# Patient Record
Sex: Female | Born: 1995 | Race: Black or African American | Hispanic: No | Marital: Single | Smoking: Never smoker
Health system: Southern US, Community
[De-identification: ages and names within clinical notes are randomized; demographics above are authoritative.]

## PROBLEM LIST (undated history)

## (undated) ENCOUNTER — Emergency Department (HOSPITAL_COMMUNITY): Admission: EM | Payer: Medicaid Other | Source: Home / Self Care

## (undated) DIAGNOSIS — F329 Major depressive disorder, single episode, unspecified: Secondary | ICD-10-CM

## (undated) DIAGNOSIS — F32A Depression, unspecified: Secondary | ICD-10-CM

## (undated) DIAGNOSIS — F319 Bipolar disorder, unspecified: Secondary | ICD-10-CM

## (undated) DIAGNOSIS — E119 Type 2 diabetes mellitus without complications: Secondary | ICD-10-CM

## (undated) DIAGNOSIS — F209 Schizophrenia, unspecified: Secondary | ICD-10-CM

## (undated) HISTORY — PX: NO PAST SURGERIES: SHX2092

---
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
## 2013-07-25 ENCOUNTER — Encounter (HOSPITAL_COMMUNITY): Payer: Self-pay | Admitting: *Deleted

## 2013-07-25 ENCOUNTER — Emergency Department (HOSPITAL_COMMUNITY)
Admission: EM | Admit: 2013-07-25 | Discharge: 2013-07-25 | Disposition: A | Discharge status: Home / Self Care | Payer: Medicaid Other | Attending: Emergency Medicine | Admitting: Emergency Medicine

## 2013-07-25 DIAGNOSIS — F3289 Other specified depressive episodes: Secondary | ICD-10-CM | POA: Insufficient documentation

## 2013-07-25 DIAGNOSIS — Z79899 Other long term (current) drug therapy: Secondary | ICD-10-CM | POA: Insufficient documentation

## 2013-07-25 DIAGNOSIS — F329 Major depressive disorder, single episode, unspecified: Secondary | ICD-10-CM | POA: Insufficient documentation

## 2013-07-25 DIAGNOSIS — B86 Scabies: Secondary | ICD-10-CM | POA: Insufficient documentation

## 2013-07-25 DIAGNOSIS — R21 Rash and other nonspecific skin eruption: Secondary | ICD-10-CM | POA: Insufficient documentation

## 2013-07-25 HISTORY — DX: Depression, unspecified: F32.A

## 2013-07-25 HISTORY — DX: Major depressive disorder, single episode, unspecified: F32.9

## 2013-07-25 MED ORDER — DIPHENHYDRAMINE HCL 25 MG PO TABS: 25.0000 mg | ORAL_TABLET | Freq: Four times a day (QID) | ORAL | Status: DC | PRN

## 2013-07-25 MED ORDER — PERMETHRIN 5 % EX CREA: TOPICAL_CREAM | CUTANEOUS | Status: DC

## 2013-07-25 MED ORDER — FAMOTIDINE 20 MG PO TABS: 20.0000 mg | ORAL_TABLET | Freq: Two times a day (BID) | ORAL | Status: DC

## 2013-07-25 NOTE — ED Notes (Signed)
Pt. Reported rash started on her hands and feet a couple of weeks ago and has spread, nothing she has taken has helped the rash

## 2013-07-25 NOTE — ED Provider Notes (Signed)
CSN: [id number]     Arrival date & time 07/25/13  1157 History  This chart was scribed for Antonietta Breach, PA, working with Leota Jacobsen, MD by Maree Erie, ED Scribe. This patient was seen in room TR07C/TR07C and the patient's care was started at 12:40 PM.    Chief Complaint  Patient presents with  . Rash    Patient is a 17 y.o. female presenting with rash. The history is provided by the patient. No language interpreter was used.  Rash Location:  Foot, hand, leg and torso Hand rash location:  R hand and L hand Torso rash location:  L chest and R chest Foot rash location:  R foot and L foot Onset quality:  Gradual Duration:  1 month Timing:  Constant Progression:  Worsening Chronicity:  New Context: not new detergent/soap and not sick contacts   Ineffective treatments: new soap. Associated symptoms: no fever     HPI Comments: Cindy Leonard is a 17 y.o. female who presents to the Emergency Department complaining of constant, worsening, itching rash on hands, hips, chest and feet that began about a month ago. She reports it started on her hands but has since spread to her hips, chest and feet. Some of the bumps on her hands have been draining and bleeding after scratching them. She denies taking anything for the rash. She started using a new soap after the rash started but it didn't alleviate the rash. She denies a past medical history of eczema. She denies any mouth lesions or fever. She denies recent use of antibiotics. She denies changing lotions or soaps before the rash. She denies any contacts with similar rashes. She denies sleeping anywhere different recently.   Past Medical History  Diagnosis Date  . Depression    History reviewed. No pertinent past surgical history. No family history on file. History  Substance Use Topics  . Smoking status: Never Smoker   . Smokeless tobacco: Not on file  . Alcohol Use: No   OB History   Grav Para Term Preterm Abortions TAB SAB Ect  Mult Living                 Review of Systems  Constitutional: Negative for fever.  HENT: Negative for mouth sores.   Skin: Positive for rash.  All other systems reviewed and are negative.    Allergies  Review of patient's allergies indicates no known allergies.  Home Medications   Current Outpatient Rx  Name  Route  Sig  Dispense  Refill  . ARIPiprazole (ABILIFY IM)   Intramuscular   Inject 1 application into the muscle every 6 (six) weeks.         . diphenhydrAMINE (BENADRYL) 25 MG tablet   Oral   Take 1 tablet (25 mg total) by mouth every 6 (six) hours as needed for itching (Rash).   30 tablet   0   . famotidine (PEPCID) 20 MG tablet   Oral   Take 1 tablet (20 mg total) by mouth 2 (two) times daily.   30 tablet   0   . permethrin (ELIMITE) 5 % cream      Apply to entire body other than face - let sit for 14 hours then wash off, may repeat in 1 week if still having symptoms   60 g   1    Triage Vitals:BP 131/88  Pulse 80  Temp(Src) 97.1 F (36.2 C) (Oral)  Resp 20  SpO2 97%  LMP 07/18/2013  Physical Exam  Nursing note and vitals reviewed. Constitutional: She is oriented to person, place, and time. She appears well-developed and well-nourished. No distress.  HENT:  Head: Normocephalic and atraumatic.  No oral lesions or papules.   Eyes: Conjunctivae and EOM are normal. No scleral icterus.  Neck: Normal range of motion.  Cardiovascular: Normal rate, regular rhythm and intact distal pulses.   Distal radial pulses 2+ bilaterally.  Pulmonary/Chest: Effort normal. No respiratory distress.  Musculoskeletal: Normal range of motion.  Neurological: She is alert and oriented to person, place, and time.  Skin: Skin is warm and dry. No rash noted. She is not diaphoretic. No erythema. No pallor.  Pruritic, papular, mildly erythematous rash appreciated to the dorsal surface of bilateral feet as well as webbed spaces. Rash also appreciated on dorsal aspect and  interphalangeal spaces of hands. Rash dry without scaling. No heat-to-touch, weeping, or drainage.   Psychiatric: She has a normal mood and affect. Her behavior is normal.    ED Course  Procedures (including critical care time)  DIAGNOSTIC STUDIES: Oxygen Saturation is 97% on room air, adequate by my interpretation.    COORDINATION OF CARE:  12:46 PM -Clinical suspicion of scabies. Patient verbalizes understanding and agrees with treatment plan.   Labs Review Labs Reviewed - No data to display  Imaging Review No results found.  MDM   1. Scabies   2. Rash    17 year old otherwise healthy female presents for rash to her bilateral hands and feet x2 weeks. Patient well and nontoxic appearing, hemodynamically stable, and afebrile. Patient denies new topical contacts, ingestion of foods, and antibiotic use. Physical exam findings as above. No oral lesions appreciated. Rash none concerning for SJS, erythema multiforme major, or erythema multiforme minor. Findings most consistent with scabies. Patient appropriate for discharge with primary care followup. Prescription for Elimite given as well as Benadryl and Pepcid for itching. Return precautions discussed and patient agreeable to plan with no unaddressed concerns.  I personally performed the services described in this documentation, which was scribed in my presence. The recorded information has been reviewed and is accurate.     Antonietta Breach, PA-C 07/25/13 1640

## 2013-07-28 NOTE — ED Provider Notes (Signed)
Medical screening examination/treatment/procedure(s) were performed by non-physician practitioner and as supervising physician I was immediately available for consultation/collaboration.  Toy Baker, MD 07/28/13 2312

## 2014-07-12 ENCOUNTER — Emergency Department: Payer: Self-pay | Admitting: Student

## 2014-10-13 ENCOUNTER — Emergency Department (HOSPITAL_COMMUNITY)
Admission: EM | Admit: 2014-10-13 | Discharge: 2014-10-13 | Disposition: A | Discharge status: Home / Self Care | Payer: Medicaid Other | Attending: Emergency Medicine | Admitting: Emergency Medicine

## 2014-10-13 ENCOUNTER — Encounter (HOSPITAL_COMMUNITY): Payer: Self-pay | Admitting: *Deleted

## 2014-10-13 ENCOUNTER — Emergency Department (HOSPITAL_COMMUNITY): Payer: Medicaid Other

## 2014-10-13 DIAGNOSIS — W1839XA Other fall on same level, initial encounter: Secondary | ICD-10-CM | POA: Diagnosis not present

## 2014-10-13 DIAGNOSIS — Z3202 Encounter for pregnancy test, result negative: Secondary | ICD-10-CM | POA: Diagnosis not present

## 2014-10-13 DIAGNOSIS — F329 Major depressive disorder, single episode, unspecified: Secondary | ICD-10-CM | POA: Insufficient documentation

## 2014-10-13 DIAGNOSIS — Y998 Other external cause status: Secondary | ICD-10-CM | POA: Insufficient documentation

## 2014-10-13 DIAGNOSIS — S6991XA Unspecified injury of right wrist, hand and finger(s), initial encounter: Secondary | ICD-10-CM | POA: Diagnosis present

## 2014-10-13 DIAGNOSIS — Z79899 Other long term (current) drug therapy: Secondary | ICD-10-CM | POA: Insufficient documentation

## 2014-10-13 DIAGNOSIS — S6990XA Unspecified injury of unspecified wrist, hand and finger(s), initial encounter: Secondary | ICD-10-CM

## 2014-10-13 DIAGNOSIS — J069 Acute upper respiratory infection, unspecified: Secondary | ICD-10-CM | POA: Diagnosis not present

## 2014-10-13 DIAGNOSIS — Y9389 Activity, other specified: Secondary | ICD-10-CM | POA: Diagnosis not present

## 2014-10-13 DIAGNOSIS — Y9289 Other specified places as the place of occurrence of the external cause: Secondary | ICD-10-CM | POA: Insufficient documentation

## 2014-10-13 LAB — POC URINE PREG, ED: PREG TEST UR: NEGATIVE

## 2014-10-13 MED ORDER — BENZONATATE 100 MG PO CAPS: 100.0000 mg | ORAL_CAPSULE | Freq: Three times a day (TID) | ORAL | Status: DC

## 2014-10-13 MED ORDER — KETOROLAC TROMETHAMINE 60 MG/2ML IM SOLN
60.0000 mg | Freq: Once | INTRAMUSCULAR | Status: AC
  Administered 2014-10-13: 60 mg via INTRAMUSCULAR
  Filled 2014-10-13: qty 2

## 2014-10-13 NOTE — ED Notes (Signed)
Pt c/o sore throat x 1 week, productive cough, fever yesterday. Denies nausea or vomiting or nasal drainage or headaches.  Injured right hand last night, fell and caught self on palmar aspect of both hands. Right 4th and 5th metacarpals tender and swollen

## 2014-10-13 NOTE — ED Notes (Signed)
Patient transported to X-ray 

## 2014-10-13 NOTE — ED Provider Notes (Signed)
CSN: [id number]     Arrival date & time 10/13/14  1133 History  This chart was scribed for non-physician practitioner, Karmen Stabs, PA-C working with Gerhard Munch, MD, by Jarvis Morgan, ED Scribe. This patient was seen in room TR04C/TR04C and the patient's care was started at 12:49 PM.    Chief Complaint  Patient presents with  . Sore Throat  . Hand Injury    The history is provided by the patient. No language interpreter was used.    HPI Comments: Cindy Leonard is a 18 y.o. female with a h/o depression who presents to the Emergency Department with multiple complaints. Pt states she has had a sore throat for 1 week. Pt states she has has strep throat before and this is what it feels like. She is having associated cough productive of thick, green sputum. She had a fever yesterday but it has now resolved. She denies any nausea, vomiting, rhinorrhea, HA or shortness of breath.   Pt has a second complaint of an injury to her right hand that occurred last night. Pt states she was playing "tag" with her friends yesterday and she fell and she tried to catch herself with her right hand. Pt is having associated swelling to the area specifically in her 4th and 5th finger. She iced it with no relief. No h/o asthma or COPD. She does not smoke tobacco but admits to marijuana use. She denies any numbness, tingling weakness, or chills.   Past Medical History  Diagnosis Date  . Depression    History reviewed. No pertinent past surgical history. History reviewed. No pertinent family history. History  Substance Use Topics  . Smoking status: Never Smoker   . Smokeless tobacco: Not on file  . Alcohol Use: No   OB History    No data available     Review of Systems  Constitutional: Negative for fever and chills.  HENT: Positive for congestion and sore throat. Negative for rhinorrhea.   Respiratory: Positive for cough. Negative for shortness of breath.   Gastrointestinal: Negative for nausea and  vomiting.  Musculoskeletal: Positive for joint swelling (right hand) and arthralgias (R hand).  Skin: Negative for color change.  Neurological: Negative for headaches.      Allergies  Review of patient's allergies indicates no known allergies.  Home Medications   Prior to Admission medications   Medication Sig Start Date End Date Taking? Authorizing Provider  ARIPiprazole (ABILIFY IM) Inject 1 application into the muscle every 6 (six) weeks.    Historical Provider, MD  benzonatate (TESSALON) 100 MG capsule Take 1 capsule (100 mg total) by mouth every 8 (eight) hours. 10/13/14   Louann Sjogren, PA-C  diphenhydrAMINE (BENADRYL) 25 MG tablet Take 1 tablet (25 mg total) by mouth every 6 (six) hours as needed for itching (Rash). 07/25/13   Antony Madura, PA-C  famotidine (PEPCID) 20 MG tablet Take 1 tablet (20 mg total) by mouth 2 (two) times daily. 07/25/13   Antony Madura, PA-C  permethrin (ELIMITE) 5 % cream Apply to entire body other than face - let sit for 14 hours then wash off, may repeat in 1 week if still having symptoms 07/25/13   Antony Madura, PA-C   Triage Vitals: BP 111/69 mmHg  Pulse 86  Temp(Src) 98.7 F (37.1 C) (Oral)  Resp 20  SpO2 97%  Physical Exam  Constitutional: She appears well-developed and well-nourished. No distress.  HENT:  Head: Normocephalic and atraumatic.  Nose: Right sinus exhibits no maxillary sinus tenderness  and no frontal sinus tenderness. Left sinus exhibits no maxillary sinus tenderness and no frontal sinus tenderness.  Mouth/Throat: Mucous membranes are normal. Posterior oropharyngeal erythema present. No oropharyngeal exudate or posterior oropharyngeal edema.  Eyes: Conjunctivae and EOM are normal. Right eye exhibits no discharge. Left eye exhibits no discharge.  Neck: Normal range of motion. Neck supple.  Cardiovascular: Normal rate, regular rhythm and normal heart sounds.   Pulses:      Radial pulses are 2+ on the right side, and 2+ on the left  side.  < 3 capillary refill  Pulmonary/Chest: Effort normal and breath sounds normal. No respiratory distress. She has no wheezes. She has no rales.  Abdominal: Soft. Bowel sounds are normal. She exhibits no distension. There is no tenderness.  Musculoskeletal:  Mild swelling and erythema to right dorsal hand, distal to wrist with tenderness No swelling, erythema or tenderness to fingers  Lymphadenopathy:    She has no cervical adenopathy.  Neurological: She is alert.  Skin: Skin is warm and dry. She is not diaphoretic.  Nursing note and vitals reviewed.   ED Course  Procedures (including critical care time)  DIAGNOSTIC STUDIES: Oxygen Saturation is 97% on RA, normal by my interpretation.    COORDINATION OF CARE:    Labs Review Labs Reviewed  POC URINE PREG, ED    Imaging Review Dg Chest 2 View  10/13/2014   CLINICAL DATA:  18 year old female with 1 week history of productive cough. Recent fall.  EXAM: CHEST  2 VIEW  COMPARISON:  None.  FINDINGS: The lungs are clear and negative for focal airspace consolidation, pulmonary edema or suspicious pulmonary nodule. No pleural effusion or pneumothorax. Cardiac and mediastinal contours are within normal limits. No acute fracture or lytic or blastic osseous lesions. The visualized upper abdominal bowel gas pattern is unremarkable.  IMPRESSION: Negative chest x-ray.   Electronically Signed   By: Malachy MoanHeath  McCullough M.D.   On: 10/13/2014 13:53   Dg Hand Complete Right  10/13/2014   CLINICAL DATA:  Patient was running backwards and tripped, tried to break the fall with her right hand. Dorsal right hand pain and swelling. Initial encounter.  EXAM: RIGHT HAND - COMPLETE 3+ VIEW  COMPARISON:  None.  FINDINGS: Mild soft tissue swelling. No evidence of acute fracture or dislocation. Joint spaces well preserved. Well-preserved bone mineral density. No intrinsic osseous abnormalities.  IMPRESSION: No osseous abnormality.   Electronically Signed   By:  Hulan Saashomas  Lawrence M.D.   On: 10/13/2014 13:53     EKG Interpretation None      Meds given in ED:  Medications  ketorolac (TORADOL) injection 60 mg (60 mg Intramuscular Given 10/13/14 1330)    Discharge Medication List as of 10/13/2014  2:01 PM    START taking these medications   Details  benzonatate (TESSALON) 100 MG capsule Take 1 capsule (100 mg total) by mouth every 8 (eight) hours., Starting 10/13/2014, Until Discontinued, Print          MDM   Final diagnoses:  Hand injury  URI (upper respiratory infection)   Pt CXR negative for acute infiltrate. I doubt strep throat due and will not obtain strep screen due to low probability with [facility] criteria. Patients symptoms are consistent with URI, likely viral etiology. Discussed that antibiotics are not indicated for viral infections. Pt will be discharged with symptomatic treatment as well as a cough suppressant.  Verbalizes understanding and is agreeable with plan. Pt is hemodynamically stable & in NAD prior to  dc.  Patient X-Ray negative for obvious fracture or dislocation. Pain managed in ED. Pt advised to follow up with PCP/wellness center for persistent symptoms. Patient given brace while in ED, conservative therapy recommended and discussed. Patient will be dc home & is agreeable with above plan.  Discussed return precautions with patient. Discussed all results and patient verbalizes understanding and agrees with plan.  I personally performed the services described in this documentation, which was scribed in my presence. The recorded information has been reviewed and is accurate.   Louann SjogrenVictoria L Shenouda Genova, PA-C 10/13/14 1814  Gerhard Munchobert Lockwood, MD 10/14/14 [phone]

## 2014-10-13 NOTE — Discharge Instructions (Signed)
Return to the emergency room with worsening of symptoms, new symptoms or with symptoms that are concerning. , especially fevers, stiff neck, worsening headache, nausea/vomiting, visual changes or slurred speech, chest pain, shortness of breath, cough with thick colored mucous or blood Drink plenty of fluids with electrolytes especially Gatorade. OTC cold medications such as mucinex, nyquil, dayquil are recommended. Chloraseptic for sore throat. RICE: Rest, Ice (three cycles of 20 mins on, 20mins off at least twice a [place]), compression/brace, elevation. Heating pad works well for back pain. Ibuprofen 400mg  (2 tablets 200mg ) every 5-6 hours for 3-5 days and then as needed for pain. Follow up with PCP if symptoms worsen or are persistent.   Cough, Adult  A cough is a reflex that helps clear your throat and airways. It can help heal the body or may be a reaction to an irritated airway. A cough may only last 2 or 3 weeks (acute) or may last more than 8 weeks (chronic).  CAUSES Acute cough:  Viral or bacterial infections. Chronic cough:  Infections.  Allergies.  Asthma.  Post-nasal drip.  Smoking.  Heartburn or acid reflux.  Some medicines.  Chronic lung problems (COPD).  Cancer. SYMPTOMS   Cough.  Fever.  Chest pain.  Increased breathing rate.  High-pitched whistling sound when breathing (wheezing).  Colored mucus that you cough up (sputum). TREATMENT   A bacterial cough may be treated with antibiotic medicine.  A viral cough must run its course and will not respond to antibiotics.  Your caregiver may recommend other treatments if you have a chronic cough. HOME CARE INSTRUCTIONS   Only take over-the-counter or prescription medicines for pain, discomfort, or fever as directed by your caregiver. Use cough suppressants only as directed by your caregiver.  Use a cold steam vaporizer or humidifier in your bedroom or home to help loosen secretions.  Sleep in a  semi-upright position if your cough is worse at night.  Rest as needed.  Stop smoking if you smoke. SEEK IMMEDIATE MEDICAL CARE IF:   You have pus in your sputum.  Your cough starts to worsen.  You cannot control your cough with suppressants and are losing sleep.  You begin coughing up blood.  You have difficulty breathing.  You develop pain which is getting worse or is uncontrolled with medicine.  You have a fever. MAKE SURE YOU:   Understand these instructions.  Will watch your condition.  Will get help right away if you are not doing well or get worse. Document Released: 04/17/2011 Document Revised: 01/11/2012 Document Reviewed: 04/17/2011 [facility] Patient Information 2015 [employer] [place]. This information is not intended to replace advice given to you by your health care provider. Make sure you discuss any questions you have with your health care provider.

## 2016-07-23 ENCOUNTER — Emergency Department (HOSPITAL_COMMUNITY)
Admission: EM | Admit: 2016-07-23 | Discharge: 2016-07-23 | Disposition: A | Discharge status: Home / Self Care | Payer: Medicaid Other | Attending: Emergency Medicine | Admitting: Emergency Medicine

## 2016-07-23 ENCOUNTER — Encounter (HOSPITAL_COMMUNITY): Payer: Self-pay | Admitting: Emergency Medicine

## 2016-07-23 DIAGNOSIS — Z79899 Other long term (current) drug therapy: Secondary | ICD-10-CM | POA: Insufficient documentation

## 2016-07-23 DIAGNOSIS — R112 Nausea with vomiting, unspecified: Secondary | ICD-10-CM | POA: Diagnosis not present

## 2016-07-23 DIAGNOSIS — R197 Diarrhea, unspecified: Secondary | ICD-10-CM | POA: Insufficient documentation

## 2016-07-23 DIAGNOSIS — R1084 Generalized abdominal pain: Secondary | ICD-10-CM | POA: Diagnosis present

## 2016-07-23 LAB — COMPREHENSIVE METABOLIC PANEL
ALT: 33 U/L (ref 14–54)
ANION GAP: 7 (ref 5–15)
AST: 23 U/L (ref 15–41)
Albumin: 4.1 g/dL (ref 3.5–5.0)
Alkaline Phosphatase: 64 U/L (ref 38–126)
BILIRUBIN TOTAL: 0.4 mg/dL (ref 0.3–1.2)
BUN: 22 mg/dL — ABNORMAL HIGH (ref 6–20)
CO2: 27 mmol/L (ref 22–32)
Calcium: 9.9 mg/dL (ref 8.9–10.3)
Chloride: 108 mmol/L (ref 101–111)
Creatinine, Ser: 1.21 mg/dL — ABNORMAL HIGH (ref 0.44–1.00)
GFR calc Af Amer: >60 mL/min (ref >60)
Glucose, Bld: 98 mg/dL (ref 65–99)
POTASSIUM: 4 mmol/L (ref 3.5–5.1)
Sodium: 142 mmol/L (ref 135–145)
TOTAL PROTEIN: 7.9 g/dL (ref 6.5–8.1)

## 2016-07-23 LAB — URINALYSIS, ROUTINE W REFLEX MICROSCOPIC
Bilirubin Urine: NEGATIVE
Glucose, UA: NEGATIVE mg/dL
Hgb urine dipstick: NEGATIVE
Ketones, ur: NEGATIVE mg/dL
NITRITE: NEGATIVE
PH: 5.5 (ref 5.0–8.0)
Protein, ur: NEGATIVE mg/dL
SPECIFIC GRAVITY, URINE: 1.02 (ref 1.005–1.030)

## 2016-07-23 LAB — CBC
HEMATOCRIT: 39.9 % (ref 36.0–46.0)
HEMOGLOBIN: 13.4 g/dL (ref 12.0–15.0)
MCH: 27.7 pg (ref 26.0–34.0)
MCHC: 33.6 g/dL (ref 30.0–36.0)
MCV: 82.6 fL (ref 78.0–100.0)
Platelets: 332 10*3/uL (ref 150–400)
RBC: 4.83 MIL/uL (ref 3.87–5.11)
RDW: 13.8 % (ref 11.5–15.5)
WBC: 11.9 10*3/uL — AB (ref 4.0–10.5)

## 2016-07-23 LAB — URINE MICROSCOPIC-ADD ON: RBC / HPF: NONE SEEN RBC/hpf (ref 0–5)

## 2016-07-23 LAB — POC URINE PREG, ED: Preg Test, Ur: NEGATIVE

## 2016-07-23 LAB — LIPASE, BLOOD: Lipase: 37 U/L (ref 11–51)

## 2016-07-23 MED ORDER — ONDANSETRON 4 MG PO TBDP: 4.0000 mg | ORAL_TABLET | Freq: Three times a day (TID) | ORAL | 0 refills | Status: DC | PRN

## 2016-07-23 MED ORDER — ONDANSETRON 4 MG PO TBDP
4.0000 mg | ORAL_TABLET | Freq: Once | ORAL | Status: AC | PRN
  Administered 2016-07-23: 4 mg via ORAL
  Filled 2016-07-23: qty 1

## 2016-07-23 MED ORDER — DIPHENOXYLATE-ATROPINE 2.5-0.025 MG PO TABS: 1.0000 | ORAL_TABLET | Freq: Four times a day (QID) | ORAL | 0 refills | Status: DC | PRN

## 2016-07-23 NOTE — ED Triage Notes (Signed)
Pt from home with c/o abdominal pain on her left upper quadrant, pt also has c/o emesis x 3 and diarrhea x 2. Pt has a low grade fever of 99.3 at time of assessment. Pt states symptoms have been present x 1 week

## 2016-07-23 NOTE — ED Notes (Signed)
Pt's family members came to desk asking for assistance in bathroom Pt was on the floor Pt sat on floor because "she's hot." Pt able to stand and walk back out to lobby w/o assistance Pt advised to remove extra clothing Pt given ice-pack to help cool herself

## 2016-07-23 NOTE — Discharge Instructions (Signed)
You were seen in the emergency room for evaluation of abdominal pain, nausea, vomiting, and diarrhea. You likely have a virus. Follow up with your primary care provider as soon as possible for a follow up. If you do not have one you should have an assigned primary care provider listed on your medicaid card. Take medication as prescribed as needed. Drink plenty of water to stay hydrated. Return to the ER for new or worsening symptoms.

## 2016-07-23 NOTE — ED Provider Notes (Signed)
[facility]-EMERGENCY DEPT Provider Note   CSN: [id number] Arrival date & time: 07/23/16  1947   History   Chief Complaint Chief Complaint  Patient presents with  . Emesis  . Abdominal Pain  . Diarrhea   HPI  Cindy Leonard is an 20 y.o. female with no significant PMH who presents to the ED for evaluation of abdominal pain, nausea, vomiting, and diarrhea. She states her symptoms started about one week ago. She states she has diffuse abdominal cramping pain. States she has been nauseated and has thrown up twice over the past week. She states each night she has at least one episode of loose watery non bloody stools, sometimes another one or two over the course of the [place]. She has been able to keep water and food down. She is hungry in the ED. She states a few times she has felt very hot and think she has a fever but is not sure. Denies dysuria or increased urinary frequency/urgency. States she went to [place] [place] over [place] weekend otherwise denies recent travel. Denies sick contacts. Has not tried anything for the pain.  Past Medical History:  Diagnosis Date  . Depression     There are no active problems to display for this patient.   History reviewed. No pertinent surgical history.  OB History    No data available       Home Medications    Prior to Admission medications   Medication Sig Start Date End Date Taking? Authorizing Provider  diphenhydrAMINE (BENADRYL) 25 MG tablet Take 1 tablet (25 mg total) by mouth every 6 (six) hours as needed for itching (Rash). Patient taking differently: Take 25 mg by mouth every 6 (six) hours as needed for itching, allergies or sleep.  07/25/13  Yes Antony Madura, PA-C  diphenoxylate-atropine (LOMOTIL) 2.5-0.025 MG tablet Take 1 tablet by mouth 4 (four) times daily as needed for diarrhea or loose stools. 07/23/16   Ace Gins Ahmadou Bolz, PA-C  ondansetron (ZOFRAN ODT) 4 MG disintegrating tablet Take 1 tablet (4 mg total) by mouth every 8 (eight) hours as  needed for nausea or vomiting. 07/23/16   Carlene Coria, PA-C    Family History No family history on file.  Social History Social History  Substance Use Topics  . Smoking status: Never Smoker  . Smokeless tobacco: Never Used  . Alcohol use No     Allergies   Review of patient's allergies indicates no known allergies.   Review of Systems Review of Systems  All other systems reviewed and are negative.    Physical Exam Updated Vital Signs BP 123/84 (BP Location: Left Arm)   Pulse 78   Temp 99.3 F (37.4 C) (Oral)   Resp 20   Ht 5\' 5"  (1.651 m)   Wt (!) 148.3 kg   SpO2 98%   BMI 54.42 kg/m   Physical Exam  Constitutional: She is oriented to person, place, and time.  Obese, NAD  HENT:  Right Ear: External ear normal.  Left Ear: External ear normal.  Nose: Nose normal.  Mouth/Throat: Oropharynx is clear and moist. No oropharyngeal exudate.  Eyes: Conjunctivae are normal.  Neck: Neck supple.  Cardiovascular: Normal rate, regular rhythm, normal heart sounds and intact distal pulses.   Pulmonary/Chest: Effort normal and breath sounds normal. No respiratory distress. She has no wheezes.  Abdominal: Soft. Bowel sounds are normal. She exhibits no distension. There is no tenderness. There is no rebound and no guarding.  Musculoskeletal: She exhibits no  edema.  Lymphadenopathy:    She has no cervical adenopathy.  Neurological: She is alert and oriented to person, place, and time. No cranial nerve deficit.  Skin: Skin is warm and dry.  Psychiatric: She has a normal mood and affect.  Nursing note and vitals reviewed.    ED Treatments / Results  Labs (all labs ordered are listed, but only abnormal results are displayed) Labs Reviewed  COMPREHENSIVE METABOLIC PANEL - Abnormal; Notable for the following:       Result Value   BUN 22 (*)    Creatinine, Ser 1.21 (*)    All other components within normal limits  CBC - Abnormal; Notable for the following:    WBC 11.9 (*)     All other components within normal limits  URINALYSIS, ROUTINE W REFLEX MICROSCOPIC (NOT [facility]) - Abnormal; Notable for the following:    APPearance CLOUDY (*)    Leukocytes, UA SMALL (*)    All other components within normal limits  URINE MICROSCOPIC-ADD ON - Abnormal; Notable for the following:    Squamous Epithelial / LPF 6-30 (*)    Bacteria, UA MANY (*)    All other components within normal limits  URINE CULTURE  LIPASE, BLOOD  POC URINE PREG, ED    EKG  EKG Interpretation None       Radiology No results found.  Procedures Procedures (including critical care time)  Medications Ordered in ED Medications  ondansetron (ZOFRAN-ODT) disintegrating tablet 4 mg (4 mg Oral Given 07/23/16 2030)     Initial Impression / Assessment and Plan / ED Course  I have reviewed the triage vital signs and the nursing notes.  Pertinent labs & imaging results that were available during my care of the patient were reviewed by me and considered in my medical decision making (see chart for details).  Clinical Course    Pt feels well in the room. She is hungry. Denies nausea. Denies abdominal pain. She has a nonfocal exam. She has some renal insufficiency on labs today. Unclear of baseline. Instructed pt to f/u with pcp for re-check. She has a UA with 6-30 WBC, many bacteria. Also evidence of contamination. Pt denies urinary symptoms. She has no CVA tenderness. Repeat temp in room is 98.11F. Will hold off on antibiotic therapy given asymptomatic bacturia. Will send for culture. In the meantime will give rx for supportive meds. Suspect viral etiology. Doubt appendicitis, cholecystitis, SBO, diverticulitis, or other intra-abdominal etiology requiring further workup and imaging. Pt nontoxic appearing and tolerating PO. Resource given to assist in establishing PCP. ER return precautions given.  Final Clinical Impressions(s) / ED Diagnoses   Final diagnoses:  Nausea vomiting and diarrhea     New Prescriptions New Prescriptions   DIPHENOXYLATE-ATROPINE (LOMOTIL) 2.5-0.025 MG TABLET    Take 1 tablet by mouth 4 (four) times daily as needed for diarrhea or loose stools.   ONDANSETRON (ZOFRAN ODT) 4 MG DISINTEGRATING TABLET    Take 1 tablet (4 mg total) by mouth every 8 (eight) hours as needed for nausea or vomiting.     Carlene CoriaSerena Y Tarun Patchell, PA-C 07/23/16 2319    Melene Planan Floyd, DO 07/23/16 2320

## 2016-07-23 NOTE — ED Notes (Signed)
Bed: WA08 Expected date:  Expected time:  Means of arrival:  Comments: Cleaning

## 2016-07-23 NOTE — ED Notes (Signed)
While in the lobby, pt's family asked many times when patient would get back to a room. ED staff gave the best estimated answered and explained the process of the ED. Then pt's mother told the pt, "they will care about you when you fall out." Pt had stable vitals and is ambulatory with no assistance.

## 2016-07-26 LAB — URINE CULTURE: Culture: >=100000 — AB

## 2016-07-27 ENCOUNTER — Telehealth (HOSPITAL_BASED_OUTPATIENT_CLINIC_OR_DEPARTMENT_OTHER): Payer: Self-pay | Admitting: Emergency Medicine

## 2016-07-27 NOTE — Telephone Encounter (Signed)
Post ED Visit - Positive Culture Follow-up  Culture report reviewed by antimicrobial stewardship pharmacist:  []  Enzo BiNathan Batchelder, Pharm.D. []  Celedonio MiyamotoJeremy Frens, Pharm.D., BCPS []  Garvin FilaMike Maccia, Pharm.D. []  Georgina PillionElizabeth Martin, Pharm.D., BCPS []  EmpireMinh Pham, [address] Rainbow BoulevardPharm.D., BCPS, AAHIVP []  Estella HuskMichelle Turner, Pharm.D., BCPS, AAHIVP []  Tennis Mustassie Stewart, Pharm.D. []  Sherle Poeob Vincent, [place]. Casilda Carlsaylor Stone PharmD  Positive urine culture Treated with none, asymptomatic,  no further patient follow-up is required at this time.  Berle MullMiller, Jamel Dunton 07/27/2016, 10:47 AM

## 2016-12-23 ENCOUNTER — Encounter (HOSPITAL_COMMUNITY): Payer: Self-pay

## 2016-12-23 ENCOUNTER — Emergency Department (HOSPITAL_COMMUNITY)
Admission: EM | Admit: 2016-12-23 | Discharge: 2016-12-23 | Disposition: A | Discharge status: Home / Self Care | Payer: Medicaid Other | Attending: Emergency Medicine | Admitting: Emergency Medicine

## 2016-12-23 DIAGNOSIS — N939 Abnormal uterine and vaginal bleeding, unspecified: Secondary | ICD-10-CM | POA: Diagnosis not present

## 2016-12-23 HISTORY — DX: Schizophrenia, unspecified: F20.9

## 2016-12-23 HISTORY — DX: Bipolar disorder, unspecified: F31.9

## 2016-12-23 LAB — WET PREP, GENITAL
Clue Cells Wet Prep HPF POC: NONE SEEN
SPERM: NONE SEEN
TRICH WET PREP: NONE SEEN
Yeast Wet Prep HPF POC: NONE SEEN

## 2016-12-23 LAB — CBC
HCT: 40.6 % (ref 36.0–46.0)
Hemoglobin: 13.5 g/dL (ref 12.0–15.0)
MCH: 27.3 pg (ref 26.0–34.0)
MCHC: 33.3 g/dL (ref 30.0–36.0)
MCV: 82.2 fL (ref 78.0–100.0)
PLATELETS: 324 10*3/uL (ref 150–400)
RBC: 4.94 MIL/uL (ref 3.87–5.11)
RDW: 14.5 % (ref 11.5–15.5)
WBC: 10.1 10*3/uL (ref 4.0–10.5)

## 2016-12-23 NOTE — ED Provider Notes (Signed)
[facility]-EMERGENCY DEPT Provider Note   CSN: [id number] Arrival date & time: 12/23/16  1538     History   Chief Complaint Chief Complaint  Patient presents with  . Vaginal Bleeding    HPI Cindy Leonard is a 21 y.o. female who presents to the ED with complaint of vaginal bleeding during sexual intercourse x one month. Patient reports no bleeding other than with sex. She denies pain with intercourse. She denies n/v/d. She denies having had STI's. Last pap smear less than one year ago at the health department and was normal. She denies any other problems today.  The history is provided by the patient. No language interpreter was used.  Vaginal Bleeding  Primary symptoms include vaginal bleeding.  Primary symptoms include no dysuria. There has been no fever. The current episode started more than 1 week ago. Episode frequency: only with intercourse. The symptoms occur after intercourse. She is not pregnant. Pertinent negatives include no abdominal swelling, no abdominal pain, no diarrhea, no nausea, no vomiting and no frequency. She has tried nothing for the symptoms. Sexual activity: sexually active. There is no concern regarding sexually transmitted diseases. She uses nothing for contraception.    Past Medical History:  Diagnosis Date  . Bipolar 1 disorder (HCC)   . Depression   . Schizophrenia (HCC)     There are no active problems to display for this patient.   History reviewed. No pertinent surgical history.  OB History    No data available       Home Medications    Prior to Admission medications   Medication Sig Start Date End Date Taking? Authorizing Provider  diphenoxylate-atropine (LOMOTIL) 2.5-0.025 MG tablet Take 1 tablet by mouth 4 (four) times daily as needed for diarrhea or loose stools. 07/23/16   Ace GinsSerena Y Sam, PA-C  ondansetron (ZOFRAN ODT) 4 MG disintegrating tablet Take 1 tablet (4 mg total) by mouth every 8 (eight) hours as needed for nausea or vomiting.  07/23/16   Carlene CoriaSerena Y Sam, PA-C    Family History History reviewed. No pertinent family history.  Social History Social History  Substance Use Topics  . Smoking status: Never Smoker  . Smokeless tobacco: Never Used  . Alcohol use No     Allergies   Patient has no known allergies.   Review of Systems Review of Systems  Constitutional: Negative for chills and fever.  HENT: Negative for congestion and sore throat.   Respiratory: Negative for cough and shortness of breath.   Gastrointestinal: Negative for abdominal pain, diarrhea, nausea and vomiting.  Genitourinary: Positive for vaginal bleeding. Negative for dysuria, frequency and vaginal discharge.  Musculoskeletal: Negative for back pain.  Skin: Negative for rash.  Neurological: Negative for headaches.  Psychiatric/Behavioral: Negative for sleep disturbance.     Physical Exam Updated Vital Signs BP 139/91   Pulse 75   Temp 97.8 F (36.6 C) (Oral)   Resp 18   Ht 5\' 9"  (1.753 m)   Wt (!) 152 kg   LMP 12/06/2016 (Exact Date)   SpO2 100%   BMI 49.47 kg/m   Physical Exam  Constitutional: She is oriented to person, place, and time. No distress.  Morbidly Obese female with Elevated BP  HENT:  Head: Normocephalic.  Eyes: EOM are normal.  Neck: Neck supple.  Cardiovascular: Normal rate and regular rhythm.   Pulmonary/Chest: Effort normal. She has no wheezes. She has no rales.  Abdominal: Soft. Bowel sounds are normal. There is no tenderness.  Genitourinary:  Genitourinary Comments: External genitalia without lesions, vaginal vault with scant blood, clean vault with fox swab and no laceration or bleeding from vaginal wall noted. Cervix without lesions, no CMT, no adnexal tenderness, unable to palpate uterus due to patient habitus.   Musculoskeletal: Normal range of motion.  Neurological: She is alert and oriented to person, place, and time. No cranial nerve deficit.  Skin: Skin is warm and dry.  Psychiatric: She has a  normal mood and affect. Her behavior is normal.  Nursing note and vitals reviewed.    ED Treatments / Results  Labs (all labs ordered are listed, but only abnormal results are displayed) Labs Reviewed  WET PREP, GENITAL - Abnormal; Notable for the following:       Result Value   WBC, Wet Prep HPF POC FEW (*)    All other components within normal limits  CBC  RPR  HIV ANTIBODY (ROUTINE TESTING)  POC URINE PREG, ED  GC/CHLAMYDIA PROBE AMP ([place]) NOT AT [facility]  Procedures Procedures (including critical care time)  Medications Ordered in ED Medications - No data to display   Initial Impression / Assessment and Plan / ED Course  I have reviewed the triage vital signs and the nursing notes.  Pertinent lab results that were available during my care of the patient were reviewed by me and considered in my medical decision making (see chart for details).   Final Clinical Impressions(s) / ED Diagnoses  21 y.o. female with bleeding during sexual intercourse stable for d/c without hemorrhage, fever, abdominal pain or other problems. I discussed with the patient bleeding possibly due to trama to the cervix during intercourse and she states that is what they told her when she went to the health department about it. Discussed that there are no visible lacerations to the cervix or vaginal wall. Cultures pending. Patient to return to health department for routine care. She will go to [facility] if symptoms worsen. She will also f/u for elevated BP. Final diagnoses:  Vaginal bleeding    New Prescriptions Current Discharge Medication List       [facility], NP 12/23/16 1745    Arby Barrette, MD 12/24/16 [phone]

## 2016-12-23 NOTE — ED Triage Notes (Signed)
Pt endorses "Every time I have sex with my boyfriend I began having vaginal bleeding" Denies clots. Pt states that "It bleeds for a short time and then it goes away" VSS. Denies pain.

## 2016-12-23 NOTE — Discharge Instructions (Signed)
We did not find a reason for your bleeding today. It is important that you follow up with the health department or with your GYN for additional evaluation of your bleeding. If your symptoms worsen, you experience heavy bleeding, abdominal pain or other problems [facility].

## 2016-12-24 LAB — GC/CHLAMYDIA PROBE AMP (~~LOC~~) NOT AT ARMC
CHLAMYDIA, DNA PROBE: NEGATIVE
Neisseria Gonorrhea: NEGATIVE

## 2016-12-24 LAB — RPR: RPR: NONREACTIVE

## 2016-12-24 LAB — HIV ANTIBODY (ROUTINE TESTING W REFLEX): HIV SCREEN 4TH GENERATION: NONREACTIVE

## 2017-02-08 ENCOUNTER — Encounter (HOSPITAL_COMMUNITY): Payer: Self-pay | Admitting: Emergency Medicine

## 2017-02-08 ENCOUNTER — Emergency Department (HOSPITAL_COMMUNITY)
Admission: EM | Admit: 2017-02-08 | Discharge: 2017-02-08 | Disposition: A | Discharge status: Home / Self Care | Payer: Medicaid Other | Attending: Emergency Medicine | Admitting: Emergency Medicine

## 2017-02-08 DIAGNOSIS — N938 Other specified abnormal uterine and vaginal bleeding: Secondary | ICD-10-CM | POA: Insufficient documentation

## 2017-02-08 DIAGNOSIS — N939 Abnormal uterine and vaginal bleeding, unspecified: Secondary | ICD-10-CM | POA: Diagnosis present

## 2017-02-08 DIAGNOSIS — Z79899 Other long term (current) drug therapy: Secondary | ICD-10-CM | POA: Insufficient documentation

## 2017-02-08 LAB — WET PREP, GENITAL
CLUE CELLS WET PREP: NONE SEEN
Sperm: NONE SEEN
TRICH WET PREP: NONE SEEN
YEAST WET PREP: NONE SEEN

## 2017-02-08 LAB — I-STAT BETA HCG BLOOD, ED (MC, WL, AP ONLY)

## 2017-02-08 MED ORDER — MEDROXYPROGESTERONE ACETATE 5 MG PO TABS: 5.0000 mg | ORAL_TABLET | Freq: Every day | ORAL | 0 refills | Status: DC

## 2017-02-08 MED ORDER — IBUPROFEN 600 MG PO TABS: 600.0000 mg | ORAL_TABLET | Freq: Three times a day (TID) | ORAL | 0 refills | Status: AC

## 2017-02-08 NOTE — ED Triage Notes (Signed)
Pt states she is having vaginal bleeding and lower abd pain  Pt states she had a normal period March 5th but this month it is a few days late, heavy with clots  Pt states she has gone through 4 pads in the past 12 hours

## 2017-02-08 NOTE — ED Provider Notes (Signed)
[facility]-EMERGENCY DEPT Provider Note   CSN: [id number] Arrival date & time: 02/08/17  1901     History   Chief Complaint Chief Complaint  Patient presents with  . Vaginal Bleeding    HPI Cindy Leonard is a 21 y.o. female.  The history is provided by the patient.  Vaginal Bleeding  Primary symptoms include vaginal bleeding.  Primary symptoms include no discharge, no pelvic pain, no genital pain, no genital rash, no dysuria. There has been no fever. This is a new problem. Episode onset: One month ago. The problem occurs constantly. The problem has not changed since onset.The symptoms occur spontaneously and during menstruation. She is not pregnant. She has not missed her period. Her LMP was days ago. The patient's menstrual history has been regular. The discharge was normal. Associated symptoms include abdominal pain. Pertinent negatives include no diarrhea, no nausea, no vomiting, no light-headedness and no dizziness. She has tried nothing for the symptoms. Sexual activity: sexually active. She uses nothing for contraception. Associated medical issues do not include STD or PID.    Past Medical History:  Diagnosis Date  . Bipolar 1 disorder (HCC)   . Depression   . Schizophrenia (HCC)     There are no active problems to display for this patient.   History reviewed. No pertinent surgical history.  OB History    No data available       Home Medications    Prior to Admission medications   Medication Sig Start Date End Date Taking? Authorizing Provider  ibuprofen (ADVIL,MOTRIN) 600 MG tablet Take 1 tablet (600 mg total) by mouth every 8 (eight) hours. 02/08/17 02/13/17  Shaune Pollack, MD  medroxyPROGESTERone (PROVERA) 5 MG tablet Take 1 tablet (5 mg total) by mouth daily. 02/08/17 02/18/17  Shaune Pollack, MD    Family History Family History  Problem Relation Age of Onset  . Stroke Mother   . Diabetes Other   . Hypertension Other     Social History Social History    Substance Use Topics  . Smoking status: Never Smoker  . Smokeless tobacco: Never Used  . Alcohol use No     Allergies   Food   Review of Systems Review of Systems  Constitutional: Negative for chills and fever.  HENT: Negative for congestion, rhinorrhea and sore throat.   Eyes: Negative for visual disturbance.  Respiratory: Negative for cough, shortness of breath and wheezing.   Cardiovascular: Negative for chest pain and leg swelling.  Gastrointestinal: Positive for abdominal pain. Negative for diarrhea, nausea and vomiting.  Genitourinary: Positive for vaginal bleeding. Negative for dysuria, flank pain, pelvic pain, vaginal discharge and vaginal pain.  Musculoskeletal: Negative for neck pain.  Skin: Negative for rash.  Allergic/Immunologic: Negative for immunocompromised state.  Neurological: Negative for dizziness, syncope, light-headedness and headaches.  Hematological: Does not bruise/bleed easily.  All other systems reviewed and are negative.    Physical Exam Updated Vital Signs BP 137/87 (BP Location: Left Arm)   Pulse 71   Temp 98 F (36.7 C) (Oral)   Resp 18   Ht  (1.753 m)   Wt 235 lb (106.6 kg)   LMP 01/04/2017 (Exact Date)   SpO2 100%   BMI 34.70 kg/m   Physical Exam  Constitutional: She is oriented to person, place, and time. She appears well-developed and well-nourished. No distress.  HENT:  Head: Normocephalic and atraumatic.  Eyes: Conjunctivae are normal.  Neck: Neck supple.  Cardiovascular: Normal rate, regular rhythm and normal heart  sounds.  Exam reveals no friction rub.   No murmur heard. Pulmonary/Chest: Effort normal and breath sounds normal. No respiratory distress. She has no wheezes. She has no rales.  Abdominal: She exhibits no distension.  Genitourinary: Uterus normal. Pelvic exam was performed with patient supine. Cervix exhibits no motion tenderness, no discharge and no friability. Right adnexum displays no tenderness and no  fullness. Left adnexum displays no tenderness and no fullness. There is bleeding (small volume dark red blood, os closed) in the vagina. No erythema or tenderness in the vagina. No vaginal discharge found.  Musculoskeletal: She exhibits no edema.  Neurological: She is alert and oriented to person, place, and time. She exhibits normal muscle tone.  Skin: Skin is warm. Capillary refill takes less than 2 seconds.  Psychiatric: She has a normal mood and affect.  Nursing note and vitals reviewed.    ED Treatments / Results  Labs (all labs ordered are listed, but only abnormal results are displayed) Labs Reviewed  WET PREP, GENITAL - Abnormal; Notable for the following:       Result Value   WBC, Wet Prep HPF POC FEW (*)    All other components within normal limits  I-STAT BETA HCG BLOOD, ED ([facility], [facility], [facility] ONLY)  GC/CHLAMYDIA PROBE AMP ([place]) NOT AT [facility]    EKG  EKG Interpretation None       Radiology No results found.  Procedures Procedures (including critical care time)  Medications Ordered in ED Medications - No data to display   Initial Impression / Assessment and Plan / ED Course  I have reviewed the triage vital signs and the nursing notes.  Pertinent labs & imaging results that were available during my care of the patient were reviewed by me and considered in my medical decision making (see chart for details).    21 yo obese AAF here with intermittent, irregular vaginal bleeding. Suspect DUB, anovulatory bleeding, possibly 2/2 PCOS given hirsutism on exam, obesity. UPT neg. No evidence of active bleeding. Exam and wet prep not c/w PID. No adnexal pain or TTP. Discussed management options - pt would like to trial OCPs. Will give 10day course of progestin OCP, NSAIDs, d/c. No personal or family h/o DVT/PE but given obesity, will refer back to [facility] for initiation of combined estrogen OCPs if indicated.  Final Clinical Impressions(s) / ED Diagnoses   Final diagnoses:    DUB (dysfunctional uterine bleeding)    New Prescriptions Discharge Medication List as of 02/08/2017 11:27 PM    START taking these medications   Details  ibuprofen (ADVIL,MOTRIN) 600 MG tablet Take 1 tablet (600 mg total) by mouth every 8 (eight) hours., Starting Mon 02/08/2017, Until Sat 02/13/2017, Print    medroxyPROGESTERone (PROVERA) 5 MG tablet Take 1 tablet (5 mg total) by mouth daily., Starting Mon 02/08/2017, Until Thu 02/18/2017, Print         Shaune Pollack, MD 02/09/17 [phone]

## 2017-02-08 NOTE — ED Notes (Signed)
PT DISCHARGED. INSTRUCTIONS AND PRESCRIPTIONS GIVEN. AAOX4. PT IN NO APPARENT DISTRESS OR PAIN. THE OPPORTUNITY TO ASK QUESTIONS WAS PROVIDED. 

## 2017-02-09 LAB — GC/CHLAMYDIA PROBE AMP (~~LOC~~) NOT AT ARMC
Chlamydia: NEGATIVE
Neisseria Gonorrhea: NEGATIVE

## 2017-03-22 ENCOUNTER — Ambulatory Visit: Payer: Medicaid Other | Attending: Internal Medicine | Admitting: Internal Medicine

## 2017-03-22 ENCOUNTER — Other Ambulatory Visit (HOSPITAL_COMMUNITY)
Admission: RE | Admit: 2017-03-22 | Discharge: 2017-03-22 | Disposition: A | Discharge status: Home / Self Care | Payer: Medicaid Other | Source: Ambulatory Visit | Attending: Internal Medicine | Admitting: Internal Medicine

## 2017-03-22 ENCOUNTER — Encounter: Payer: Self-pay | Admitting: Internal Medicine

## 2017-03-22 VITALS — BP 110/78 | HR 64 | Temp 98.4°F | Resp 20 | Wt 341.0 lb

## 2017-03-22 DIAGNOSIS — B9689 Other specified bacterial agents as the cause of diseases classified elsewhere: Secondary | ICD-10-CM | POA: Diagnosis not present

## 2017-03-22 DIAGNOSIS — R7303 Prediabetes: Secondary | ICD-10-CM | POA: Diagnosis not present

## 2017-03-22 DIAGNOSIS — Z3009 Encounter for other general counseling and advice on contraception: Secondary | ICD-10-CM

## 2017-03-22 DIAGNOSIS — Z113 Encounter for screening for infections with a predominantly sexual mode of transmission: Secondary | ICD-10-CM | POA: Diagnosis not present

## 2017-03-22 DIAGNOSIS — B373 Candidiasis of vulva and vagina: Secondary | ICD-10-CM | POA: Insufficient documentation

## 2017-03-22 DIAGNOSIS — Z0000 Encounter for general adult medical examination without abnormal findings: Secondary | ICD-10-CM | POA: Insufficient documentation

## 2017-03-22 DIAGNOSIS — Z8659 Personal history of other mental and behavioral disorders: Secondary | ICD-10-CM | POA: Diagnosis not present

## 2017-03-22 DIAGNOSIS — F209 Schizophrenia, unspecified: Secondary | ICD-10-CM | POA: Diagnosis not present

## 2017-03-22 DIAGNOSIS — Z0001 Encounter for general adult medical examination with abnormal findings: Secondary | ICD-10-CM | POA: Diagnosis present

## 2017-03-22 DIAGNOSIS — Z23 Encounter for immunization: Secondary | ICD-10-CM

## 2017-03-22 LAB — GLUCOSE, POCT (MANUAL RESULT ENTRY): POC GLUCOSE: 122 mg/dL — AB (ref 70–99)

## 2017-03-22 LAB — POCT GLYCOSYLATED HEMOGLOBIN (HGB A1C): HEMOGLOBIN A1C: 6.4

## 2017-03-22 MED ORDER — METFORMIN HCL 500 MG PO TABS: 500.0000 mg | ORAL_TABLET | Freq: Every day | ORAL | 3 refills | Status: DC

## 2017-03-22 MED FILL — metFORMIN HCL 500 MG TABS: 500 | 30 days supply | Qty: 30 | Fill #0

## 2017-03-22 NOTE — Progress Notes (Signed)
Patient ID: Cindy Leonard, female    DOB: [birth year]  MRN: [id number]  CC: No chief complaint on file.   Subjective: Cindy Leonard is a 21 y.o. female who presents to establish care.  Her concerns today include:  No chronic medical issues. Hx of depression and Bipolar and schizophrenia.  Was followed by [facility], Dr. Griselda Miner.  She no longer goes because she did not like the staff.   -she was on Abilify but has been off for over 2 yrs. She d/c because that in combo with Depo caused significant wgh gain -she is looking for a new therapist.  GYN History:  Pt is G:0 P:0  Has past history of:The patient denies history of sexually transmitted disease. Chlamydia and Trich - treated 2 mths ago through HD Previous abnormal PAP smear? No PAPs in past Average interval between menses: regular every 28-30 days Length of menses:  5 days Flow: Regularity and development of irregularity: mild to moderate but last mth, flow was very heavy.  Seen in ER for this Dysmenorrhea: Yes.   Sexual activity: has sex with males.  Currently not on any method of BC..  Does not like depo and "I was not good with remembering to take the pills."  Number of partners in past year: 3  Obesity:   -"I don't have no appetite." -Exercise:  Not getting in much.    Patient Active Problem List   Diagnosis Date Noted  . Prediabetes 03/22/2017  . Morbid obesity (HCC) 03/22/2017  . Hx of bipolar disorder 03/22/2017  . Schizophrenia (HCC) 03/22/2017     Current Outpatient Prescriptions on File Prior to Visit  Medication Sig Dispense Refill  . medroxyPROGESTERone (PROVERA) 5 MG tablet Take 1 tablet (5 mg total) by mouth daily. 10 tablet 0   No current facility-administered medications on file prior to visit.     Allergies  Allergen Reactions  . Food Swelling    Eyes/throat swelling (blueberries)    Social History   Social History  . Marital status: Single    Spouse name: N/A  . Number of children:  N/A  . Years of education: N/A   Occupational History  . Not on file.   Social History Main Topics  . Smoking status: Never Smoker  . Smokeless tobacco: Never Used  . Alcohol use No  . Drug use: No     Comment: clean of cocaine x 3 yrs  . Sexual activity: Yes    Birth control/ protection: None   Other Topics Concern  . Not on file   Social History Narrative  . No narrative on file    Family History  Problem Relation Age of Onset  . Stroke Mother   . Diabetes Other   . Hypertension Other   . Asthma Father   . Asthma Brother     History reviewed. No pertinent surgical history.  ROS: Review of Systems  Constitutional: Negative for activity change, fatigue and fever.  HENT: Negative for congestion, hearing loss and sore throat.   Eyes: Negative for visual disturbance.  Respiratory: Negative for shortness of breath.   Cardiovascular: Negative for chest pain.  Gastrointestinal: Negative for abdominal pain, constipation and diarrhea.  Endocrine: Negative for cold intolerance, heat intolerance, polydipsia and polyuria.  Genitourinary:       See HPI  Neurological: Negative for dizziness.  Psychiatric/Behavioral: Negative for agitation. The patient is not nervous/anxious.     PHYSICAL EXAM: BP 110/78   Pulse 64  Temp 98.4 F (36.9 C) (Oral)   Resp 20   Wt (!) 341 lb (154.7 kg)   LMP 03/06/2017   SpO2 99%   BMI 50.36 kg/m   Wt Readings from Last 3 Encounters:  03/22/17 (!) 341 lb (154.7 kg)  02/08/17 235 lb (106.6 kg)  12/23/16 (!) 335 lb (152 kg)   Physical Exam  Constitutional: She is oriented to person, place, and time.  -year-old morbidly obese African-American female in no acute distress  HENT:  Head: Normocephalic.  Nose: Nose normal.  Mouth/Throat: Oropharynx is clear and moist. No oropharyngeal exudate.  Eyes: EOM are normal. Pupils are equal, round, and reactive to light. No scleral icterus.  Neck: No thyromegaly present.  Cardiovascular: Normal  rate, regular rhythm and normal heart sounds.   No murmur heard. Pulmonary/Chest: Effort normal and breath sounds normal.  Abdominal: Soft. There is no tenderness. There is no guarding.  Abdomen is morbidly obese  Genitourinary:  Genitourinary Comments: No external lesions. No vaginal lesions. Small to moderate amount of thin white discharge in the vaginal vault. Cervix appears normal but slips out of view easily.  Manual exam limited due to body habitus.  Musculoskeletal: Normal range of motion. She exhibits no tenderness.  Lymphadenopathy:    She has no cervical adenopathy.  Neurological: She is alert and oriented to person, place, and time. No cranial nerve deficit. Coordination normal.  Skin: Rash (fine excoriation on forearms) noted.  Hyperpigmentation of axilla and neck.  + coarse dark hair on upper posterior thorax and chin  Psychiatric: She has a normal mood and affect.    Results for orders placed or performed in visit on 03/22/17  POCT A1C  Result Value Ref Range   Hemoglobin A1C 6.4   Glucose (CBG)  Result Value Ref Range   POC Glucose 122 (A) 70 - 99 mg/dl   Depression screen [facility] 2/9 03/22/2017  Decreased Interest 0  Down, Depressed, Hopeless 0  PHQ - 2 Score 0  Altered sleeping 2  Tired, decreased energy 0  Change in appetite 3  Feeling bad or failure about yourself  3  Trouble concentrating 0  Moving slowly or fidgety/restless 0  Suicidal thoughts 0  PHQ-9 Score 8   GAD 7 : Generalized Anxiety Score 03/22/2017  Nervous, Anxious, on Edge 0  Control/stop worrying 0  Worry too much - different things 0  Trouble relaxing 0  Restless 0  Easily annoyed or irritable 0  Afraid - awful might happen 0  Total GAD 7 Score 0   Goals    . Attending meditation / other stress management classes    . I will try to loss 10 lbs by next visit.        ASSESSMENT AND PLAN: 1. Annual physical exam -safe sex practices discussed.  Encouraged use of condoms to prevent  STI -discussed other BC methods besides Depo and pills including Implanon, IUD, Nuva ring, BC patch.  Pt did not make decision.  Plans to think about it but will use condoms in main time - Tdap vaccine greater than or equal to 7yo IM - Cytology - PAP  2. Prediabetes -dietary counseling given.  Advised decreased white carbs, sweet drinks.  Advised regular aerobic exercise 150 mins/wk.  Pt agreeable to low dose Metformin, advised of possible GI S.E -pt to see clinical pharmacist for further dietary counseling - POCT A1C - Glucose (CBG) - Basic metabolic panel - metFORMIN (GLUCOPHAGE) 500 MG tablet; Take 1 tablet (500 mg  total) by mouth daily with breakfast.  Dispense: 90 tablet; Refill: 3  3. Morbid obesity (HCC) -see #2.   - . Screen for STD (sexually transmitted disease) -wet prep/G/C sent  5. Hx of bipolar disorder 6. Schizophrenia, unspecified type [facility](HCC) -given printed info about [facility] and pt encouraged to make appt  7.  Contraceptive Education   Patient was given the opportunity to ask questions.  Patient verbalized understanding of the plan and was able to repeat key elements of the plan.   Orders Placed This Encounter  Procedures  . Tdap vaccine greater than or equal to 7yo IM  . Basic metabolic panel  . POCT A1C  . Glucose (CBG)     Requested Prescriptions   Signed Prescriptions Disp Refills  . metFORMIN (GLUCOPHAGE) 500 MG tablet 90 tablet 3    Sig: Take 1 tablet (500 mg total) by mouth daily with breakfast.    Return in about 3 months (around 06/22/2017).  Jonah Blueeborah Johnson, MD, FACP

## 2017-03-22 NOTE — Patient Instructions (Signed)
Please give appointment with Dr. Lewie ChamberHammer for dietary counseling in pt with prediabetes.  Try to exercise 6 days a week for 25 minutes.   Start the Medication called Metformin as discussed.   Follow a Healthy Eating Plan - You can do it! Limit sugary drinks.  Avoid sodas, sweet tea, sport or energy drinks, or fruit drinks.  Drink water, lo-fat milk, or diet drinks. Limit snack foods.   Cut back on candy, cake, cookies, chips, ice cream.  These are a special treat, only in small amounts. Eat plenty of vegetables.  Especially dark green, red, and orange vegetables. Aim for at least 3 servings a Tall. More is better! Include fruit in your daily diet.  Whole fruit is much healthier than fruit juice! Limit "white" bread, "white" pasta, "white" rice.   Choose "100% whole grain" products, brown or wild rice. Avoid fatty meats. Try "Meatless Monday" and choose eggs or beans one [place] a week.  When eating meat, choose lean meats like chicken, [place]urkey, and fish.  Grill, broil, or bake meats instead of frying, and eat poultry without the skin. Eat less salt.  Avoid frozen pizzas, frozen dinners and salty foods.  Use seasonings other than salt in cooking.  This can help blood pressure and keep you from swelling Beer, wine and liquor have calories.  If you can safely drink alcohol, limit to 1 drink per [place] for women, 2 drinks for men

## 2017-03-23 ENCOUNTER — Telehealth: Payer: Self-pay

## 2017-03-23 ENCOUNTER — Other Ambulatory Visit: Payer: Self-pay | Admitting: Internal Medicine

## 2017-03-23 DIAGNOSIS — B3731 Acute candidiasis of vulva and vagina: Secondary | ICD-10-CM

## 2017-03-23 DIAGNOSIS — N760 Acute vaginitis: Principal | ICD-10-CM

## 2017-03-23 DIAGNOSIS — B9689 Other specified bacterial agents as the cause of diseases classified elsewhere: Secondary | ICD-10-CM

## 2017-03-23 DIAGNOSIS — B373 Candidiasis of vulva and vagina: Secondary | ICD-10-CM

## 2017-03-23 LAB — BASIC METABOLIC PANEL
BUN/Creatinine Ratio: 10 (ref 9–23)
BUN: 10 mg/dL (ref 6–20)
CO2: 22 mmol/L (ref 18–29)
CREATININE: 0.99 mg/dL (ref 0.57–1.00)
Calcium: 9.7 mg/dL (ref 8.7–10.2)
Chloride: 104 mmol/L (ref 96–106)
GFR, EST AFRICAN AMERICAN: 94 mL/min/{1.73_m2} (ref >59)
GFR, EST NON AFRICAN AMERICAN: 81 mL/min/{1.73_m2} (ref >59)
Glucose: 105 mg/dL — ABNORMAL HIGH (ref 65–99)
POTASSIUM: 4.4 mmol/L (ref 3.5–5.2)
SODIUM: 139 mmol/L (ref 134–144)

## 2017-03-23 LAB — CYTOLOGY - PAP: Diagnosis: NEGATIVE

## 2017-03-23 LAB — CERVICOVAGINAL ANCILLARY ONLY
CHLAMYDIA, DNA PROBE: NEGATIVE
Neisseria Gonorrhea: NEGATIVE
WET PREP (BD AFFIRM): POSITIVE — AB

## 2017-03-23 MED ORDER — METRONIDAZOLE 500 MG PO TABS: 500.0000 mg | ORAL_TABLET | Freq: Two times a day (BID) | ORAL | 0 refills | Status: DC

## 2017-03-23 MED ORDER — FLUCONAZOLE 150 MG PO TABS: 150.0000 mg | ORAL_TABLET | Freq: Once | ORAL | 0 refills | Status: AC

## 2017-03-23 MED FILL — FLUCONAZOLE 150 MG TABLET: 150 | 1 days supply | Qty: 1 | Fill #0

## 2017-03-23 MED FILL — metroNIDAZOLE 500 MG TABS: 500 | 7 days supply | Qty: 14 | Fill #0

## 2017-03-23 NOTE — Progress Notes (Signed)
Rxn sent to [facility] pharmacy for Flagyl and Diflucan to treat yeast and BV.

## 2017-03-23 NOTE — Telephone Encounter (Signed)
Clld pt - advsd of lab results; provider's notations; Rxs for Yeast infection and Bacteria Vaginosis. Pt stated she understood and had o other questions at this time.

## 2017-03-23 NOTE — Telephone Encounter (Signed)
-----   Message from Marcine Matareborah B Johnson, MD sent at 03/23/2017  3:15 PM EDT ----- Please let patient know that her pap smear was normal. Needs repeat in 3 years.  Vaginal smear positive for yeast and Bacteria Vaginosis (due to over growth of normal bacteria in the vagina).  Both are NOT sexually transmitted infections.  I have sent prescriptions to our pharmacy for Diflucan to treat the yeast and for Flagyl to treat the bacterial vaginosis.  Do not drink any alcoholic beverages while taking the Flagyl.

## 2017-03-25 ENCOUNTER — Encounter (HOSPITAL_COMMUNITY): Payer: Self-pay

## 2017-03-25 ENCOUNTER — Emergency Department (HOSPITAL_COMMUNITY)
Admission: EM | Admit: 2017-03-25 | Discharge: 2017-03-25 | Disposition: A | Discharge status: Home / Self Care | Payer: Medicaid Other | Attending: Emergency Medicine | Admitting: Emergency Medicine

## 2017-03-25 DIAGNOSIS — L509 Urticaria, unspecified: Secondary | ICD-10-CM | POA: Diagnosis not present

## 2017-03-25 DIAGNOSIS — R21 Rash and other nonspecific skin eruption: Secondary | ICD-10-CM | POA: Diagnosis present

## 2017-03-25 DIAGNOSIS — Z7984 Long term (current) use of oral hypoglycemic drugs: Secondary | ICD-10-CM | POA: Insufficient documentation

## 2017-03-25 MED ORDER — FAMOTIDINE 20 MG PO TABS
20.0000 mg | ORAL_TABLET | Freq: Once | ORAL | Status: AC
Start: 2017-03-25 — End: 2017-03-25
  Administered 2017-03-25: 20 mg via ORAL
  Filled 2017-03-25: qty 1

## 2017-03-25 MED ORDER — DEXAMETHASONE 4 MG PO TABS
10.0000 mg | ORAL_TABLET | Freq: Once | ORAL | Status: AC
Start: 2017-03-25 — End: 2017-03-25
  Administered 2017-03-25: 10 mg via ORAL
  Filled 2017-03-25: qty 3

## 2017-03-25 MED ORDER — DIPHENHYDRAMINE HCL 25 MG PO CAPS
50.0000 mg | ORAL_CAPSULE | Freq: Once | ORAL | Status: AC
  Administered 2017-03-25: 50 mg via ORAL
  Filled 2017-03-25: qty 2

## 2017-03-25 NOTE — Discharge Instructions (Signed)
Follow up with your PCP, go back to your prior laundry detergent.

## 2017-03-25 NOTE — ED Provider Notes (Signed)
[facility]-EMERGENCY DEPT Provider Note   CSN: [id number] Arrival date & time: 03/25/17  1617  By signing my name below, I, Thelma Barge, attest that this documentation has been prepared under the direction and in the presence of No att. providers found. Electronically Signed: Thelma Barge, Scribe. 03/25/17. 4:43 PM.  History   Chief Complaint Chief Complaint  Patient presents with  . Rash   The history is provided by the patient. No language interpreter was used.  Rash   This is a new problem. The current episode started more than 2 days ago. The problem has not changed since onset.The problem is associated with a new detergent/soap. There has been no fever. The rash is present on the left arm, right arm and torso. The patient is experiencing no pain.   HPI Comments: Cindy Leonard is a 21 y.o. female who presents to the Emergency Department complaining of a constant, itchy rash on both arms and chest since 1 week. She notes she recently switched laundry detergents. She denies fever, sore throat, wheezing, and vomiting  Past Medical History:  Diagnosis Date  . Bipolar 1 disorder (HCC)   . Depression   . Schizophrenia [facility])     Patient Active Problem List   Diagnosis Date Noted  . Prediabetes 03/22/2017  . Morbid obesity (HCC) 03/22/2017  . Hx of bipolar disorder 03/22/2017  . Schizophrenia (HCC) 03/22/2017    History reviewed. No pertinent surgical history.  OB History    No data available       Home Medications    Prior to Admission medications   Medication Sig Start Date End Date Taking? Authorizing Provider  medroxyPROGESTERone (PROVERA) 5 MG tablet Take 1 tablet (5 mg total) by mouth daily. 02/08/17 02/18/17  Shaune Pollack, MD  metFORMIN (GLUCOPHAGE) 500 MG tablet Take 1 tablet (500 mg total) by mouth daily with breakfast. 03/22/17   Marcine Matar, MD  metroNIDAZOLE (FLAGYL) 500 MG tablet Take 1 tablet (500 mg total) by mouth 2 (two) times daily. 03/23/17    Marcine Matar, MD    Family History Family History  Problem Relation Age of Onset  . Stroke Mother   . Diabetes Other   . Hypertension Other   . Asthma Father   . Asthma Brother     Social History Social History  Substance Use Topics  . Smoking status: Never Smoker  . Smokeless tobacco: Never Used  . Alcohol use No     Allergies   Food   Review of Systems Review of Systems  Constitutional: Negative for chills and fever.  HENT: Negative for congestion and rhinorrhea.   Eyes: Negative for redness and visual disturbance.  Respiratory: Negative for shortness of breath and wheezing.   Cardiovascular: Negative for chest pain and palpitations.  Gastrointestinal: Negative for nausea and vomiting.  Genitourinary: Negative for dysuria and urgency.  Musculoskeletal: Negative for arthralgias and myalgias.  Skin: Positive for rash. Negative for pallor and wound.  Neurological: Negative for dizziness and headaches.     Physical Exam Updated Vital Signs BP 136/88 (BP Location: Left Arm)   Pulse 76   Temp 97.6 F (36.4 C) (Oral)   Resp 16   Ht 5\' 7"  (1.702 m)   Wt (!) 154.7 kg (341 lb)   LMP 03/06/2017   SpO2 98%   BMI 53.41 kg/m   Physical Exam  Constitutional: She is oriented to person, place, and time. She appears well-developed and well-nourished. No distress.  HENT:  Head: Normocephalic  and atraumatic.  Her throat is normal, normal tonsils,   Eyes: EOM are normal. Pupils are equal, round, and reactive to light.  Neck: Normal range of motion. Neck supple.  Cardiovascular: Normal rate and regular rhythm.  Exam reveals no gallop and no friction rub.   No murmur heard. Pulmonary/Chest: Effort normal. She has no wheezes. She has no rales.  no wheezing,  Abdominal: Soft. She exhibits no distension. There is no tenderness.   no abd pain    Musculoskeletal: She exhibits no edema or tenderness.  Neurological: She is alert and oriented to person, place, and  time.  Skin: Skin is warm and dry. She is not diaphoretic.  Sandpaper-like rash to bilateral upper extremity and upper chest Sparing abdomen, back, legs   Psychiatric: She has a normal mood and affect. Her behavior is normal.  Nursing note and vitals reviewed.    ED Treatments / Results  DIAGNOSTIC STUDIES: Oxygen Saturation is 98% on RA, normal by my interpretation.    COORDINATION OF CARE: 4:42 PM Discussed treatment plan with pt at bedside and pt agreed to plan.  Labs (all labs ordered are listed, but only abnormal results are displayed) Labs Reviewed - No data to display  EKG  EKG Interpretation None       Radiology No results found.  Procedures Procedures (including critical care time)  Medications Ordered in ED Medications  dexamethasone (DECADRON) tablet 10 mg (10 mg Oral Given 03/25/17 1648)  diphenhydrAMINE (BENADRYL) capsule 50 mg (50 mg Oral Given 03/25/17 1647)  famotidine (PEPCID) tablet 20 mg (20 mg Oral Given 03/25/17 1647)     Initial Impression / Assessment and Plan / ED Course  I have reviewed the triage vital signs and the nursing notes.  Pertinent labs & imaging results that were available during my care of the patient were reviewed by me and considered in my medical decision making (see chart for details).     21 yo F with sandpaper like rash to bilateral upper extremities and her upper chest. No other areas of rash. Denies fever denies sore throat. Normal-appearing throat. This is pruritic suspect this is a contact allergy. Given a dose of Decadron. Will take Benadryl at home for itching. PCP follow-up.  6:47 PM:  I have discussed the diagnosis/risks/treatment options with the patient and believe the pt to be eligible for discharge home to follow-up with PCP. We also discussed returning to the ED immediately if new or worsening sx occur. We discussed the sx which are most concerning (e.g., sudden worsening pain, fever, inability to tolerate by  mouth) that necessitate immediate return. Medications administered to the patient during their visit and any new prescriptions provided to the patient are listed below.  Medications given during this visit Medications  dexamethasone (DECADRON) tablet 10 mg (10 mg Oral Given 03/25/17 1648)  diphenhydrAMINE (BENADRYL) capsule 50 mg (50 mg Oral Given 03/25/17 1647)  famotidine (PEPCID) tablet 20 mg (20 mg Oral Given 03/25/17 1647)     The patient appears reasonably screen and/or stabilized for discharge and I doubt any other medical condition or other [facility] requiring further screening, evaluation, or treatment in the ED at this time prior to discharge.    Final Clinical Impressions(s) / ED Diagnoses   Final diagnoses:  Urticaria    New Prescriptions Discharge Medication List as of 03/25/2017  4:44 PM     I personally performed the services described in this documentation, which was scribed in my presence. The recorded information has  been reviewed and is accurate.     Melene PlanFloyd, Sydney Hasten, DO 03/25/17 [phone]

## 2017-03-25 NOTE — ED Triage Notes (Signed)
Pt has fine rash to bilateral upper arms x 1 week after getting new laundry detergent

## 2017-04-06 ENCOUNTER — Ambulatory Visit: Payer: Medicaid Other | Admitting: Pharmacist

## 2017-04-06 NOTE — Progress Notes (Deleted)
    S:     No chief complaint on file.   Patient arrives in good spirits.  Presents for prediabetes education at the request of Dr. Laural BenesJohnson. Patient was referred on 03/22/17.  Patient was last seen by Primary Care Provider on 03/22/17.   Family/Social History:   Patient {Actions; denies-reports:120008} adherence with medications.  Current diabetes medications include: metformin 500 mg daily.   Patient {Actions; denies-reports:120008} hypoglycemic events.  Patient reported dietary habits: Eats *** meals/[place] Breakfast:*** Lunch:*** Dinner:*** Snacks:*** Drinks:***  Patient reported exercise habits:    Patient {Actions; denies-reports:120008} nocturia.  Patient {Actions; denies-reports:120008} neuropathy. Patient {Actions; denies-reports:120008} visual changes. Patient {Actions; denies-reports:120008} self foot exams.    O:  Physical Exam   ROS   Lab Results  Component Value Date   HGBA1C 6.4 03/22/2017   There were no vitals filed for this visit.  Home fasting CBG: ***  2 hour post-prandial/random CBG: ***.  10 year ASCVD risk: ***.  A/P: Pre-diabetes newly diagnosed currently ***. Patient {Actions; denies-reports:120008} hypoglycemic events and is able to verbalize appropriate hypoglycemia management plan. Patient {Actions; denies-reports:120008} adherence with medication. Control is suboptimal due to dietary indiscretion and sedentary lifestyle.  Next A1C anticipated August 2018.    Written patient instructions provided.  Total time in face to face counseling *** minutes.   Follow up in Pharmacist Clinic Visit PRN.   Patient seen with Dutch QuintKaley Whitesell, PharmD Candidate and Louis MatteJay Worthington, PharmD Candidate

## 2017-04-07 ENCOUNTER — Encounter (HOSPITAL_COMMUNITY): Payer: Self-pay

## 2017-04-07 ENCOUNTER — Emergency Department (HOSPITAL_COMMUNITY)
Admission: EM | Admit: 2017-04-07 | Discharge: 2017-04-07 | Disposition: A | Discharge status: Home / Self Care | Payer: Medicaid Other | Attending: Emergency Medicine | Admitting: Emergency Medicine

## 2017-04-07 DIAGNOSIS — Z7984 Long term (current) use of oral hypoglycemic drugs: Secondary | ICD-10-CM | POA: Diagnosis not present

## 2017-04-07 DIAGNOSIS — R21 Rash and other nonspecific skin eruption: Secondary | ICD-10-CM | POA: Diagnosis present

## 2017-04-07 DIAGNOSIS — Z79899 Other long term (current) drug therapy: Secondary | ICD-10-CM | POA: Insufficient documentation

## 2017-04-07 MED ORDER — HYDROCORTISONE 2.5 % EX CREA: TOPICAL_CREAM | Freq: Two times a day (BID) | CUTANEOUS | 1 refills | Status: DC

## 2017-04-07 MED ORDER — HYDROXYZINE HCL 10 MG PO TABS: 10.0000 mg | ORAL_TABLET | Freq: Four times a day (QID) | ORAL | 0 refills | Status: DC | PRN

## 2017-04-07 NOTE — ED Provider Notes (Signed)
[facility]-EMERGENCY DEPT Provider Note   CSN: [id number] Arrival date & time: 04/07/17  1758     History   Chief Complaint Chief Complaint  Patient presents with  . Rash    HPI Cindy Leonard is a 21 y.o. female.  Cindy Leonard is a 21 y.o. Female who presents to the emergency department complaining of a rash to her bilateral legs for the past week. She reports the rash is itchy. She was seen in the emergency department week and a half ago with a rash to her upper extremities. She is given Benadryl and Decadron reports it's improved quite a bit. She still reports some slight itchiness to her bilateral arms. She also reports itching to her bilateral upper thighs. No other itching noted. No one at home with similar rash. She's had no fevers. No treatments attempted prior to arrival. She denies fevers, tongue swelling, lip swelling, changes to her soaps, lotions, perfumes or detergents, shortness of breath or other complaints.   The history is provided by the patient and medical records. No language interpreter was used.  Rash      Past Medical History:  Diagnosis Date  . Bipolar 1 disorder (HCC)   . Depression   . Schizophrenia [facility])     Patient Active Problem List   Diagnosis Date Noted  . Prediabetes 03/22/2017  . Morbid obesity (HCC) 03/22/2017  . Hx of bipolar disorder 03/22/2017  . Schizophrenia (HCC) 03/22/2017    History reviewed. No pertinent surgical history.  OB History    Gravida Para Term Preterm AB Living   1             SAB TAB Ectopic Multiple Live Births                   Home Medications    Prior to Admission medications   Medication Sig Start Date End Date Taking? Authorizing Provider  hydrocortisone 2.5 % cream Apply topically 2 (two) times daily. 04/07/17   Everlene Farrier, PA-C  hydrOXYzine (ATARAX/VISTARIL) 10 MG tablet Take 1 tablet (10 mg total) by mouth every 6 (six) hours as needed for itching. 04/07/17   Everlene Farrier, PA-C    medroxyPROGESTERone (PROVERA) 5 MG tablet Take 1 tablet (5 mg total) by mouth daily. 02/08/17 02/18/17  Shaune Pollack, MD  metFORMIN (GLUCOPHAGE) 500 MG tablet Take 1 tablet (500 mg total) by mouth daily with breakfast. 03/22/17   Marcine Matar, MD  metroNIDAZOLE (FLAGYL) 500 MG tablet Take 1 tablet (500 mg total) by mouth 2 (two) times daily. 03/23/17   Marcine Matar, MD    Family History Family History  Problem Relation Age of Onset  . Stroke Mother   . Diabetes Other   . Hypertension Other   . Asthma Father   . Asthma Brother     Social History Social History  Substance Use Topics  . Smoking status: Never Smoker  . Smokeless tobacco: Never Used  . Alcohol use No     Allergies   Food   Review of Systems Review of Systems  Constitutional: Negative for chills and fever.  HENT: Negative for congestion, facial swelling and trouble swallowing.   Respiratory: Negative for shortness of breath.   Gastrointestinal: Negative for abdominal pain.  Musculoskeletal: Negative for myalgias.  Skin: Positive for rash.     Physical Exam Updated Vital Signs BP 133/80 (BP Location: Left Arm)   Pulse 63   Temp 98.3 F (36.8 C) (Oral)  Resp 18   Wt (!) 152 kg (335 lb)   LMP 02/05/2017   SpO2 97%   BMI 52.47 kg/m   Physical Exam  Constitutional: She appears well-developed and well-nourished. No distress.  Nontoxic appearing.  HENT:  Head: Normocephalic and atraumatic.  Right Ear: External ear normal.  Left Ear: External ear normal.  Mouth/Throat: Oropharynx is clear and moist.  Throat is clear.  Eyes: Pupils are equal, round, and reactive to light. Right eye exhibits no discharge. Left eye exhibits no discharge.  Pulmonary/Chest: Effort normal. No respiratory distress.  Neurological: She is alert. Coordination normal.  Skin: Skin is warm and dry. Capillary refill takes less than 2 seconds. She is not diaphoretic. No erythema. No pallor.  Slight excoriations noted  to her bilateral upper thighs. No evidence of erythema, cellulitis or discharge. No vesicles or bulla. No evidence of obvious rash. No sandpaper like rash noted.   Psychiatric: She has a normal mood and affect. Her behavior is normal.  Nursing note and vitals reviewed.    ED Treatments / Results  Labs (all labs ordered are listed, but only abnormal results are displayed) Labs Reviewed - No data to display  EKG  EKG Interpretation None       Radiology No results found.  Procedures Procedures (including critical care time)  Medications Ordered in ED Medications - No data to display   Initial Impression / Assessment and Plan / ED Course  I have reviewed the triage vital signs and the nursing notes.  Pertinent labs & imaging results that were available during my care of the patient were reviewed by me and considered in my medical decision making (see chart for details).    This is a 21 y.o. Female who presents to the emergency department complaining of a rash to her bilateral legs for the past week. She reports the rash is itchy. She was seen in the emergency department week and a half ago with a rash to her upper extremities. She is given Benadryl and Decadron reports it's improved quite a bit. She still reports some slight itchiness to her bilateral arms. She also reports itching to her bilateral upper thighs. No other itching noted. No one at home with similar rash. She's had no fevers.  On exam patient is afebrile nontoxic appearing. She is evidence of excoriations on her thighs but no evidence of cellulitis or skin infection. No evidence of obvious rash. No sandpaper like rash as noted previously. We'll discharge with cortisone and Atarax. Return precautions discussed. I encouraged close follow-up by primary care I advised the patient to follow-up with their primary care provider this week. I advised the patient to return to the emergency department with new or worsening symptoms  or new concerns. The patient verbalized understanding and agreement with plan.      Final Clinical Impressions(s) / ED Diagnoses   Final diagnoses:  Rash and nonspecific skin eruption    New Prescriptions New Prescriptions   HYDROCORTISONE 2.5 % CREAM    Apply topically 2 (two) times daily.   HYDROXYZINE (ATARAX/VISTARIL) 10 MG TABLET    Take 1 tablet (10 mg total) by mouth every 6 (six) hours as needed for itching.     Everlene FarrierDansie, Makyna Niehoff, PA-C 04/07/17 2149    Jacalyn LefevreHaviland, Julie, MD 04/07/17 [phone]

## 2017-04-07 NOTE — ED Triage Notes (Signed)
Pt complains of an unknown rash on her legs for one week

## 2017-04-13 ENCOUNTER — Ambulatory Visit: Payer: Medicaid Other | Admitting: Pharmacist

## 2017-04-13 NOTE — Progress Notes (Deleted)
    S:     No chief complaint on file.   Patient arrives in good spirits.  Presents for prediabetes education at the request of Dr. Johnson. Patient was referred on 03/22/17.  Patient was last seen by Primary Care Provider on 03/22/17.   Family/Social History:   Patient {Actions; denies-reports:120008} adherence with medications.  Current diabetes medications include: metformin 500 mg daily.   Patient {Actions; denies-reports:120008} hypoglycemic events.  Patient reported dietary habits: Eats *** meals/[place] Breakfast:*** Lunch:*** Dinner:*** Snacks:*** Drinks:***  Patient reported exercise habits:    Patient {Actions; denies-reports:120008} nocturia.  Patient {Actions; denies-reports:120008} neuropathy. Patient {Actions; denies-reports:120008} visual changes. Patient {Actions; denies-reports:120008} self foot exams.    O:  Physical Exam   ROS   Lab Results  Component Value Date   HGBA1C 6.4 03/22/2017   There were no vitals filed for this visit.  Home fasting CBG: ***  2 hour post-prandial/random CBG: ***.  10 year ASCVD risk: ***.  A/P: Pre-diabetes newly diagnosed currently ***. Patient {Actions; denies-reports:120008} hypoglycemic events and is able to verbalize appropriate hypoglycemia management plan. Patient {Actions; denies-reports:120008} adherence with medication. Control is suboptimal due to dietary indiscretion and sedentary lifestyle.  Next A1C anticipated August 2018.    Written patient instructions provided.  Total time in face to face counseling *** minutes.   Follow up in Pharmacist Clinic Visit PRN.   Patient seen with Kaley Whitesell, PharmD Candidate and Jay Worthington, PharmD Candidate    

## 2017-04-23 ENCOUNTER — Ambulatory Visit: Payer: Medicaid Other | Admitting: Internal Medicine

## 2017-05-17 ENCOUNTER — Emergency Department (HOSPITAL_COMMUNITY)
Admission: EM | Admit: 2017-05-17 | Discharge: 2017-05-17 | Disposition: A | Discharge status: Home / Self Care | Payer: Medicaid Other | Attending: Emergency Medicine | Admitting: Emergency Medicine

## 2017-05-17 ENCOUNTER — Encounter (HOSPITAL_COMMUNITY): Payer: Self-pay | Admitting: Emergency Medicine

## 2017-05-17 DIAGNOSIS — Z79899 Other long term (current) drug therapy: Secondary | ICD-10-CM | POA: Diagnosis not present

## 2017-05-17 DIAGNOSIS — R229 Localized swelling, mass and lump, unspecified: Secondary | ICD-10-CM | POA: Diagnosis present

## 2017-05-17 MED ORDER — SULFAMETHOXAZOLE-TRIMETHOPRIM 800-160 MG PO TABS: 1.0000 | ORAL_TABLET | Freq: Two times a day (BID) | ORAL | 0 refills | Status: AC

## 2017-05-17 NOTE — ED Notes (Signed)
PT states understanding of care given, follow up care, and medication prescribed. PT ambulated from ED to car with a steady gait. 

## 2017-05-17 NOTE — ED Provider Notes (Signed)
[facility]-EMERGENCY DEPT Provider Note   CSN: [id number] Arrival date & time: 05/17/17  1707  By signing my name below, I, Deland PrettySherilynn Knight, attest that this documentation has been prepared under the direction and in the presence of Leslie K. Sofia, PA-C. Electronically Signed: Deland PrettySherilynn Knight, ED Scribe. 05/17/17. 7:13 PM.  History   Chief Complaint Chief Complaint  Patient presents with  . lump on side   The history is provided by the patient. No language interpreter was used.   HPI Comments: Cindy Leonard is an otherwise healthy 21 y.o. female who presents to the Emergency Department complaining of gradually worsening, left sided chest pain due to a mass that she noticed a week ago. Palpation to the area exacerbates her symptoms. She has a PMHx of schizophrenia, Bipolar 1 Disorder, and depression. She also suffers from prediabetes and morbid obesity, but denies taking daily medications. The pt denies chills and fever.   Past Medical History:  Diagnosis Date  . Bipolar 1 disorder (HCC)   . Depression   . Schizophrenia [facility](HCC)     Patient Active Problem List   Diagnosis Date Noted  . Prediabetes 03/22/2017  . Morbid obesity (HCC) 03/22/2017  . Hx of bipolar disorder 03/22/2017  . Schizophrenia (HCC) 03/22/2017    History reviewed. No pertinent surgical history.  OB History    Gravida Para Term Preterm AB Living   1             SAB TAB Ectopic Multiple Live Births                   Home Medications    Prior to Admission medications   Medication Sig Start Date End Date Taking? Authorizing Provider  hydrocortisone 2.5 % cream Apply topically 2 (two) times daily. 04/07/17   Everlene Farrieransie, William, PA-C  hydrOXYzine (ATARAX/VISTARIL) 10 MG tablet Take 1 tablet (10 mg total) by mouth every 6 (six) hours as needed for itching. 04/07/17   Everlene Farrieransie, William, PA-C  medroxyPROGESTERone (PROVERA) 5 MG tablet Take 1 tablet (5 mg total) by mouth daily. 02/08/17 02/18/17  Shaune PollackIsaacs, Cameron, MD    metFORMIN (GLUCOPHAGE) 500 MG tablet Take 1 tablet (500 mg total) by mouth daily with breakfast. 03/22/17   Marcine MatarJohnson, Deborah B, MD  metroNIDAZOLE (FLAGYL) 500 MG tablet Take 1 tablet (500 mg total) by mouth 2 (two) times daily. 03/23/17   Marcine MatarJohnson, Deborah B, MD    Family History Family History  Problem Relation Age of Onset  . Stroke Mother   . Diabetes Other   . Hypertension Other   . Asthma Father   . Asthma Brother     Social History Social History  Substance Use Topics  . Smoking status: Never Smoker  . Smokeless tobacco: Never Used  . Alcohol use No     Allergies   Food   Review of Systems Review of Systems  Cardiovascular: Positive for chest pain.  Skin: Positive for wound.  All other systems reviewed and are negative.    Physical Exam Updated Vital Signs BP (!) 142/93 (BP Location: Right Arm)   Pulse 68   Temp 98.1 F (36.7 C) (Oral)   Resp 16   Ht 5\' 6"  (1.676 m)   Wt (!) 333 lb (151 kg)   LMP 05/06/2017 (Exact Date)   SpO2 98%   Breastfeeding? Unknown   BMI 53.75 kg/m   Physical Exam  Constitutional: She is oriented to person, place, and time. She appears well-developed and well-nourished.  HENT:  Head: Normocephalic.  Eyes: EOM are normal.  Neck: Normal range of motion.  Pulmonary/Chest: Effort normal.  Abdominal: She exhibits no distension.  Musculoskeletal: Normal range of motion.  Neurological: She is alert and oriented to person, place, and time.  Skin:  1.5 cm round tender area on left chest.  Psychiatric: She has a normal mood and affect.  Nursing note and vitals reviewed.    ED Treatments / Results   DIAGNOSTIC STUDIES: Oxygen Saturation is 98% on RA, normal by my interpretation.   COORDINATION OF CARE: 7:15 PM-Discussed next steps with pt. Pt verbalized understanding and is agreeable with the plan.   Labs (all labs ordered are listed, but only abnormal results are displayed) Labs Reviewed - No data to display  EKG   EKG Interpretation None       Radiology No results found.  Procedures Procedures (including critical care time)  Medications Ordered in ED Medications - No data to display   Initial Impression / Assessment and Plan / ED Course  I have reviewed the triage vital signs and the nursing notes.  Pertinent labs & imaging results that were available during my care of the patient were reviewed by me and considered in my medical decision making (see chart for details).    I suspect abscess, pimple,  Doubt breast mass.  I discussed importance of follow up Pt advised to soak area.  Recheck in 3 days with primary or urgent acre.    Final Clinical Impressions(s) / ED Diagnoses   Final diagnoses:  Mass of skin    New Prescriptions Discharge Medication List as of 05/17/2017  7:29 PM    START taking these medications   Details  sulfamethoxazole-trimethoprim (BACTRIM DS,SEPTRA DS) 800-160 MG tablet Take 1 tablet by mouth 2 (two) times daily., Starting Mon 05/17/2017, Until Mon 05/24/2017, Print      An After Visit Summary was printed and given to the patient.      Osie Cheeks 05/17/17 2017    Doug Sou, MD 05/17/17 2130

## 2017-05-17 NOTE — ED Notes (Signed)
Patient is A&Ox4.  No signs of distress noted.  Please see providers complete history and physical exam.  

## 2017-05-17 NOTE — Discharge Instructions (Signed)
Soak area 20 minutes 4 times a [place].  Warm compresses.  See your Provider for [address] [place] recheck or recheck at Urgent care.

## 2017-05-17 NOTE — ED Triage Notes (Signed)
C/o painful lump on left side , hard, warm to touch, states had a boil "when I was I kid" no drainage.

## 2017-07-09 ENCOUNTER — Ambulatory Visit: Payer: Medicaid Other | Admitting: Internal Medicine

## 2017-09-02 ENCOUNTER — Encounter: Payer: Self-pay | Admitting: Internal Medicine

## 2017-09-02 ENCOUNTER — Ambulatory Visit: Payer: Medicaid Other | Attending: Internal Medicine | Admitting: Internal Medicine

## 2017-09-02 DIAGNOSIS — F209 Schizophrenia, unspecified: Secondary | ICD-10-CM | POA: Diagnosis not present

## 2017-09-02 DIAGNOSIS — Z6841 Body Mass Index (BMI) 40.0 and over, adult: Secondary | ICD-10-CM | POA: Insufficient documentation

## 2017-09-02 DIAGNOSIS — F319 Bipolar disorder, unspecified: Secondary | ICD-10-CM | POA: Diagnosis not present

## 2017-09-02 DIAGNOSIS — Z23 Encounter for immunization: Secondary | ICD-10-CM | POA: Diagnosis not present

## 2017-09-02 DIAGNOSIS — Z79899 Other long term (current) drug therapy: Secondary | ICD-10-CM | POA: Insufficient documentation

## 2017-09-02 DIAGNOSIS — E119 Type 2 diabetes mellitus without complications: Secondary | ICD-10-CM | POA: Insufficient documentation

## 2017-09-02 DIAGNOSIS — R030 Elevated blood-pressure reading, without diagnosis of hypertension: Secondary | ICD-10-CM | POA: Insufficient documentation

## 2017-09-02 LAB — POCT GLYCOSYLATED HEMOGLOBIN (HGB A1C): Hemoglobin A1C: 6.8

## 2017-09-02 LAB — GLUCOSE, POCT (MANUAL RESULT ENTRY): POC Glucose: 168 mg/dl — AB (ref 70–99)

## 2017-09-02 MED ORDER — TRUE METRIX METER W/DEVICE KIT: PACK | 0 refills | Status: DC

## 2017-09-02 MED ORDER — GLUCOSE BLOOD VI STRP: ORAL_STRIP | 12 refills | Status: DC

## 2017-09-02 MED ORDER — TRUEPLUS LANCETS 28G MISC: 1 refills | Status: DC

## 2017-09-02 MED ORDER — METFORMIN HCL 500 MG PO TABS: 500.0000 mg | ORAL_TABLET | Freq: Every day | ORAL | 3 refills | Status: DC

## 2017-09-02 MED FILL — metFORMIN HCL 500 MG TABS: 500 | 30 days supply | Qty: 30 | Fill #0

## 2017-09-02 NOTE — Progress Notes (Signed)
Patient is here for a follow up.

## 2017-09-02 NOTE — Progress Notes (Signed)
Patient ID: Cindy Leonard, female    DOB: Oct 10, 1996  MRN: [id number]  CC: Follow-up   Subjective: Cindy Leonard is a 21 y.o. female who presents for chronic disease management. Last seen May of this year. Her concerns today include:  Patient with history of prediabetes, schizophrenia, bipolar disorder, obesity.  1. Still followed by [facility] for behavioral health.  -She is on Dietitian and Equetro  2. PreDM: Patient started on metformin on last visit. However she has not been taking it consistently. -drinking more water. Avoids sugary drinks. -she can do better with eating habits. "I used to sit around and eat all [place] but now that I'm working and not eating as much." She has been working for the past month at a gas station -Not getting in much exercise  Patient Active Problem List   Diagnosis Date Noted  . Prediabetes 03/22/2017  . Morbid obesity ([place]) 03/22/2017  . Hx of bipolar disorder 03/22/2017  . Schizophrenia ([place]) 03/22/2017     Current Outpatient Prescriptions on File Prior to Visit  Medication Sig Dispense Refill  . hydrocortisone 2.5 % cream Apply topically 2 (two) times daily. (Patient not taking: Reported on 09/02/2017) 30 g 1  . hydrOXYzine (ATARAX/VISTARIL) 10 MG tablet Take 1 tablet (10 mg total) by mouth every 6 (six) hours as needed for itching. (Patient not taking: Reported on 09/02/2017) 30 tablet 0  . medroxyPROGESTERone (PROVERA) 5 MG tablet Take 1 tablet (5 mg total) by mouth daily. 10 tablet 0  . metroNIDAZOLE (FLAGYL) 500 MG tablet Take 1 tablet (500 mg total) by mouth 2 (two) times daily. (Patient not taking: Reported on 09/02/2017) 14 tablet 0   No current facility-administered medications on file prior to visit.     Allergies  Allergen Reactions  . Food Swelling    Eyes/throat swelling (blueberries)    Social History   Social History  . Marital status: Single    Spouse name: N/A  . Number of children: N/A  . Years of education: N/A    Occupational History  . Not on file.   Social History Main Topics  . Smoking status: Never Smoker  . Smokeless tobacco: Never Used  . Alcohol use No  . Drug use: No     Comment: clean of cocaine x 3 yrs  . Sexual activity: Yes    Birth control/ protection: None   Other Topics Concern  . Not on file   Social History Narrative  . No narrative on file    Family History  Problem Relation Age of Onset  . Stroke Mother   . Diabetes Other   . Hypertension Other   . Asthma Father   . Asthma Brother     History reviewed. No pertinent surgical history.  ROS: Review of Systems Negative except as stated above PHYSICAL EXAM: BP (!) 146/94 (BP Location: Right Wrist, Patient Position: Sitting, Cuff Size: Normal)   Pulse (!) 108   Temp 98.2 F (36.8 C) (Oral)   Resp 18   Ht _0  (1.753 m)   Wt (!) 349 lb (158.3 kg)   SpO2 98%   BMI 51.54 kg/m   Wt Readings from Last 3 Encounters:  09/02/17 (!) 349 lb (158.3 kg)  05/17/17 (!) 333 lb (151 kg)  04/07/17 (!) 335 lb (152 kg)  Repeat BP 136/84  Physical Exam  General appearance - alert, well appearing, and in no distress Mental status - alert, oriented to person, place, and time  Chest - clear to auscultation, no wheezes, rales or rhonchi, symmetric air entry Heart - normal rate, regular rhythm, normal S1, S2, no murmurs, rubs, clicks or gallops Extremities - peripheral pulses normal, no pedal edema, no clubbing or cyanosis  Results for orders placed or performed in visit on 09/02/17  POCT glucose (manual entry)  Result Value Ref Range   POC Glucose 168 (A) 70 - 99 mg/dl  POCT glycosylated hemoglobin (Hb A1C)  Result Value Ref Range   Hemoglobin A1C 6.8      ASSESSMENT AND PLAN: 1. New onset type 2 diabetes mellitus ([place]) -Patient is now in range for diabetes. Encouraged compliance with metformin which she agrees to do. Discussed the importance of healthy eating habits, regular aerobic exercise (at least 150  minutes a week as tolerated) and medication compliance to achieve or maintain control of diabetes. - POCT glucose (manual entry) - POCT glycosylated hemoglobin (Hb A1C) - metFORMIN (GLUCOPHAGE) 500 MG tablet; Take 1 tablet (500 mg total) by mouth daily with breakfast.  Dispense: 90 tablet; Refill: 3 - Blood Glucose Monitoring Suppl (TRUE METRIX METER) w/Device KIT; Use as directed  Dispense: 1 kit; Refill: 0 - glucose blood (TRUE METRIX BLOOD GLUCOSE TEST) test strip; Use as instructed  Dispense: 100 each; Refill: 12 - TRUEPLUS LANCETS 28G MISC; Use as directed  Dispense: 100 each; Refill: 1  2. Morbid obesity ([place]) See #1 above  3. Elevated blood pressure reading Encourage low salt   4. Need for influenza vaccination - Flu Vaccine QUAD 6+ mos PF IM (Fluarix Quad PF)   Patient was given the opportunity to ask questions.  Patient verbalized understanding of the plan and was able to repeat key elements of the plan.   Orders Placed This Encounter  Procedures  . Flu Vaccine QUAD 6+ mos PF IM (Fluarix Quad PF)  . POCT glucose (manual entry)  . POCT glycosylated hemoglobin (Hb A1C)     Requested Prescriptions   Signed Prescriptions Disp Refills  . metFORMIN (GLUCOPHAGE) 500 MG tablet 90 tablet 3    Sig: Take 1 tablet (500 mg total) by mouth daily with breakfast.  . Blood Glucose Monitoring Suppl (TRUE METRIX METER) w/Device KIT 1 kit 0    Sig: Use as directed  . glucose blood (TRUE METRIX BLOOD GLUCOSE TEST) test strip 100 each 12    Sig: Use as instructed  . TRUEPLUS LANCETS 28G MISC 100 each 1    Sig: Use as directed    Return in about 2 months (around 11/02/2017).  Karle Plumber, MD, FACP

## 2017-09-02 NOTE — Patient Instructions (Signed)
Diabetes Mellitus and Food It is important for you to manage your blood sugar (glucose) level. Your blood glucose level can be greatly affected by what you eat. Eating healthier foods in the appropriate amounts throughout the [place] at about the same time each [place] will help you control your blood glucose level. It can also help slow or prevent worsening of your diabetes mellitus. Healthy eating may even help you improve the level of your blood pressure and reach or maintain a healthy weight. General recommendations for healthful eating and cooking habits include:  Eating meals and snacks regularly. Avoid going long periods of time without eating to lose weight.  Eating a diet that consists mainly of plant-based foods, such as fruits, vegetables, nuts, legumes, and whole grains.  Using low-heat cooking methods, such as baking, instead of high-heat cooking methods, such as deep frying.  Work with your dietitian to make sure you understand how to use the Nutrition Facts information on food labels. How can food affect me? Carbohydrates Carbohydrates affect your blood glucose level more than any other type of food. Your dietitian will help you determine how many carbohydrates to eat at each meal and teach you how to count carbohydrates. Counting carbohydrates is important to keep your blood glucose at a healthy level, especially if you are using insulin or taking certain medicines for diabetes mellitus. Alcohol Alcohol can cause sudden decreases in blood glucose (hypoglycemia), especially if you use insulin or take certain medicines for diabetes mellitus. Hypoglycemia can be a life-threatening condition. Symptoms of hypoglycemia (sleepiness, dizziness, and disorientation) are similar to symptoms of having too much alcohol. If your health care provider has given you approval to drink alcohol, do so in moderation and use the following guidelines:  Women should not have more than one drink per [place], and men  should not have more than two drinks per [place]. One drink is equal to: ? 12 oz of beer. ? 5 oz of wine. ? 1 oz of hard liquor.  Do not drink on an empty stomach.  Keep yourself hydrated. Have water, diet soda, or unsweetened iced tea.  Regular soda, juice, and other mixers might contain a lot of carbohydrates and should be counted.  What foods are not recommended? As you make food choices, it is important to remember that all foods are not the same. Some foods have fewer nutrients per serving than other foods, even though they might have the same number of calories or carbohydrates. It is difficult to get your body what it needs when you eat foods with fewer nutrients. Examples of foods that you should avoid that are high in calories and carbohydrates but low in nutrients include:  Trans fats (most processed foods list trans fats on the Nutrition Facts label).  Regular soda.  Juice.  Candy.  Sweets, such as cake, pie, doughnuts, and cookies.  Fried foods.  What foods can I eat? Eat nutrient-rich foods, which will nourish your body and keep you healthy. The food you should eat also will depend on several factors, including:  The calories you need.  The medicines you take.  Your weight.  Your blood glucose level.  Your blood pressure level.  Your cholesterol level.  You should eat a variety of foods, including:  Protein. ? Lean cuts of meat. ? Proteins low in saturated fats, such as fish, egg whites, and beans. Avoid processed meats.  Fruits and vegetables. ? Fruits and vegetables that may help control blood glucose levels, such as apples,   mangoes, and yams.  Dairy products. ? Choose fat-free or low-fat dairy products, such as milk, yogurt, and cheese.  Grains, bread, pasta, and rice. ? Choose whole grain products, such as multigrain bread, whole oats, and brown rice. These foods may help control blood pressure.  Fats. ? Foods containing healthful fats, such as  nuts, avocado, olive oil, canola oil, and fish.  Does everyone with diabetes mellitus have the same meal plan? Because every person with diabetes mellitus is different, there is not one meal plan that works for everyone. It is very important that you meet with a dietitian who will help you create a meal plan that is just right for you. This information is not intended to replace advice given to you by your health care provider. Make sure you discuss any questions you have with your health care provider. Document Released: 07/16/2005 Document Revised: 03/26/2016 Document Reviewed: 09/15/2013 Elsevier Interactive Patient Education  2017 [employer]  

## 2017-09-03 ENCOUNTER — Other Ambulatory Visit: Payer: Self-pay | Admitting: Pharmacist

## 2017-09-03 MED ORDER — GLUCOSE BLOOD VI STRP: ORAL_STRIP | 12 refills | Status: DC

## 2017-09-03 MED ORDER — ACCU-CHEK SOFT TOUCH LANCETS MISC: 12 refills | Status: DC

## 2017-09-03 MED ORDER — ACCU-CHEK AVIVA PLUS W/DEVICE KIT: PACK | 0 refills | Status: DC

## 2017-09-03 MED FILL — ACCU-CHEK AVIVA PLUS METER: W/DEVICE | 30 days supply | Qty: 1 | Fill #0

## 2017-09-03 MED FILL — ACCU-CHEK AVIVA PLUS TEST S: 25 days supply | Qty: 100 | Fill #0

## 2017-09-03 MED FILL — ACCU-CHEK SOFTCLIX LANCETS: 25 days supply | Qty: 100 | Fill #0

## 2017-10-28 ENCOUNTER — Ambulatory Visit: Payer: Self-pay | Admitting: Internal Medicine

## 2018-09-12 ENCOUNTER — Encounter: Payer: Self-pay | Admitting: Internal Medicine

## 2018-09-12 ENCOUNTER — Ambulatory Visit: Payer: Medicaid Other | Attending: Internal Medicine | Admitting: Internal Medicine

## 2018-09-12 VITALS — BP 134/93 | HR 97 | Temp 98.2°F | Resp 16 | Ht 69.0 in | Wt 367.8 lb

## 2018-09-12 DIAGNOSIS — F209 Schizophrenia, unspecified: Secondary | ICD-10-CM | POA: Diagnosis not present

## 2018-09-12 DIAGNOSIS — R0683 Snoring: Secondary | ICD-10-CM | POA: Insufficient documentation

## 2018-09-12 DIAGNOSIS — Z6841 Body Mass Index (BMI) 40.0 and over, adult: Secondary | ICD-10-CM

## 2018-09-12 DIAGNOSIS — Z0000 Encounter for general adult medical examination without abnormal findings: Secondary | ICD-10-CM | POA: Insufficient documentation

## 2018-09-12 DIAGNOSIS — Z7984 Long term (current) use of oral hypoglycemic drugs: Secondary | ICD-10-CM | POA: Insufficient documentation

## 2018-09-12 DIAGNOSIS — Z8249 Family history of ischemic heart disease and other diseases of the circulatory system: Secondary | ICD-10-CM | POA: Insufficient documentation

## 2018-09-12 DIAGNOSIS — Z79899 Other long term (current) drug therapy: Secondary | ICD-10-CM | POA: Diagnosis not present

## 2018-09-12 DIAGNOSIS — E1165 Type 2 diabetes mellitus with hyperglycemia: Secondary | ICD-10-CM | POA: Insufficient documentation

## 2018-09-12 DIAGNOSIS — F319 Bipolar disorder, unspecified: Secondary | ICD-10-CM | POA: Insufficient documentation

## 2018-09-12 DIAGNOSIS — IMO0001 Reserved for inherently not codable concepts without codable children: Secondary | ICD-10-CM

## 2018-09-12 DIAGNOSIS — N930 Postcoital and contact bleeding: Secondary | ICD-10-CM | POA: Insufficient documentation

## 2018-09-12 DIAGNOSIS — Z23 Encounter for immunization: Secondary | ICD-10-CM

## 2018-09-12 DIAGNOSIS — R030 Elevated blood-pressure reading, without diagnosis of hypertension: Secondary | ICD-10-CM | POA: Diagnosis not present

## 2018-09-12 DIAGNOSIS — L709 Acne, unspecified: Secondary | ICD-10-CM

## 2018-09-12 DIAGNOSIS — L700 Acne vulgaris: Secondary | ICD-10-CM | POA: Insufficient documentation

## 2018-09-12 LAB — GLUCOSE, POCT (MANUAL RESULT ENTRY): POC GLUCOSE: 188 mg/dL — AB (ref 70–99)

## 2018-09-12 LAB — POCT GLYCOSYLATED HEMOGLOBIN (HGB A1C): HBA1C, POC (CONTROLLED DIABETIC RANGE): 7.7 % — AB (ref 0.0–7.0)

## 2018-09-12 MED ORDER — METFORMIN HCL 500 MG PO TABS: 500.0000 mg | ORAL_TABLET | Freq: Every day | ORAL | 3 refills | Status: DC

## 2018-09-12 MED ORDER — CLINDAMYCIN PHOSPHATE 1 % EX SOLN: Freq: Two times a day (BID) | CUTANEOUS | 0 refills | Status: DC

## 2018-09-12 MED ORDER — ADAPALENE 0.1 % EX CREA: TOPICAL_CREAM | CUTANEOUS | 0 refills | Status: DC

## 2018-09-12 NOTE — Progress Notes (Signed)
Pt is taking melatonin, latuda and lamotrigine and pt doesn't know the dosage  Pt states her last menstrual period was last year

## 2018-09-12 NOTE — Patient Instructions (Addendum)
Diabetes Mellitus and Nutrition When you have diabetes (diabetes mellitus), it is very important to have healthy eating habits because your blood sugar (glucose) levels are greatly affected by what you eat and drink. Eating healthy foods in the appropriate amounts, at about the same times every [place], can help you:  Control your blood glucose.  Lower your risk of heart disease.  Improve your blood pressure.  Reach or maintain a healthy weight.  Every person with diabetes is different, and each person has different needs for a meal plan. Your health care provider may recommend that you work with a diet and nutrition specialist (dietitian) to make a meal plan that is best for you. Your meal plan may vary depending on factors such as:  The calories you need.  The medicines you take.  Your weight.  Your blood glucose, blood pressure, and cholesterol levels.  Your activity level.  Other health conditions you have, such as heart or kidney disease.  How do carbohydrates affect me? Carbohydrates affect your blood glucose level more than any other type of food. Eating carbohydrates naturally increases the amount of glucose in your blood. Carbohydrate counting is a method for keeping track of how many carbohydrates you eat. Counting carbohydrates is important to keep your blood glucose at a healthy level, especially if you use insulin or take certain oral diabetes medicines. It is important to know how many carbohydrates you can safely have in each meal. This is different for every person. Your dietitian can help you calculate how many carbohydrates you should have at each meal and for snack. Foods that contain carbohydrates include:  Bread, cereal, rice, pasta, and crackers.  Potatoes and corn.  Peas, beans, and lentils.  Milk and yogurt.  Fruit and juice.  Desserts, such as cakes, cookies, ice cream, and candy.  How does alcohol affect me? Alcohol can cause a sudden decrease in  blood glucose (hypoglycemia), especially if you use insulin or take certain oral diabetes medicines. Hypoglycemia can be a life-threatening condition. Symptoms of hypoglycemia (sleepiness, dizziness, and confusion) are similar to symptoms of having too much alcohol. If your health care provider says that alcohol is safe for you, follow these guidelines:  Limit alcohol intake to no more than 1 drink per [place] for nonpregnant women and 2 drinks per [place] for men. One drink equals 12 oz of beer, 5 oz of wine, or 1 oz of hard liquor.  Do not drink on an empty stomach.  Keep yourself hydrated with water, diet soda, or unsweetened iced tea.  Keep in mind that regular soda, juice, and other mixers may contain a lot of sugar and must be counted as carbohydrates.  What are tips for following this plan? Reading food labels  Start by checking the serving size on the label. The amount of calories, carbohydrates, fats, and other nutrients listed on the label are based on one serving of the food. Many foods contain more than one serving per package.  Check the total grams (g) of carbohydrates in one serving. You can calculate the number of servings of carbohydrates in one serving by dividing the total carbohydrates by 15. For example, if a food has 30 g of total carbohydrates, it would be equal to 2 servings of carbohydrates.  Check the number of grams (g) of saturated and trans fats in one serving. Choose foods that have low or no amount of these fats.  Check the number of milligrams (mg) of sodium in one serving.  Most people should limit total sodium intake to less than 2,300 mg per [place].  Always check the nutrition information of foods labeled as "low-fat" or "nonfat". These foods may be higher in added sugar or refined carbohydrates and should be avoided.  Talk to your dietitian to identify your daily goals for nutrients listed on the label. Shopping  Avoid buying canned, premade, or processed foods.  These foods tend to be high in fat, sodium, and added sugar.  Shop around the outside edge of the grocery store. This includes fresh fruits and vegetables, bulk grains, fresh meats, and fresh dairy. Cooking  Use low-heat cooking methods, such as baking, instead of high-heat cooking methods like deep frying.  Cook using healthy oils, such as olive, canola, or sunflower oil.  Avoid cooking with butter, cream, or high-fat meats. Meal planning  Eat meals and snacks regularly, preferably at the same times every [place]. Avoid going long periods of time without eating.  Eat foods high in fiber, such as fresh fruits, vegetables, beans, and whole grains. Talk to your dietitian about how many servings of carbohydrates you can eat at each meal.  Eat 4-6 ounces of lean protein each [place], such as lean meat, chicken, fish, eggs, or tofu. 1 ounce is equal to 1 ounce of meat, chicken, or fish, 1 egg, or 1/4 cup of tofu.  Eat some foods each [place] that contain healthy fats, such as avocado, nuts, seeds, and fish. Lifestyle   Check your blood glucose regularly.  Exercise at least 30 minutes 5 or more days each week, or as told by your health care provider.  Take medicines as told by your health care provider.  Do not use any products that contain nicotine or tobacco, such as cigarettes and e-cigarettes. If you need help quitting, ask your health care provider.  Work with a Veterinary surgeon or diabetes educator to identify strategies to manage stress and any emotional and social challenges. What are some questions to ask my health care provider?  Do I need to meet with a diabetes educator?  Do I need to meet with a dietitian?  What number can I call if I have questions?  When are the best times to check my blood glucose? Where to find more information:  American Diabetes Association: diabetes.org/food-and-fitness/food  Academy of Nutrition and Dietetics:  https://www.vargas.com/  [employer] of Diabetes and Digestive and Kidney Diseases (NIH): FindJewelers.cz Summary  A healthy meal plan will help you control your blood glucose and maintain a healthy lifestyle.  Working with a diet and nutrition specialist (dietitian) can help you make a meal plan that is best for you.  Keep in mind that carbohydrates and alcohol have immediate effects on your blood glucose levels. It is important to count carbohydrates and to use alcohol carefully. This information is not intended to replace advice given to you by your health care provider. Make sure you discuss any questions you have with your health care provider. Document Released: 07/16/2005 Document Revised: 11/23/2016 Document Reviewed: 11/23/2016 Elsevier Interactive Patient Education  2018 Elsevier Inc. Influenza Virus Vaccine injection (Fluarix) What is this medicine? INFLUENZA VIRUS VACCINE (in floo EN zuh VAHY ruhs vak SEEN) helps to reduce the risk of getting influenza also known as the flu. This medicine may be used for other purposes; ask your health care provider or pharmacist if you have questions. COMMON BRAND NAME(S): Fluarix, Fluzone What should I tell my health care provider before I take this medicine? They need to know if  you have any of these conditions: -bleeding disorder like hemophilia -fever or infection -Guillain-Barre syndrome or other neurological problems -immune system problems -infection with the human immunodeficiency virus (HIV) or AIDS -low blood platelet counts -multiple sclerosis -an unusual or allergic reaction to influenza virus vaccine, eggs, chicken proteins, latex, gentamicin, other medicines, foods, dyes or preservatives -pregnant or trying to get pregnant -breast-feeding How should I use this medicine? This vaccine is for injection into a  muscle. It is given by a health care professional. A copy of Vaccine Information Statements will be given before each vaccination. Read this sheet carefully each time. The sheet may change frequently. Talk to your pediatrician regarding the use of this medicine in children. Special care may be needed. Overdosage: If you think you have taken too much of this medicine contact a poison control center or emergency room at once. NOTE: This medicine is only for you. Do not share this medicine with others. What if I miss a dose? This does not apply. What may interact with this medicine? -chemotherapy or radiation therapy -medicines that lower your immune system like etanercept, anakinra, infliximab, and adalimumab -medicines that treat or prevent blood clots like warfarin -phenytoin -steroid medicines like prednisone or cortisone -theophylline -vaccines This list may not describe all possible interactions. Give your health care provider a list of all the medicines, herbs, non-prescription drugs, or dietary supplements you use. Also tell them if you smoke, drink alcohol, or use illegal drugs. Some items may interact with your medicine. What should I watch for while using this medicine? Report any side effects that do not go away within 3 days to your doctor or health care professional. Call your health care provider if any unusual symptoms occur within 6 weeks of receiving this vaccine. You may still catch the flu, but the illness is not usually as bad. You cannot get the flu from the vaccine. The vaccine will not protect against colds or other illnesses that may cause fever. The vaccine is needed every year. What side effects may I notice from receiving this medicine? Side effects that you should report to your doctor or health care professional as soon as possible: -allergic reactions like skin rash, itching or hives, swelling of the face, lips, or tongue Side effects that usually do not require  medical attention (report to your doctor or health care professional if they continue or are bothersome): -fever -headache -muscle aches and pains -pain, tenderness, redness, or swelling at site where injected -weak or tired This list may not describe all possible side effects. Call your doctor for medical advice about side effects. You may report side effects to FDA at 1-800-FDA-1088. Where should I keep my medicine? This vaccine is only given in a clinic, pharmacy, doctor's office, or other health care setting and will not be stored at home. NOTE: This sheet is a summary. It may not cover all possible information. If you have questions about this medicine, talk to your doctor, pharmacist, or health care provider.  2018 Elsevier/Gold Standard (2008-05-16 09:30:40)  Pneumococcal Polysaccharide Vaccine: What You Need to Know 1. Why get vaccinated? Vaccination can protect older adults (and some children and younger adults) from pneumococcal disease. Pneumococcal disease is caused by bacteria that can spread from person to person through close contact. It can cause ear infections, and it can also lead to more serious infections of the:  Lungs (pneumonia),  Blood (bacteremia), and  Covering of the brain and spinal cord (meningitis). Meningitis can cause deafness  and brain damage, and it can be fatal.  Anyone can get pneumococcal disease, but children under 53 years of age, people with certain medical conditions, adults over 66 years of age, and cigarette smokers are at the highest risk. About 18,000 older adults die each year from pneumococcal disease in the [place]. Treatment of pneumococcal infections with penicillin and other drugs used to be more effective. But some strains of the disease have become resistant to these drugs. This makes prevention of the disease, through vaccination, even more important. 2. Pneumococcal polysaccharide vaccine (PPSV23) Pneumococcal polysaccharide  vaccine (PPSV23) protects against 23 types of pneumococcal bacteria. It will not prevent all pneumococcal disease. PPSV23 is recommended for:  All adults 48 years of age and older,  Anyone 2 through 24 years of age with certain long-term health problems,  Anyone 2 through 24 years of age with a weakened immune system,  Adults 6 through 23 years of age who smoke cigarettes or have asthma.  Most people need only one dose of PPSV. A second dose is recommended for certain high-risk groups. People 53 and older should get a dose even if they have gotten one or more doses of the vaccine before they turned 65. Your healthcare provider can give you more information about these recommendations. Most healthy adults develop protection within 2 to 3 weeks of getting the shot. 3. Some people should not get this vaccine  Anyone who has had a life-threatening allergic reaction to PPSV should not get another dose.  Anyone who has a severe allergy to any component of PPSV should not receive it. Tell your provider if you have any severe allergies.  Anyone who is moderately or severely ill when the shot is scheduled may be asked to wait until they recover before getting the vaccine. Someone with a mild illness can usually be vaccinated.  Children less than 58 years of age should not receive this vaccine.  There is no evidence that PPSV is harmful to either a pregnant woman or to her fetus. However, as a precaution, women who need the vaccine should be vaccinated before becoming pregnant, if possible. 4. Risks of a vaccine reaction With any medicine, including vaccines, there is a chance of side effects. These are usually mild and go away on their own, but serious reactions are also possible. About half of people who get PPSV have mild side effects, such as redness or pain where the shot is given, which go away within about two days. Less than 1 out of 100 people develop a fever, muscle aches, or more severe  local reactions. Problems that could happen after any vaccine:  People sometimes faint after a medical procedure, including vaccination. Sitting or lying down for about 15 minutes can help prevent fainting, and injuries caused by a fall. Tell your doctor if you feel dizzy, or have vision changes or ringing in the ears.  Some people get severe pain in the shoulder and have difficulty moving the arm where a shot was given. This happens very rarely.  Any medication can cause a severe allergic reaction. Such reactions from a vaccine are very rare, estimated at about 1 in a million doses, and would happen within a few minutes to a few hours after the vaccination. As with any medicine, there is a very remote chance of a vaccine causing a serious injury or death. The safety of vaccines is always being monitored. For more information, visit: http://floyd.org/ 5. What if there is a serious reaction?  What should I look for? Look for anything that concerns you, such as signs of a severe allergic reaction, very high fever, or unusual behavior. Signs of a severe allergic reaction can include hives, swelling of the face and throat, difficulty breathing, a fast heartbeat, dizziness, and weakness. These would usually start a few minutes to a few hours after the vaccination. What should I do? If you think it is a severe allergic reaction or other emergency that can't wait, call 9-1-1 or get to the nearest hospital. Otherwise, call your doctor. Afterward, the reaction should be reported to the Vaccine Adverse Event Reporting System (VAERS). Your doctor might file this report, or you can do it yourself through the VAERS web site at www.vaers.LAgents.no, or by calling 1-(959)004-7574. VAERS does not give medical advice. 6. How can I learn more?  Ask your doctor. He or she can give you the vaccine package insert or suggest other sources of information.  Call your local or state health department.  Contact  the Centers for Disease Control and Prevention (CDC): ? Call 226-376-5384 (1-800-CDC-INFO) or ? Visit CDC's website at PicCapture.uy CDC Pneumococcal Polysaccharide Vaccine VIS (02/23/14) This information is not intended to replace advice given to you by your health care provider. Make sure you discuss any questions you have with your health care provider. Document Released: 08/16/2006 Document Revised: 07/09/2016 Document Reviewed: 07/09/2016 Elsevier Interactive Patient Education  2017 [employer].

## 2018-09-12 NOTE — Progress Notes (Signed)
Patient ID: Cindy Leonard, female    DOB: January 30, 1996  MRN: [id number]  CC: Diabetes (PRE-DIABETES) and Annual Exam   Subjective: Cindy Leonard is a 22 y.o. female who presents for to become reestablish and for a general checkup. Her concerns today include:  Patient with history of prediabetes, schizophrenia, bipolar disorder, obesity.  Patient last seen about 1 year ago.  She had moved to [place] to help to care for her grandmother.  She has several concerns today.  DM:  Out of Metformin for a while.  She reports that she walks daily for 20 minutes.  Her walk takes her up and down her hill.  She reports that she stopped eating fast foods and fried foods.  Drinks mainly water.  She is frustrated that she has not loss weight.  Denies any numbness or tingling in the hands or feet.  No blurred vision.  Complains of skin breaking out on her face for over a year.  She has been using an over-the-counter cream called Noxzema which has not helped.  She has noticed a lot of whiteheads over the cheeks.  Her other concern is that she has been told that she snores very loud and "has problems breathing in my sleep."  Noted to have periods where she stops breathing.  She wakes up feeling refreshed, denies morning headaches but does feel tired during the [place].   Other concern is both cortical bleeding.  She states that when she has sex in the missionary position she does not have this problem.  It only occurs when she has penetration from behind.  She is up-to-date with her pap smear.     Schizophrenia/bipolar: She has change mental health providers.  She is now being followed at [facility].   Patient Active Problem List   Diagnosis Date Noted  . Prediabetes 03/22/2017  . Morbid obesity ([place]) 03/22/2017  . Hx of bipolar disorder 03/22/2017  . Schizophrenia ([place]) 03/22/2017     Current Outpatient Medications on File Prior to Visit  Medication Sig Dispense Refill  . lamoTRIgine (LAMICTAL) 100 MG  tablet Take 200 mg by mouth daily.    Marland Kitchen lurasidone (LATUDA) 40 MG TABS tablet Take 40 mg by mouth daily with breakfast.    . Melatonin 5 MG TABS Take by mouth.    . Blood Glucose Monitoring Suppl (ACCU-CHEK AVIVA PLUS) w/Device KIT Use as directed (Patient not taking: Reported on 09/12/2018) 1 kit 0  . glucose blood (ACCU-CHEK AVIVA PLUS) test strip Use as instructed 100 each 12  . Lancets (ACCU-CHEK SOFT TOUCH) lancets Use as instructed 100 each 12  . medroxyPROGESTERone (PROVERA) 5 MG tablet Take 1 tablet (5 mg total) by mouth daily. 10 tablet 0   No current facility-administered medications on file prior to visit.     Allergies  Allergen Reactions  . Food Swelling    Eyes/throat swelling (blueberries)    Social History   Socioeconomic History  . Marital status: Single    Spouse name: Not on file  . Number of children: Not on file  . Years of education: Not on file  . Highest education level: Not on file  Occupational History  . Not on file  Social Needs  . Financial resource strain: Not on file  . Food insecurity:    Worry: Not on file    Inability: Not on file  . Transportation needs:    Medical: Not on file    Non-medical: Not on file  Tobacco  Use  . Smoking status: Never Smoker  . Smokeless tobacco: Never Used  Substance and Sexual Activity  . Alcohol use: No  . Drug use: No    Comment: clean of cocaine x 3 yrs  . Sexual activity: Yes    Birth control/protection: None  Lifestyle  . Physical activity:    Days per week: Not on file    Minutes per session: Not on file  . Stress: Not on file  Relationships  . Social connections:    Talks on phone: Not on file    Gets together: Not on file    Attends religious service: Not on file    Active member of club or organization: Not on file    Attends meetings of clubs or organizations: Not on file    Relationship status: Not on file  . Intimate partner violence:    Fear of current or ex partner: Not on file     Emotionally abused: Not on file    Physically abused: Not on file    Forced sexual activity: Not on file  Other Topics Concern  . Not on file  Social History Narrative  . Not on file    Family History  Problem Relation Age of Onset  . Stroke Mother   . Diabetes Other   . Hypertension Other   . Asthma Father   . Asthma Brother     No past surgical history on file.  ROS: Review of Systems  HENT: Negative for congestion.   Eyes: Negative for visual disturbance.  Respiratory: Negative for chest tightness and shortness of breath.   Cardiovascular: Negative for chest pain and palpitations.  Gastrointestinal: Negative for abdominal pain.   Negative except as above. PHYSICAL EXAM: BP (!) 134/93   Pulse 97   Temp 98.2 F (36.8 C) (Oral)   Resp 16   Ht '5\' 9"'  (1.753 m)   Wt (!) 367 lb 12.8 oz (166.8 kg)   SpO2 95%   BMI 54.31 kg/m   Physical Exam BP 130/95 General appearance - alert, well appearing, morbidly obese young African-American female and in no distress Mental status - normal mood, behavior, speech, dress, motor activity, and thought processes Eyes - pupils equal and reactive, extraocular eye movements intact Nose - normal and patent, no erythema, discharge or polyps Mouth - mucous membranes moist, pharynx normal without lesions Neck - supple, no thyroid enlargement or thyroid nodules  Lymphatics -no cervical axillary lymphadenopathy Chest - clear to auscultation, no wheezes, rales or rhonchi, symmetric air entry Heart - normal rate, regular rhythm, normal S1, S2, no murmurs, rubs, clicks or gallops Pelvic -patient declined pelvic exam Extremities - peripheral pulses normal, no pedal edema, no clubbing or cyanosis Skin -hyperpigmentation over the cheeks.  She has moderate amount of acne with whiteheads especially over the left cheek  Results for orders placed or performed in visit on 09/12/18  Microalbumin / creatinine urine ratio  Result Value Ref Range    Creatinine, Urine 155.6 Not Estab. mg/dL   Microalbumin, Urine 19.5 Not Estab. ug/mL   Microalb/Creat Ratio 12.5 0.0 - 30.0 mg/g creat  POCT glucose (manual entry)  Result Value Ref Range   POC Glucose 188 (A) 70 - 99 mg/dl  POCT glycosylated hemoglobin (Hb A1C)  Result Value Ref Range   Hemoglobin A1C     HbA1c POC (<> result, manual entry)     HbA1c, POC (prediabetic range)     HbA1c, POC (controlled diabetic range) 7.7 (A) 0.0 -  7.0 %    ASSESSMENT AND PLAN:  1. Diabetes mellitus type 2, uncontrolled, without complications (HCC) Discussed the importance of healthy eating habits, regular aerobic exercise (at least 150 minutes a week as tolerated) and medication compliance to achieve or maintain control of diabetes. Restart metformin.  Patient agreeable to referral to see a nutritionist for dietary counseling.  Encouraged her to continue regular exercise but try to get up to 30 minutes 4 times a week. - POCT glucose (manual entry) - POCT glycosylated hemoglobin (Hb A1C) - Microalbumin / creatinine urine ratio - metFORMIN (GLUCOPHAGE) 500 MG tablet; Take 1 tablet (500 mg total) by mouth daily with breakfast.  Dispense: 90 tablet; Refill: 3  2. Morbid obesity ([place]) See #1 above - Amb ref to Medical Nutrition Therapy-MNT  3. Elevated blood pressure reading DASH diet discussed and encouraged.  Went over goals of blood pressure.  Will recheck on follow-up visit  4. Loud snoring Most likely has sleep apnea.  Discussed sleep apnea with patient.  I recommend referral for sleep study and she is agreeable to that. - [facility] Sleep Study; Future  5. Acne, unspecified acne type - clindamycin (CLEOCIN T) 1 % external solution; Apply topically 2 (two) times daily. Apply to facial acne  Dispense: 30 mL; Refill: 0 - adapalene (DIFFERIN) 0.1 % cream; Apply topically at bedtime three times a week to facial acne  Dispense: 45 g; Refill: 0  6. PCB (post coital bleeding) Most likely due to tear in the  vaginal skin due to increased friction.  Patient declined pelvic exam today.  Will refer to GYN  7. Need for immunization against influenza - Flu Vaccine QUAD 36+ mos IM   Patient was given the opportunity to ask questions.  Patient verbalized understanding of the plan and was able to repeat key elements of the plan.   Orders Placed This Encounter  Procedures  . Flu Vaccine QUAD 36+ mos IM  . Pneumococcal polysaccharide vaccine 23-valent greater than or equal to 2yo subcutaneous/IM  . Microalbumin / creatinine urine ratio  . Amb ref to Medical Nutrition Therapy-MNT  . POCT glucose (manual entry)  . POCT glycosylated hemoglobin (Hb A1C)  . PSG Sleep Study     Requested Prescriptions   Signed Prescriptions Disp Refills  . clindamycin (CLEOCIN T) 1 % external solution 30 mL 0    Sig: Apply topically 2 (two) times daily. Apply to facial acne  . adapalene (DIFFERIN) 0.1 % cream 45 g 0    Sig: Apply topically at bedtime three times a week to facial acne  . metFORMIN (GLUCOPHAGE) 500 MG tablet 90 tablet 3    Sig: Take 1 tablet (500 mg total) by mouth daily with breakfast.    No follow-ups on file.  Karle Plumber, MD, FACP

## 2018-09-13 LAB — MICROALBUMIN / CREATININE URINE RATIO
Creatinine, Urine: 155.6 mg/dL
MICROALB/CREAT RATIO: 12.5 mg/g{creat} (ref 0.0–30.0)
MICROALBUM., U, RANDOM: 19.5 ug/mL

## 2018-09-14 DIAGNOSIS — L709 Acne, unspecified: Secondary | ICD-10-CM | POA: Insufficient documentation

## 2018-09-14 DIAGNOSIS — R030 Elevated blood-pressure reading, without diagnosis of hypertension: Secondary | ICD-10-CM | POA: Insufficient documentation

## 2018-09-23 ENCOUNTER — Encounter: Payer: Medicaid Other | Attending: Internal Medicine | Admitting: Registered"

## 2018-09-23 ENCOUNTER — Encounter: Payer: Self-pay | Admitting: Registered"

## 2018-09-23 DIAGNOSIS — Z713 Dietary counseling and surveillance: Secondary | ICD-10-CM | POA: Diagnosis present

## 2018-09-23 DIAGNOSIS — E119 Type 2 diabetes mellitus without complications: Secondary | ICD-10-CM

## 2018-09-23 NOTE — Patient Instructions (Addendum)
-   Aim to include breakfast such as greek yogurt or cereal + milk or grits + protein (bacon, sausage, eggs, cheese)  - Aim to have breakfast at least 2 days during school week and once on weekend.

## 2018-09-23 NOTE — Progress Notes (Signed)
  Medical Nutrition Therapy:  Appt start time: 10:35 end time:  11:43.   Assessment:  Primary concerns today: wants to lose weight especially under her stomach. Lab results show recent A1c 7.7  Pt expectations: meal plan  Pt states she does not drink sodas, kool aid; does not eat candy, does not like chocolate. Pt states she typically eats larger meals on Sundays like fried chicken, fried cabbage, macaroni and cheese, and other cultural items prepared by step-mom or grandma. Pt reports typically eating food in larger portions in one meal. Pt shows me pictures from cell phone of a typical meal for her: larger than average portions. Pt states she can eat a whole box of macaroni and cheese/alfredo in one sitting. Pt states she does not have time to cook. Pt states she is in school 8:30 am - 6 pm (M-Th) pursuing GED.   Pt states she feels embarrassed about weight when going to appointments because her weight continues to increase.   Pt states she feels dizzy sometimes after eating, head feels low, and experiences shakiness sometimes.    Preferred Learning Style:   No preference indicated   Learning Readiness:   Contemplating  Ready  Change in progress   MEDICATIONS: See list   DIETARY INTAKE:  Usual eating pattern includes 2 meals and 6-12 snacks per [place].  Everyday foods include honey buns, tacos, rice, beans, .  Avoided foods include none stated.    24-hr recall:  B ( AM): skips or [place]-grits, biscuit (egg + bacon) Snk ( AM): 6 honey buns   L (11:30 AM): rice + beans + chicken + burritos or 2 plates of 10 steak tacos + rice + beans or chinese food or pizza Snk ( PM): 6 honey buns  D (7:30 PM): sandwich  Snk ( PM): pizza rolls, sausage sandwiches Beverages: water with flavor packs, orange/cranberry juice  Usual physical activity: ADLs  Estimated energy needs: 2400 calories 270 g carbohydrates 180 g protein 67 g fat  Progress Towards Goal(s):  In  progress.   Nutritional Diagnosis:  NB-1.1 Food and nutrition-related knowledge deficit As related to diabetes.  As evidenced by pt verbalizes incomplete knowledge.    Intervention:  Nutrition education and counseling. Pt was educated and counseled on the diabetes, risk factors, benefits of eating 3 meals a [place], and importance of having breakfast. Pt was in agreement with goals listed.  Goals: - Aim to include breakfast such as greek yogurt or cereal + milk or grits + protein (bacon, sausage, eggs, cheese) - Aim to have breakfast at least 2 days during school week and once on weekend.   Teaching Method Utilized:  Visual Auditory Hands on  Handouts given during visit include:  Diabetes: Your take control guide  Barriers to learning/adherence to lifestyle change: contemplative stage of change  Demonstrated degree of understanding via:  Teach Back   Monitoring/Evaluation:  Dietary intake, exercise, and body weight in 2 week(s).

## 2018-10-07 ENCOUNTER — Encounter: Payer: Medicaid Other | Attending: Internal Medicine | Admitting: Registered"

## 2018-10-07 ENCOUNTER — Encounter: Payer: Self-pay | Admitting: Registered"

## 2018-10-07 DIAGNOSIS — E119 Type 2 diabetes mellitus without complications: Secondary | ICD-10-CM

## 2018-10-07 DIAGNOSIS — Z713 Dietary counseling and surveillance: Secondary | ICD-10-CM | POA: Diagnosis present

## 2018-10-07 NOTE — Progress Notes (Signed)
  Medical Nutrition Therapy:  Appt start time: 10:20 end time:  11:19   Assessment:  Primary concerns today: wants to lose weight especially under her stomach. Lab results show recent A1c 7.7  Pt expectations: meal plan  Pt states she EBT benefits have been reduced to $10/month. Pt states she has only eaten breakfast once since previous visit due to lack of food at home. Pt states she does not have access to a car to travel to local pantries or food resources. Pt states she does not like to ask for help with things because she is used to doing everything on her own since she was 22 years old. Pt states she has not eaten since lunchtime yesterday due to limited access to food.   Pt states her dad has diabetes. Pt was given glucometer Accucheck Guide Me Lot # [id number] Exp 10/05/2019 Blood sugar value tested in office: FBS (125)  Pt states she does not drink sodas, kool aid; does not eat candy, does not like chocolate. Pt states she typically eats larger meals on Sundays like fried chicken, fried cabbage, macaroni and cheese, and other cultural items prepared by step-mom or grandma. Pt reports typically eating food in larger portions in one meal. Pt shows me pictures from cell phone of a typical meal for her: larger than average portions. Pt states she can eat a whole box of macaroni and cheese/alfredo in one sitting. Pt states she does not have time to cook. Pt states she is in school 8:30 am - 6 pm (M-Th) pursuing GED.   Pt states she feels embarrassed about weight when going to appointments because her weight continues to increase.   Pt states she feels dizzy sometimes after eating, head feels low, and experiences shakiness sometimes.    Preferred Learning Style:   No preference indicated   Learning Readiness:   Contemplating  Ready  Change in progress   MEDICATIONS: See list   DIETARY INTAKE:  Usual eating pattern includes 2 meals and 6-12 snacks per [place].  Everyday foods  include honey buns, tacos, rice, beans.  Avoided foods include none stated.    24-hr recall:  B ( AM): skips or [place]-grits + biscuit (egg + bacon) Snk ( AM):  L (11:30 AM): rice noodles + vegetables Snk ( PM):  D (7:30 PM):   Snk ( PM):  Beverages: water with flavor packs, orange juice  Usual physical activity: ADLs, walking to and from bus stop daily  Estimated energy needs: 2400 calories 270 g carbohydrates 180 g protein 67 g fat  Progress Towards Goal(s):  In progress.   Nutritional Diagnosis:  NB-3.2 Limited access to food or water As related to  lack of financial resources .  As evidenced by pt verbalizes skipping meals due to food availability.    Intervention:  Nutrition education and counseling. Pt was counseled on local food resources and educated on how to check blood sugar. Pt was in agreement with goals listed.  Goals: - Ask friend for transportation to [employer] 4[place] Fri each month 1 pm. See handouts.  - Trying to eat breakfast.  - Check blood sugars in the mornings.  Teaching Method Utilized:  Visual Auditory Hands on  Handouts given during visit include:  Diabetes: Your take control guide  Barriers to learning/adherence to lifestyle change: contemplative stage of change  Demonstrated degree of understanding via:  Teach Back   Monitoring/Evaluation:  Dietary intake, exercise, and body weight in 1 month(s).

## 2018-10-07 NOTE — Patient Instructions (Signed)
-   Ask friend for transportation to [employer] [place] each month 1 pm. See handouts.   - Trying to eat breakfast.   - Check blood sugars in the mornings.

## 2018-10-31 ENCOUNTER — Encounter: Payer: Medicaid Other | Admitting: Obstetrics and Gynecology

## 2018-10-31 ENCOUNTER — Encounter: Payer: Self-pay | Admitting: Obstetrics and Gynecology

## 2018-11-03 ENCOUNTER — Encounter: Payer: Self-pay | Admitting: Internal Medicine

## 2018-11-03 NOTE — Progress Notes (Signed)
Patient did not keep her GYN referral appointment for 10/31/2018.  Rorik Vespa, Jr MD Attending [facility] (Faculty Practice)   

## 2018-11-03 NOTE — Progress Notes (Signed)
Received message from GYN, DR. Pickens that pt no-showed her appt 10/31/2018.

## 2018-11-04 ENCOUNTER — Ambulatory Visit (HOSPITAL_BASED_OUTPATIENT_CLINIC_OR_DEPARTMENT_OTHER): Payer: Medicaid Other | Attending: Internal Medicine

## 2018-11-18 ENCOUNTER — Ambulatory Visit: Payer: Medicaid Other | Admitting: Internal Medicine

## 2018-11-18 ENCOUNTER — Encounter: Payer: Self-pay | Admitting: Registered"

## 2018-11-18 ENCOUNTER — Encounter: Payer: Medicaid Other | Attending: Internal Medicine | Admitting: Registered"

## 2018-11-18 DIAGNOSIS — Z713 Dietary counseling and surveillance: Secondary | ICD-10-CM | POA: Diagnosis present

## 2018-11-18 DIAGNOSIS — E119 Type 2 diabetes mellitus without complications: Secondary | ICD-10-CM

## 2018-11-18 NOTE — Progress Notes (Signed)
  Medical Nutrition Therapy:  Appt start time: 10:55 end time: 11:40   Assessment:  Primary concerns today: wants to lose weight especially under her stomach. Lab results show recent A1c 7.7  Pt expectations: meal plan  Pt states she has been intentional about drinking water and eating breakfast. Pt reports she will be receiving an increase in EBT benefits and monthly income. Pt states she does not cook often but her roommate does. Pt states she was checking BS until she ran out of strips. Pt states she experienced hypoglycemia a few times since previous visit and drinks juice to raise blood sugar. Pt states she has a new job and will graduate in May. Pt has started eating more vegetables with her meals.    Preferred Learning Style:   No preference indicated   Learning Readiness:   Contemplating  Ready  Change in progress   MEDICATIONS: See list   DIETARY INTAKE:  Usual eating pattern includes 2 meals and 6-12 snacks per [place].  Everyday foods include honey buns, tacos, rice, beans.  Avoided foods include none stated.    24-hr recall:  B ( AM): eggs + bacon or grits or hot pocket or fruit Snk ( AM):  L (11:30 AM): rice + baked chicken Snk ( PM):  D (7:30 PM): sometimes skips; spinach salad + chicken + eggs or shrimp + crab legs Snk ( PM):  Beverages: water with flavor packs, orange juice, ginger ale  Usual physical activity: ADLs, walking to and from bus stop daily  Estimated energy needs: 2400 calories 270 g carbohydrates 180 g protein 67 g fat  Progress Towards Goal(s):  In progress.   Nutritional Diagnosis:  NB-3.2 Limited access to food or water As related to  lack of financial resources .  As evidenced by pt verbalizes skipping meals due to food availability.    Intervention:  Nutrition education and counseling. Pt was encouraged to continue with the great changes she has made thus far. Pt was encouraged to balance carbohydrates with protein for breakfast and  check BS more often once she has more strips. Pt was in agreement with goals listed.  Goals: - Have protein and carbohydrates as breakfast option such as grits + bacon + eggs or fruit + peanut butter or cheese toast, etc.  - Aim to have vegetables with lunch and dinner along with protein and carbohydrates.  - Check blood sugar 3-4 times a [place]: before breakfast and 2 hours after meals.  - Continue do an amazing job with behavior changes related to food and drinking water!  Teaching Method Utilized:  Visual Auditory Hands on  Handouts given during visit include:  none  Barriers to learning/adherence to lifestyle change: contemplative stage of change  Demonstrated degree of understanding via:  Teach Back   Monitoring/Evaluation:  Dietary intake, exercise, and body weight. Follow-up in  2-3 weeks.

## 2018-11-18 NOTE — Patient Instructions (Addendum)
-   Have protein and carbohydrates as breakfast option such as grits + bacon + eggs or fruit + peanut butter or cheese toast, etc.   - Aim to have vegetables with lunch and dinner along with protein and carbohydrates.   - Check blood sugar 3-4 times a [place]: before breakfast and 2 hours after meals.   - Continue do an amazing job with behavior changes related to food and drinking water!

## 2018-12-09 ENCOUNTER — Ambulatory Visit: Payer: Medicaid Other | Admitting: Registered"

## 2018-12-23 ENCOUNTER — Ambulatory Visit: Payer: Medicaid Other | Admitting: Family Medicine

## 2019-01-23 ENCOUNTER — Other Ambulatory Visit: Payer: Self-pay

## 2019-01-23 ENCOUNTER — Emergency Department (HOSPITAL_COMMUNITY)
Admission: EM | Admit: 2019-01-23 | Discharge: 2019-01-23 | Disposition: A | Discharge status: Home / Self Care | Payer: Medicaid Other | Attending: Emergency Medicine | Admitting: Emergency Medicine

## 2019-01-23 ENCOUNTER — Encounter (HOSPITAL_COMMUNITY): Payer: Self-pay | Admitting: *Deleted

## 2019-01-23 DIAGNOSIS — R509 Fever, unspecified: Secondary | ICD-10-CM | POA: Diagnosis not present

## 2019-01-23 DIAGNOSIS — Z79899 Other long term (current) drug therapy: Secondary | ICD-10-CM | POA: Diagnosis not present

## 2019-01-23 NOTE — ED Provider Notes (Signed)
[facility] Provider Note   CSN: [id number] Arrival date & time: 01/23/19  1509    History   Chief Complaint Chief Complaint  Patient presents with  . Fever    HPI Cindy Leonard is a 23 y.o. female who presents with request for a work note.  Past medical history significant for obesity, diabetes, bipolar disorder.  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  That employee told her supervisor that she was sick and that the patient was around her when she was sick.  The patient had a fever of 103 on Saturday but did not have any other symptoms with it.  She denies URI symptoms, headache, chest pain, shortness breath, abdominal pain, nausea, vomiting, diarrhea, urinary symptoms.  She has not taken any medicines for her symptoms because the fever was 1 night and has not been recurrent and she just wants to go back to work.  Her employer is requiring her to get a note to go back.  She denies any travel or known sick contacts with coronavirus     HPI  Past Medical History:  Diagnosis Date  . Bipolar 1 disorder ([place])   . Depression   . Schizophrenia [facility])     Patient Active Problem List   Diagnosis Date Noted  . Elevated blood pressure reading 09/14/2018  . Acne 09/14/2018  . Morbid obesity ([place]) 03/22/2017  . Hx of bipolar disorder 03/22/2017  . Schizophrenia ([place]) 03/22/2017    History reviewed. No pertinent surgical history.   OB History    Gravida  1   Para      Term      Preterm      AB      Living        SAB      TAB      Ectopic      Multiple      Live Births               Home Medications    Prior to Admission medications   Medication Sig Start Date End Date Taking? Authorizing Provider  adapalene (DIFFERIN) 0.1 % cream Apply topically at bedtime three times a week to facial acne 09/12/18   Ladell Pier, MD  Blood Glucose  Monitoring Suppl (ACCU-CHEK AVIVA PLUS) w/Device KIT Use as directed 09/03/17   Ladell Pier, MD  clindamycin (CLEOCIN T) 1 % external solution Apply topically 2 (two) times daily. Apply to facial acne 09/12/18   Ladell Pier, MD  glucose blood (ACCU-CHEK AVIVA PLUS) test strip Use as instructed 09/03/17   Ladell Pier, MD  lamoTRIgine (LAMICTAL) 100 MG tablet Take 200 mg by mouth daily.    [provider]  Lancets (ACCU-CHEK SOFT TOUCH) lancets Use as instructed 09/03/17   Ladell Pier, MD  lurasidone (LATUDA) 40 MG TABS tablet Take 40 mg by mouth daily with breakfast.    [provider]  medroxyPROGESTERone (PROVERA) 5 MG tablet Take 1 tablet (5 mg total) by mouth daily. 02/08/17 02/18/17  Duffy Bruce, MD  Melatonin 5 MG TABS Take by mouth.    [provider]  metFORMIN (GLUCOPHAGE) 500 MG tablet Take 1 tablet (500 mg total) by mouth daily with breakfast. 09/12/18   Ladell Pier, MD    Family History Family History  Problem Relation Age of Onset  . Stroke Mother   .  Diabetes Other   . Hypertension Other   . Asthma Father   . Asthma Brother     Social History Social History   Tobacco Use  . Smoking status: Never Smoker  . Smokeless tobacco: Never Used  Substance Use Topics  . Alcohol use: No  . Drug use: No    Comment: clean of cocaine x 3 yrs     Allergies   Food   Review of Systems Review of Systems  Constitutional: Positive for fever.  HENT: Negative for congestion, ear pain, postnasal drip, rhinorrhea and sore throat.   Respiratory: Negative for cough and shortness of breath.   Cardiovascular: Negative for chest pain.  All other systems reviewed and are negative.    Physical Exam Updated Vital Signs BP 109/87 (BP Location: Right Arm)   Pulse 75   Temp 98.2 F (36.8 C) (Oral)   Resp 16   Ht '5\' 8"'  (1.727 m)   Wt (!) 147 kg   SpO2 99%   BMI 49.26 kg/m   Physical Exam Vitals signs and nursing note  reviewed.  Constitutional:      General: She is not in acute distress.    Appearance: She is well-developed. She is obese. She is not ill-appearing.     Comments: Calm, cooperative, well-appearing  HENT:     Head: Normocephalic and atraumatic.     Right Ear: Tympanic membrane normal.     Left Ear: Tympanic membrane normal.     Nose: Nose normal.  Eyes:     General: No scleral icterus.       Right eye: No discharge.        Left eye: No discharge.     Conjunctiva/sclera: Conjunctivae normal.     Pupils: Pupils are equal, round, and reactive to light.  Neck:     Musculoskeletal: Normal range of motion.  Cardiovascular:     Rate and Rhythm: Normal rate and regular rhythm.  Pulmonary:     Effort: Pulmonary effort is normal. No respiratory distress.     Breath sounds: Normal breath sounds.  Abdominal:     General: There is no distension.  Skin:    General: Skin is warm and dry.  Neurological:     Mental Status: She is alert and oriented to person, place, and time.  Psychiatric:        Behavior: Behavior normal.      ED Treatments / Results  Labs (all labs ordered are listed, but only abnormal results are displayed) Labs Reviewed - No data to display  EKG None  Radiology No results found.  Procedures Procedures (including critical care time)  Medications Ordered in ED Medications - No data to display   Initial Impression / Assessment and Plan / ED Course  I have reviewed the triage vital signs and the nursing notes.  Pertinent labs & imaging results that were available during my care of the patient were reviewed by me and considered in my medical decision making (see chart for details).  23 year old female presents primarily to get a work note.  She is well-appearing here.  Vital signs are normal.  She had a fever of 103 on Saturday but has not had any recurrent fever and it has not been taking any medicines to reduce her fever.  She was given a work note for  Wednesday.  Low suspicion for COVID, flu, pneumonia. Return precautions given  Final Clinical Impressions(s) / ED Diagnoses   Final diagnoses:  Fever, unspecified fever  cause    ED Discharge Orders    None       Iris Pert 01/23/19 1606    Elnora Morrison, MD 01/23/19 Einar Crow

## 2019-01-23 NOTE — ED Notes (Signed)
Patient verbalizes understanding of discharge instructions . Opportunity for questions and answers were provided . Armband removed by staff ,Pt discharged from ED. W/C  offered at D/C  and Declined W/C at D/C and was escorted to lobby by RN.  

## 2019-02-07 ENCOUNTER — Encounter: Payer: Self-pay | Admitting: *Deleted

## 2019-02-08 ENCOUNTER — Encounter: Payer: Self-pay | Admitting: *Deleted

## 2020-03-28 ENCOUNTER — Inpatient Hospital Stay
Admission: EM | Admit: 2020-03-28 | Discharge: 2020-04-04 | DRG: 638 | Disposition: A | Discharge status: Other Acute Inpatient Hospital | Payer: Medicaid Other | Attending: Internal Medicine | Admitting: Internal Medicine

## 2020-03-28 ENCOUNTER — Other Ambulatory Visit: Payer: Self-pay

## 2020-03-28 DIAGNOSIS — E860 Dehydration: Secondary | ICD-10-CM | POA: Diagnosis present

## 2020-03-28 DIAGNOSIS — L709 Acne, unspecified: Secondary | ICD-10-CM | POA: Diagnosis not present

## 2020-03-28 DIAGNOSIS — Z7984 Long term (current) use of oral hypoglycemic drugs: Secondary | ICD-10-CM | POA: Diagnosis not present

## 2020-03-28 DIAGNOSIS — B373 Candidiasis of vulva and vagina: Secondary | ICD-10-CM | POA: Diagnosis present

## 2020-03-28 DIAGNOSIS — K859 Acute pancreatitis without necrosis or infection, unspecified: Secondary | ICD-10-CM | POA: Diagnosis not present

## 2020-03-28 DIAGNOSIS — Z9114 Patient's other noncompliance with medication regimen: Secondary | ICD-10-CM | POA: Diagnosis not present

## 2020-03-28 DIAGNOSIS — E1165 Type 2 diabetes mellitus with hyperglycemia: Secondary | ICD-10-CM | POA: Diagnosis not present

## 2020-03-28 DIAGNOSIS — E1110 Type 2 diabetes mellitus with ketoacidosis without coma: Principal | ICD-10-CM | POA: Diagnosis present

## 2020-03-28 DIAGNOSIS — B370 Candidal stomatitis: Secondary | ICD-10-CM | POA: Diagnosis present

## 2020-03-28 DIAGNOSIS — R45851 Suicidal ideations: Secondary | ICD-10-CM | POA: Diagnosis present

## 2020-03-28 DIAGNOSIS — F209 Schizophrenia, unspecified: Secondary | ICD-10-CM | POA: Diagnosis not present

## 2020-03-28 DIAGNOSIS — E0865 Diabetes mellitus due to underlying condition with hyperglycemia: Secondary | ICD-10-CM | POA: Diagnosis not present

## 2020-03-28 DIAGNOSIS — E871 Hypo-osmolality and hyponatremia: Secondary | ICD-10-CM | POA: Diagnosis present

## 2020-03-28 DIAGNOSIS — Z6841 Body Mass Index (BMI) 40.0 and over, adult: Secondary | ICD-10-CM

## 2020-03-28 DIAGNOSIS — F23 Brief psychotic disorder: Secondary | ICD-10-CM | POA: Diagnosis not present

## 2020-03-28 DIAGNOSIS — Z9119 Patient's noncompliance with other medical treatment and regimen: Secondary | ICD-10-CM

## 2020-03-28 DIAGNOSIS — Z9111 Patient's noncompliance with dietary regimen: Secondary | ICD-10-CM | POA: Diagnosis not present

## 2020-03-28 DIAGNOSIS — Z8659 Personal history of other mental and behavioral disorders: Secondary | ICD-10-CM

## 2020-03-28 DIAGNOSIS — F250 Schizoaffective disorder, bipolar type: Secondary | ICD-10-CM | POA: Diagnosis present

## 2020-03-28 DIAGNOSIS — N179 Acute kidney failure, unspecified: Secondary | ICD-10-CM | POA: Diagnosis present

## 2020-03-28 DIAGNOSIS — Z79899 Other long term (current) drug therapy: Secondary | ICD-10-CM

## 2020-03-28 DIAGNOSIS — Z20822 Contact with and (suspected) exposure to covid-19: Secondary | ICD-10-CM | POA: Diagnosis present

## 2020-03-28 DIAGNOSIS — F329 Major depressive disorder, single episode, unspecified: Secondary | ICD-10-CM | POA: Diagnosis not present

## 2020-03-28 DIAGNOSIS — F32A Depression, unspecified: Secondary | ICD-10-CM | POA: Diagnosis present

## 2020-03-28 DIAGNOSIS — Z833 Family history of diabetes mellitus: Secondary | ICD-10-CM | POA: Diagnosis not present

## 2020-03-28 LAB — HEPATIC FUNCTION PANEL
ALT: 26 U/L (ref 0–44)
AST: 29 U/L (ref 15–41)
Albumin: 3.9 g/dL (ref 3.5–5.0)
Alkaline Phosphatase: 104 U/L (ref 38–126)
Bilirubin, Direct: 0.3 mg/dL — ABNORMAL HIGH (ref 0.0–0.2)
Indirect Bilirubin: 1.7 mg/dL — ABNORMAL HIGH (ref 0.3–0.9)
Total Bilirubin: 2 mg/dL — ABNORMAL HIGH (ref 0.3–1.2)
Total Protein: 8.6 g/dL — ABNORMAL HIGH (ref 6.5–8.1)

## 2020-03-28 LAB — BASIC METABOLIC PANEL
Anion gap: 24 — ABNORMAL HIGH (ref 5–15)
Anion gap: 19 — ABNORMAL HIGH (ref 5–15)
Anion gap: 16 — ABNORMAL HIGH (ref 5–15)
BUN: 28 mg/dL — ABNORMAL HIGH (ref 6–20)
BUN: 27 mg/dL — ABNORMAL HIGH (ref 6–20)
BUN: 24 mg/dL — ABNORMAL HIGH (ref 6–20)
CO2: 14 mmol/L — ABNORMAL LOW (ref 22–32)
CO2: 16 mmol/L — ABNORMAL LOW (ref 22–32)
CO2: 17 mmol/L — ABNORMAL LOW (ref 22–32)
Calcium: 9.3 mg/dL (ref 8.9–10.3)
Calcium: 8.9 mg/dL (ref 8.9–10.3)
Calcium: 9.1 mg/dL (ref 8.9–10.3)
Chloride: 89 mmol/L — ABNORMAL LOW (ref 98–111)
Chloride: 99 mmol/L (ref 98–111)
Chloride: 105 mmol/L (ref 98–111)
Creatinine, Ser: 1.65 mg/dL — ABNORMAL HIGH (ref 0.44–1.00)
Creatinine, Ser: 1.47 mg/dL — ABNORMAL HIGH (ref 0.44–1.00)
Creatinine, Ser: 1.42 mg/dL — ABNORMAL HIGH (ref 0.44–1.00)
GFR calc Af Amer: 49 mL/min — ABNORMAL LOW (ref >60)
GFR calc Af Amer: 57 mL/min — ABNORMAL LOW (ref >60)
GFR calc Af Amer: 59 mL/min — ABNORMAL LOW (ref >60)
GFR calc non Af Amer: 43 mL/min — ABNORMAL LOW (ref >60)
GFR calc non Af Amer: 49 mL/min — ABNORMAL LOW (ref >60)
GFR calc non Af Amer: 51 mL/min — ABNORMAL LOW (ref >60)
Glucose, Bld: 979 mg/dL (ref 70–99)
Glucose, Bld: 607 mg/dL (ref 70–99)
Glucose, Bld: 362 mg/dL — ABNORMAL HIGH (ref 70–99)
Potassium: 5.7 mmol/L — ABNORMAL HIGH (ref 3.5–5.1)
Potassium: 4.1 mmol/L (ref 3.5–5.1)
Potassium: 4 mmol/L (ref 3.5–5.1)
Sodium: 127 mmol/L — ABNORMAL LOW (ref 135–145)
Sodium: 134 mmol/L — ABNORMAL LOW (ref 135–145)
Sodium: 138 mmol/L (ref 135–145)

## 2020-03-28 LAB — URINALYSIS, COMPLETE (UACMP) WITH MICROSCOPIC
Bilirubin Urine: NEGATIVE
Glucose, UA: >=500 mg/dL — AB
Ketones, ur: 80 mg/dL — AB
Nitrite: NEGATIVE
Protein, ur: NEGATIVE mg/dL
RBC / HPF: >50 RBC/hpf — ABNORMAL HIGH (ref 0–5)
Specific Gravity, Urine: 1.033 — ABNORMAL HIGH (ref 1.005–1.030)
pH: 5 (ref 5.0–8.0)

## 2020-03-28 LAB — HCG, QUANTITATIVE, PREGNANCY: hCG, Beta Chain, Quant, S: <1 m[IU]/mL (ref <5)

## 2020-03-28 LAB — CBC
HCT: 48.3 % — ABNORMAL HIGH (ref 36.0–46.0)
Hemoglobin: 14.8 g/dL (ref 12.0–15.0)
MCH: 26.4 pg (ref 26.0–34.0)
MCHC: 30.6 g/dL (ref 30.0–36.0)
MCV: 86.1 fL (ref 80.0–100.0)
Platelets: 373 10*3/uL (ref 150–400)
RBC: 5.61 MIL/uL — ABNORMAL HIGH (ref 3.87–5.11)
RDW: 15.9 % — ABNORMAL HIGH (ref 11.5–15.5)
WBC: 14.7 10*3/uL — ABNORMAL HIGH (ref 4.0–10.5)
nRBC: 0 % (ref 0.0–0.2)

## 2020-03-28 LAB — GLUCOSE, CAPILLARY
Glucose-Capillary: 248 mg/dL — ABNORMAL HIGH (ref 70–99)
Glucose-Capillary: 261 mg/dL — ABNORMAL HIGH (ref 70–99)
Glucose-Capillary: 303 mg/dL — ABNORMAL HIGH (ref 70–99)
Glucose-Capillary: 356 mg/dL — ABNORMAL HIGH (ref 70–99)
Glucose-Capillary: 366 mg/dL — ABNORMAL HIGH (ref 70–99)
Glucose-Capillary: 436 mg/dL — ABNORMAL HIGH (ref 70–99)
Glucose-Capillary: 495 mg/dL — ABNORMAL HIGH (ref 70–99)
Glucose-Capillary: 528 mg/dL (ref 70–99)
Glucose-Capillary: 540 mg/dL (ref 70–99)
Glucose-Capillary: 557 mg/dL (ref 70–99)
Glucose-Capillary: >600 mg/dL (ref 70–99)

## 2020-03-28 LAB — BETA-HYDROXYBUTYRIC ACID
Beta-Hydroxybutyric Acid: 6.21 mmol/L — ABNORMAL HIGH (ref 0.05–0.27)
Beta-Hydroxybutyric Acid: 6.21 mmol/L — ABNORMAL HIGH (ref 0.05–0.27)

## 2020-03-28 LAB — SARS CORONAVIRUS 2 BY RT PCR (HOSPITAL ORDER, PERFORMED IN ~~LOC~~ HOSPITAL LAB): SARS Coronavirus 2: NEGATIVE

## 2020-03-28 LAB — POC URINE PREG, ED: Preg Test, Ur: NEGATIVE

## 2020-03-28 LAB — LIPASE, BLOOD: Lipase: 514 U/L — ABNORMAL HIGH (ref 11–51)

## 2020-03-28 LAB — MAGNESIUM: Magnesium: 2.5 mg/dL — ABNORMAL HIGH (ref 1.7–2.4)

## 2020-03-28 LAB — HIV ANTIBODY (ROUTINE TESTING W REFLEX): HIV Screen 4th Generation wRfx: NONREACTIVE

## 2020-03-28 MED ORDER — INSULIN REGULAR(HUMAN) IN NACL 100-0.9 UT/100ML-% IV SOLN
INTRAVENOUS | Status: DC
  Administered 2020-03-28: 6 [IU]/h via INTRAVENOUS
  Filled 2020-03-28 (×2): qty 100

## 2020-03-28 MED ORDER — LACTATED RINGERS IV BOLUS: 1000.0000 mL | Freq: Once | INTRAVENOUS | Status: DC

## 2020-03-28 MED ORDER — ALUM & MAG HYDROXIDE-SIMETH 200-200-20 MG/5ML PO SUSP
30.0000 mL | Freq: Once | ORAL | Status: AC
  Administered 2020-03-28: 30 mL via ORAL
  Filled 2020-03-28: qty 30

## 2020-03-28 MED ORDER — LIDOCAINE VISCOUS HCL 2 % MT SOLN: 15.0000 mL | Freq: Once | OROMUCOSAL | Status: DC

## 2020-03-28 MED ORDER — LIDOCAINE VISCOUS HCL 2 % MT SOLN
15.0000 mL | Freq: Once | OROMUCOSAL | Status: AC
  Administered 2020-03-28: 15 mL via ORAL
  Filled 2020-03-28: qty 15

## 2020-03-28 MED ORDER — MELATONIN 5 MG PO TABS
5.0000 mg | ORAL_TABLET | Freq: Every day | ORAL | Status: DC
  Administered 2020-03-29 – 2020-04-03 (×6): 5 mg via ORAL
  Filled 2020-03-28 (×8): qty 1

## 2020-03-28 MED ORDER — ALUM & MAG HYDROXIDE-SIMETH 200-200-20 MG/5ML PO SUSP: 30.0000 mL | Freq: Once | ORAL | Status: DC

## 2020-03-28 MED ORDER — DEXTROSE-NACL 5-0.45 % IV SOLN: INTRAVENOUS | Status: DC

## 2020-03-28 MED ORDER — SODIUM CHLORIDE 0.9 % IV SOLN: INTRAVENOUS | Status: DC

## 2020-03-28 MED ORDER — LAMOTRIGINE 100 MG PO TABS
200.0000 mg | ORAL_TABLET | Freq: Every day | ORAL | Status: DC
  Administered 2020-03-29 – 2020-04-04 (×7): 200 mg via ORAL
  Filled 2020-03-28 (×7): qty 2

## 2020-03-28 MED ORDER — ENOXAPARIN SODIUM 40 MG/0.4ML ~~LOC~~ SOLN
40.0000 mg | SUBCUTANEOUS | Status: DC
  Administered 2020-03-28: 40 mg via SUBCUTANEOUS
  Filled 2020-03-28: qty 0.4

## 2020-03-28 MED ORDER — DEXTROSE 50 % IV SOLN
0.0000 mL | INTRAVENOUS | Status: DC | PRN
Start: 2020-03-28 — End: 2020-03-28

## 2020-03-28 MED ORDER — SODIUM CHLORIDE 0.9 % IV BOLUS
1000.0000 mL | INTRAVENOUS | Status: AC
  Administered 2020-03-28 (×2): 1000 mL via INTRAVENOUS

## 2020-03-28 MED ORDER — INSULIN REGULAR(HUMAN) IN NACL 100-0.9 UT/100ML-% IV SOLN: INTRAVENOUS | Status: DC

## 2020-03-28 MED ORDER — LURASIDONE HCL 40 MG PO TABS
40.0000 mg | ORAL_TABLET | Freq: Every day | ORAL | Status: DC
  Administered 2020-03-29 – 2020-04-02 (×5): 40 mg via ORAL
  Filled 2020-03-28 (×6): qty 1

## 2020-03-28 MED ORDER — DEXTROSE 50 % IV SOLN: 0.0000 mL | INTRAVENOUS | Status: DC | PRN

## 2020-03-28 NOTE — H&P (Signed)
History and Physical    Britton Perkinson Micheletti PTW:[id number] DOB: 1995-12-13 DOA: 03/28/2020  PCP: Ladell Pier, MD   Patient coming from: Home  I have personally briefly reviewed patient's old medical records in [place]  Chief Complaint:  Weakness Most of the history was obtained from her mother at the bedside as well as from ER notes   HPI: Cindy Leonard is a 24 y.o. female with medical history significant for bipolar disorder, schizophrenia, morbid obesity and type 2 diabetes mellitus who presents to the emergency room for evaluation of generalized weakness, polydipsia and polyuria for about 2 weeks.  Patient reports that for the last 3 days she has been so weak that she has been laying on the floor and urinating on herself. She has not been compliant with her prescribed medications because she does not like the way her medications make her feel. She denies having any chest pain, shortness of breath, fever, chills, cough, headache or abdominal pain Patient also reported to the ED provider that she feels more depressed and suicidal but does not have a specific plan to harm herself. Labs revealed significant hyperglycemia with serum glucose of about 900 on admission, serum sodium 127, potassium of 5.7, creatinine of 1.65 compared to a baseline of 0.9 and lipase of 514. She also has a white count of 14.7 and an anion gap of 24    ED Course: 24 year old female with a history of schizophrenia and diabetes mellitus type 2 who presents to the emergency room for evaluation of weakness and is found to be in DKA.  She also has significantly elevated lipase levels.  She will be admitted to the hospital for further evaluation.  Review of Systems: As per HPI otherwise 10 point review of systems negative.    Past Medical History:  Diagnosis Date  . Bipolar 1 disorder ([place])   . Depression   . Schizophrenia ([place])     History reviewed. No pertinent surgical history.   reports that  she has never smoked. She has never used smokeless tobacco. She reports that she does not drink alcohol or use drugs.  Allergies  Allergen Reactions  . Food Swelling    Eyes/throat swelling (blueberries)    Family History  Problem Relation Age of Onset  . Stroke Mother   . Diabetes Other   . Hypertension Other   . Asthma Father   . Asthma Brother      Prior to Admission medications   Medication Sig Start Date End Date Taking? Authorizing Provider  adapalene (DIFFERIN) 0.1 % cream Apply topically at bedtime three times a week to facial acne 09/12/18   Ladell Pier, MD  Blood Glucose Monitoring Suppl (ACCU-CHEK AVIVA PLUS) w/Device KIT Use as directed 09/03/17   Ladell Pier, MD  clindamycin (CLEOCIN T) 1 % external solution Apply topically 2 (two) times daily. Apply to facial acne 09/12/18   Ladell Pier, MD  glucose blood (ACCU-CHEK AVIVA PLUS) test strip Use as instructed 09/03/17   Ladell Pier, MD  lamoTRIgine (LAMICTAL) 100 MG tablet Take 200 mg by mouth daily.    [provider]  Lancets (ACCU-CHEK SOFT TOUCH) lancets Use as instructed 09/03/17   Ladell Pier, MD  lurasidone (LATUDA) 40 MG TABS tablet Take 40 mg by mouth daily with breakfast.    [provider]  medroxyPROGESTERone (PROVERA) 5 MG tablet Take 1 tablet (5 mg total) by mouth daily. 02/08/17 02/18/17  Duffy Bruce, MD  Melatonin 5 MG TABS Take by mouth.    [provider]  metFORMIN (GLUCOPHAGE) 500 MG tablet Take 1 tablet (500 mg total) by mouth daily with breakfast. 09/12/18   Ladell Pier, MD    Physical Exam: Vitals:   03/28/20 1048 03/28/20 1104 03/28/20 1530 03/28/20 1531  BP:  129/89 (!) 149/105   Pulse:  95  97  Resp:  16 (!) 30 (!) 30  Temp:  98.5 F (36.9 C)    TempSrc:  Oral    SpO2:  98%  96%  Weight: 136.1 kg     Height: '5\' 7"'  (1.702 m)        Vitals:   03/28/20 1048 03/28/20 1104 03/28/20 1530 03/28/20 1531  BP:  129/89 (!)  149/105   Pulse:  95  97  Resp:  16 (!) 30 (!) 30  Temp:  98.5 F (36.9 C)    TempSrc:  Oral    SpO2:  98%  96%  Weight: 136.1 kg     Height: '5\' 7"'  (1.702 m)       Constitutional: Lethargic but arouses to loud verbal stimuli.  Morbidly obese Eyes: PERRL, lids and conjunctivae normal ENMT: Mucous membranes are moist.  Neck: normal, supple, no masses, no thyromegaly Respiratory: clear to auscultation bilaterally, no wheezing, no crackles. Normal respiratory effort. No accessory muscle use.  Cardiovascular: Regular rate and rhythm, no murmurs / rubs / gallops. No extremity edema. 2+ pedal pulses. No carotid bruits.  Abdomen: no tenderness, no masses palpated. No hepatosplenomegaly. Bowel sounds positive. Central adiposity Musculoskeletal: no clubbing / cyanosis. No joint deformity upper and lower extremities.  Skin: no rashes, lesions, ulcers.  Neurologic: Unable to assess Psychiatric: Normal mood and affect.   Labs on Admission: I have personally reviewed following labs and imaging studies  CBC: Recent Labs  Lab 03/28/20 1059  WBC 14.7*  HGB 14.8  HCT 48.3*  MCV 86.1  PLT 335   Basic Metabolic Panel: Recent Labs  Lab 03/28/20 1059  NA 127*  K 5.7*  CL 89*  CO2 14*  GLUCOSE 979*  BUN 28*  CREATININE 1.65*  CALCIUM 9.3  MG 2.5*   GFR: Estimated Creatinine Clearance: 75.2 mL/min (A) (by C-G formula based on SCr of 1.65 mg/dL (H)). Liver Function Tests: Recent Labs  Lab 03/28/20 1059  AST 29  ALT 26  ALKPHOS 104  BILITOT 2.0*  PROT 8.6*  ALBUMIN 3.9   Recent Labs  Lab 03/28/20 1059  LIPASE 514*   No results for input(s): AMMONIA in the last 168 hours. Coagulation Profile: No results for input(s): INR, PROTIME in the last 168 hours. Cardiac Enzymes: No results for input(s): CKTOTAL, CKMB, CKMBINDEX, TROPONINI in the last 168 hours. BNP (last 3 results) No results for input(s): PROBNP in the last 8760 hours. HbA1C: No results for input(s): HGBA1C in  the last 72 hours. CBG: Recent Labs  Lab 03/28/20 1406 03/28/20 1505 03/28/20 1535 03/28/20 1606  GLUCAP >600* 528* 540* 557*   Lipid Profile: No results for input(s): CHOL, HDL, LDLCALC, TRIG, CHOLHDL, LDLDIRECT in the last 72 hours. Thyroid Function Tests: No results for input(s): TSH, T4TOTAL, FREET4, T3FREE, THYROIDAB in the last 72 hours. Anemia Panel: No results for input(s): VITAMINB12, FOLATE, FERRITIN, TIBC, IRON, RETICCTPCT in the last 72 hours. Urine analysis:    Component Value Date/Time   COLORURINE YELLOW 07/23/2016 2105   APPEARANCEUR CLOUDY (A) 07/23/2016 2105   LABSPEC 1.020 07/23/2016 2105   PHURINE 5.5 07/23/2016 2105  GLUCOSEU NEGATIVE 07/23/2016 2105   HGBUR NEGATIVE 07/23/2016 2105   BILIRUBINUR NEGATIVE 07/23/2016 2105   [place] NEGATIVE 07/23/2016 2105   PROTEINUR NEGATIVE 07/23/2016 2105   NITRITE NEGATIVE 07/23/2016 2105   LEUKOCYTESUR SMALL (A) 07/23/2016 2105    Radiological Exams on Admission: No results found.  EKG: Independently reviewed.   Assessment/Plan Principal Problem:   DKA (diabetic ketoacidoses) (HCC) Active Problems:   Morbid obesity ([place])   Depression   Hyponatremia    DKA Patient with a history of type 2 diabetes mellitus who has been noncompliant with prescribed medications Patient's underlying psychiatric history is also a contributory factor for her noncompliance She presented to the emergency room for evaluation of weakness and is found to have an anion gap of 24 with significant hyperglycemia We will place patient on insulin drip per protocol IV fluid hydration Check serial electrolytes She will get diabetic education and dietary consult    Hyponatremia Related to significant hyperglycemia Expect improvement in serum sodium levels with IV fluid hydration    Morbid obesity (BMI 61.95 kg/m2) Complicates overall prognosis and care    Depression with suicidal ideation We will place patient on one-to-one  suicide watch We will request psychiatry consult Continue Lamictal and Latuda     DVT prophylaxis: Lovenox Code Status: Full Family Communication: Greater than 50% of time was spent discussing patient's condition and plan of care with her at the bedside. All questions and concerns have been addressed. Disposition Plan: Back to previous home environment Consults called: Psychiatry    Collier Bullock MD [facility]     03/28/2020, 4:25 PM

## 2020-03-28 NOTE — ED Triage Notes (Addendum)
Pt comes into the ED via EMS from home with c/o laying on a mattress on the floor urinating on herself for the past 2 weeks has not taken her daily psych or medical medications. Pt is covered in urine on arrival to the [facility], while in the [facility] the pt urinated on herself. Pt is a/ox4. Pt states she is going through a bought of depression. States she is having suicidal thought with no plan. Pt is able to stand in triage to clean and help change herself.

## 2020-03-28 NOTE — ED Notes (Signed)
EDT asked to sit in room as a SI sitter. Pt sleeping in bed, mother in room also.

## 2020-03-28 NOTE — ED Notes (Signed)
Pt transferred to room 1 via stretcher by this RN. Pt placed in clean brief by this RN and Geraldine Contras, Charity fundraiser. Pt placed on monitor by this RN. Purewick applied by this RN.

## 2020-03-28 NOTE — ED Notes (Signed)
Pt repositioned in bed at this time for comfort.

## 2020-03-28 NOTE — ED Notes (Signed)
Psych NP at bedside with patient and mother. Pt remains very drowsy at this time.

## 2020-03-28 NOTE — ED Notes (Signed)
Changed patients bed external catheter came out. Patient complaining of chest pain and stomach pain.AS

## 2020-03-28 NOTE — ED Notes (Signed)
Pt initially noted to be somnolent while admitting MD at bedside. Pt now noted to be waking up and speaking, able to follow commands while being changed. Pt noted to be able to wake up when this RN explained she could not eat due to her being sleepy. Pt's mom at bedside. Air adjusted in room due to patient c/o it being hot. Pt's mom initially called out due to "she said it was hot and and then she started breathing really fast". Pt awake stating "I love you Lynna Zamorano! You saved my life!"

## 2020-03-28 NOTE — ED Notes (Signed)
Lab at bedside to redraw labs

## 2020-03-28 NOTE — ED Provider Notes (Signed)
[facility] Emergency Department Provider Note  ____________________________________________  Time seen: Approximately 2:33 PM  I have reviewed the triage vital signs and the nursing notes.   HISTORY  Chief Complaint Hyperglycemia and Depression    HPI Cindy Leonard is a 24 y.o. female with a history of bipolar disorder, schizophrenia, type 2 diabetes who comes the ED complaining of generalized weakness and polyuria for the past 2 weeks, polydipsia.  Reports for the last 3 days she has been so weak that she has been laying on the floor urinating on herself.  No aggravating or alleviating factors.  She also reports that she feels more depressed and suicidal.  No specific plan to harm herself.  Patient notes that she has not been taking any of her medication for the past month because she does not like the way her schizophrenia med makes her feel, and she believes the only thing that she is being given for diabetes is fingerstick without Metformin glipizide or insulin.      Past Medical History:  Diagnosis Date  . Bipolar 1 disorder ([place])   . Depression   . Schizophrenia [facility])      Patient Active Problem List   Diagnosis Date Noted  . Elevated blood pressure reading 09/14/2018  . Acne 09/14/2018  . Morbid obesity ([place]) 03/22/2017  . Hx of bipolar disorder 03/22/2017  . Schizophrenia ([place]) 03/22/2017     History reviewed. No pertinent surgical history.   Prior to Admission medications   Medication Sig Start Date End Date Taking? Authorizing Provider  adapalene (DIFFERIN) 0.1 % cream Apply topically at bedtime three times a week to facial acne 09/12/18   Ladell Pier, MD  Blood Glucose Monitoring Suppl (ACCU-CHEK AVIVA PLUS) w/Device KIT Use as directed 09/03/17   Ladell Pier, MD  clindamycin (CLEOCIN T) 1 % external solution Apply topically 2 (two) times daily. Apply to facial acne 09/12/18   Ladell Pier, MD  glucose blood  (ACCU-CHEK AVIVA PLUS) test strip Use as instructed 09/03/17   Ladell Pier, MD  lamoTRIgine (LAMICTAL) 100 MG tablet Take 200 mg by mouth daily.    [provider]  Lancets (ACCU-CHEK SOFT TOUCH) lancets Use as instructed 09/03/17   Ladell Pier, MD  lurasidone (LATUDA) 40 MG TABS tablet Take 40 mg by mouth daily with breakfast.    [provider]  medroxyPROGESTERone (PROVERA) 5 MG tablet Take 1 tablet (5 mg total) by mouth daily. 02/08/17 02/18/17  Duffy Bruce, MD  Melatonin 5 MG TABS Take by mouth.    [provider]  metFORMIN (GLUCOPHAGE) 500 MG tablet Take 1 tablet (500 mg total) by mouth daily with breakfast. 09/12/18   Ladell Pier, MD     Allergies Food   Family History  Problem Relation Age of Onset  . Stroke Mother   . Diabetes Other   . Hypertension Other   . Asthma Father   . Asthma Brother     Social History Social History   Tobacco Use  . Smoking status: Never Smoker  . Smokeless tobacco: Never Used  Substance Use Topics  . Alcohol use: No  . Drug use: No    Comment: clean of cocaine x 3 yrs    Review of Systems  Constitutional:   No fever or chills.  ENT:   No sore throat. No rhinorrhea. Cardiovascular:   No chest pain or syncope. Respiratory:   No dyspnea or cough. Gastrointestinal:   Negative  for abdominal pain, vomiting and diarrhea.  Musculoskeletal:   Negative for focal pain or swelling All other systems reviewed and are negative except as documented above in ROS and HPI.  ____________________________________________   PHYSICAL EXAM:  VITAL SIGNS: ED Triage Vitals  Enc Vitals Group     BP 03/28/20 1104 129/89     Pulse Rate 03/28/20 1104 95     Resp 03/28/20 1104 16     Temp 03/28/20 1104 98.5 F (36.9 C)     Temp Source 03/28/20 1104 Oral     SpO2 03/28/20 1104 98 %     Weight 03/28/20 1048 300 lb (136.1 kg)     Height 03/28/20 1048 '5\' 7"'  (1.702 m)     Head Circumference --      Peak Flow  --      Pain Score 03/28/20 1048 5     Pain Loc --      Pain Edu? --      Excl. in [place]? --     Vital signs reviewed, nursing assessments reviewed.   Constitutional:   Alert and oriented.  Ill-appearing.  Morbidly obese Eyes:   Conjunctivae are normal. EOMI. PERRL. ENT      Head:   Normocephalic and atraumatic.      Nose:   Normal      Mouth/Throat:   Dry mucous membranes.      Neck:   No meningismus. Full ROM. Hematological/Lymphatic/Immunilogical:   No cervical lymphadenopathy. Cardiovascular:   RRR. Symmetric bilateral radial and DP pulses.  No murmurs. Cap refill less than 2 seconds. Respiratory:   Normal respiratory effort without tachypnea/retractions. Breath sounds are clear and equal bilaterally. No wheezes/rales/rhonchi. Gastrointestinal:   Soft without focal tenderness. Non distended. There is no CVA tenderness.  No rebound, rigidity, or guarding.  Musculoskeletal:   Normal range of motion in all extremities. No joint effusions.  No lower extremity tenderness.  No edema. Neurologic:   Normal speech and language.  Motor grossly intact. No acute focal neurologic deficits are appreciated.  Skin:    Skin is warm, dry and intact. No rash noted.  No petechiae, purpura, or bullae.  ____________________________________________    LABS (pertinent positives/negatives) (all labs ordered are listed, but only abnormal results are displayed) Labs Reviewed  BASIC METABOLIC PANEL - Abnormal; Notable for the following components:      Result Value   Sodium 127 (*)    Potassium 5.7 (*)    Chloride 89 (*)    CO2 14 (*)    Glucose, Bld 979 (*)    BUN 28 (*)    Creatinine, Ser 1.65 (*)    GFR calc non Af Amer 43 (*)    GFR calc Af Amer 49 (*)    Anion gap 24 (*)    All other components within normal limits  CBC - Abnormal; Notable for the following components:   WBC 14.7 (*)    RBC 5.61 (*)    HCT 48.3 (*)    RDW 15.9 (*)    All other components within normal limits   BETA-HYDROXYBUTYRIC ACID - Abnormal; Notable for the following components:   Beta-Hydroxybutyric Acid 6.21 (*)    All other components within normal limits  HEPATIC FUNCTION PANEL - Abnormal; Notable for the following components:   Total Protein 8.6 (*)    Total Bilirubin 2.0 (*)    Bilirubin, Direct 0.3 (*)    Indirect Bilirubin 1.7 (*)    All other components within normal  limits  LIPASE, BLOOD - Abnormal; Notable for the following components:   Lipase 514 (*)    All other components within normal limits  MAGNESIUM - Abnormal; Notable for the following components:   Magnesium 2.5 (*)    All other components within normal limits  GLUCOSE, CAPILLARY - Abnormal; Notable for the following components:   Glucose-Capillary >600 (*)    All other components within normal limits  SARS CORONAVIRUS 2 BY RT PCR (HOSPITAL ORDER, [facility])  HCG, QUANTITATIVE, PREGNANCY  URINALYSIS, COMPLETE (UACMP) WITH MICROSCOPIC  CBG MONITORING, ED  CBG MONITORING, ED  POC URINE PREG, ED  POC URINE PREG, ED   ____________________________________________   EKG    ____________________________________________    RADIOLOGY  No results found.  ____________________________________________   PROCEDURES .Critical Care Performed by: Carrie Mew, MD Authorized by: Carrie Mew, MD   Critical care provider statement:    Critical care time (minutes):  35   Critical care time was exclusive of:  Separately billable procedures and treating other patients   Critical care was necessary to treat or prevent imminent or life-threatening deterioration of the following conditions:  Metabolic crisis, endocrine crisis and dehydration   Critical care was time spent personally by me on the following activities:  Development of treatment plan with patient or surrogate, discussions with consultants, evaluation of patient's response to treatment, examination of patient,  obtaining history from patient or surrogate, ordering and performing treatments and interventions, ordering and review of laboratory studies, ordering and review of radiographic studies, pulse oximetry, re-evaluation of patient's condition and review of old charts    ____________________________________________  DIFFERENTIAL DIAGNOSIS   Dehydration, DKA, electrolyte abnormality, acute psychosis  CLINICAL IMPRESSION / ASSESSMENT AND PLAN / ED COURSE  Medications ordered in the ED: Medications  insulin regular, human (MYXREDLIN) 100 units/ 100 mL infusion (6 Units/hr Intravenous New Bag/Given 03/28/20 1409)  0.9 %  sodium chloride infusion ( Intravenous New Bag/Given 03/28/20 1412)  dextrose 5 %-0.45 % sodium chloride infusion (has no administration in time range)  dextrose 50 % solution 0-50 mL (has no administration in time range)  sodium chloride 0.9 % bolus 1,000 mL (0 mLs Intravenous Stopped 03/28/20 1411)    Pertinent labs & imaging results that were available during my care of the patient were reviewed by me and considered in my medical decision making (see chart for details).  Cindy Leonard was evaluated in Emergency Department on 03/28/2020 for the symptoms described in the history of present illness. She was evaluated in the context of the global COVID-19 pandemic, which necessitated consideration that the patient might be at risk for infection with the SARS-CoV-2 virus that causes COVID-19. Institutional protocols and algorithms that pertain to the evaluation of patients at risk for COVID-19 are in a state of rapid change based on information released by regulatory bodies including the CDC and federal and state organizations. These policies and algorithms were followed during the patient's care in the ED.   Patient presents with polyuria polydipsia, medication noncompliance.  Vital signs are unremarkable and exam is nonfocal.  However blood sugar found to be greater than 600.  Lab  panel reveals a glucose of 980, multiple abnormalities including AKI, hyponatremia, hyperkalemia, elevated lipase, elevated by beta hydroxybutyrate, high anion gap metabolic acidosis consistent with DKA and profound dehydration.  2 L IV saline bolus ordered, insulin drip ordered.  Patient will need to be admitted for further stabilization.  She is Covid negative.  Pregnancy  test negative.      ____________________________________________   FINAL CLINICAL IMPRESSION(S) / ED DIAGNOSES    Final diagnoses:  Diabetic ketoacidosis without coma associated with type 2 diabetes mellitus (HCC)  Schizophrenia, unspecified type ([place])  AKI (acute kidney injury) ([place])  Morbid obesity [facility])     ED Discharge Orders    None      Portions of this note were generated with dragon dictation software. Dictation errors may occur despite best attempts at proofreading.   Carrie Mew, MD 03/28/20 1438

## 2020-03-28 NOTE — ED Notes (Signed)
Pt cleansed of stool at this time by this RN and Lorin Picket, EDT. Purewick replaced and clean brief and chucks placed on patient.
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
## 2020-03-29 DIAGNOSIS — B370 Candidal stomatitis: Secondary | ICD-10-CM

## 2020-03-29 DIAGNOSIS — N179 Acute kidney failure, unspecified: Secondary | ICD-10-CM

## 2020-03-29 LAB — GLUCOSE, CAPILLARY
Glucose-Capillary: 164 mg/dL — ABNORMAL HIGH (ref 70–99)
Glucose-Capillary: 173 mg/dL — ABNORMAL HIGH (ref 70–99)
Glucose-Capillary: 190 mg/dL — ABNORMAL HIGH (ref 70–99)
Glucose-Capillary: 196 mg/dL — ABNORMAL HIGH (ref 70–99)
Glucose-Capillary: 197 mg/dL — ABNORMAL HIGH (ref 70–99)
Glucose-Capillary: 219 mg/dL — ABNORMAL HIGH (ref 70–99)
Glucose-Capillary: 259 mg/dL — ABNORMAL HIGH (ref 70–99)
Glucose-Capillary: 264 mg/dL — ABNORMAL HIGH (ref 70–99)
Glucose-Capillary: 277 mg/dL — ABNORMAL HIGH (ref 70–99)
Glucose-Capillary: 278 mg/dL — ABNORMAL HIGH (ref 70–99)
Glucose-Capillary: 363 mg/dL — ABNORMAL HIGH (ref 70–99)
Glucose-Capillary: 436 mg/dL — ABNORMAL HIGH (ref 70–99)
Glucose-Capillary: 443 mg/dL — ABNORMAL HIGH (ref 70–99)
Glucose-Capillary: 447 mg/dL — ABNORMAL HIGH (ref 70–99)

## 2020-03-29 LAB — BASIC METABOLIC PANEL
Anion gap: 11 (ref 5–15)
Anion gap: 10 (ref 5–15)
Anion gap: 9 (ref 5–15)
Anion gap: 15 (ref 5–15)
Anion gap: 7 (ref 5–15)
BUN: 22 mg/dL — ABNORMAL HIGH (ref 6–20)
BUN: 22 mg/dL — ABNORMAL HIGH (ref 6–20)
BUN: 21 mg/dL — ABNORMAL HIGH (ref 6–20)
BUN: 25 mg/dL — ABNORMAL HIGH (ref 6–20)
BUN: 21 mg/dL — ABNORMAL HIGH (ref 6–20)
CO2: 23 mmol/L (ref 22–32)
CO2: 23 mmol/L (ref 22–32)
CO2: 24 mmol/L (ref 22–32)
CO2: 18 mmol/L — ABNORMAL LOW (ref 22–32)
CO2: 22 mmol/L (ref 22–32)
Calcium: 8.9 mg/dL (ref 8.9–10.3)
Calcium: 9 mg/dL (ref 8.9–10.3)
Calcium: 9.1 mg/dL (ref 8.9–10.3)
Calcium: 9.1 mg/dL (ref 8.9–10.3)
Calcium: 8.8 mg/dL — ABNORMAL LOW (ref 8.9–10.3)
Chloride: 104 mmol/L (ref 98–111)
Chloride: 106 mmol/L (ref 98–111)
Chloride: 107 mmol/L (ref 98–111)
Chloride: 101 mmol/L (ref 98–111)
Chloride: 106 mmol/L (ref 98–111)
Creatinine, Ser: 1.21 mg/dL — ABNORMAL HIGH (ref 0.44–1.00)
Creatinine, Ser: 0.97 mg/dL (ref 0.44–1.00)
Creatinine, Ser: 1.14 mg/dL — ABNORMAL HIGH (ref 0.44–1.00)
Creatinine, Ser: 1.31 mg/dL — ABNORMAL HIGH (ref 0.44–1.00)
Creatinine, Ser: 1.02 mg/dL — ABNORMAL HIGH (ref 0.44–1.00)
GFR calc Af Amer: >60 mL/min (ref >60)
GFR calc Af Amer: >60 mL/min (ref >60)
GFR calc Af Amer: >60 mL/min (ref >60)
GFR calc Af Amer: >60 mL/min (ref >60)
GFR calc Af Amer: >60 mL/min (ref >60)
GFR calc non Af Amer: >60 mL/min (ref >60)
GFR calc non Af Amer: >60 mL/min (ref >60)
GFR calc non Af Amer: >60 mL/min (ref >60)
GFR calc non Af Amer: 56 mL/min — ABNORMAL LOW (ref >60)
GFR calc non Af Amer: >60 mL/min (ref >60)
Glucose, Bld: 302 mg/dL — ABNORMAL HIGH (ref 70–99)
Glucose, Bld: 213 mg/dL — ABNORMAL HIGH (ref 70–99)
Glucose, Bld: 173 mg/dL — ABNORMAL HIGH (ref 70–99)
Glucose, Bld: 475 mg/dL — ABNORMAL HIGH (ref 70–99)
Glucose, Bld: 326 mg/dL — ABNORMAL HIGH (ref 70–99)
Potassium: 3.8 mmol/L (ref 3.5–5.1)
Potassium: 3.5 mmol/L (ref 3.5–5.1)
Potassium: 3.6 mmol/L (ref 3.5–5.1)
Potassium: 4.4 mmol/L (ref 3.5–5.1)
Potassium: 3.7 mmol/L (ref 3.5–5.1)
Sodium: 138 mmol/L (ref 135–145)
Sodium: 139 mmol/L (ref 135–145)
Sodium: 140 mmol/L (ref 135–145)
Sodium: 134 mmol/L — ABNORMAL LOW (ref 135–145)
Sodium: 135 mmol/L (ref 135–145)

## 2020-03-29 LAB — BETA-HYDROXYBUTYRIC ACID
Beta-Hydroxybutyric Acid: 1.62 mmol/L — ABNORMAL HIGH (ref 0.05–0.27)
Beta-Hydroxybutyric Acid: 0.48 mmol/L — ABNORMAL HIGH (ref 0.05–0.27)

## 2020-03-29 MED ORDER — INSULIN STARTER KIT- PEN NEEDLES (ENGLISH)
1.0000 | Freq: Once | Status: DC
  Filled 2020-03-29: qty 1

## 2020-03-29 MED ORDER — FLUCONAZOLE 100 MG PO TABS
100.0000 mg | ORAL_TABLET | Freq: Every day | ORAL | Status: AC
  Administered 2020-03-29 – 2020-04-04 (×7): 100 mg via ORAL
  Filled 2020-03-29 (×7): qty 1

## 2020-03-29 MED ORDER — INSULIN GLARGINE 100 UNIT/ML ~~LOC~~ SOLN
25.0000 [IU] | Freq: Every day | SUBCUTANEOUS | Status: DC
  Administered 2020-03-29 – 2020-03-30 (×2): 25 [IU] via SUBCUTANEOUS
  Filled 2020-03-29 (×3): qty 0.25

## 2020-03-29 MED ORDER — INSULIN ASPART 100 UNIT/ML ~~LOC~~ SOLN
5.0000 [IU] | Freq: Three times a day (TID) | SUBCUTANEOUS | Status: DC
  Administered 2020-03-30 – 2020-03-31 (×5): 5 [IU] via SUBCUTANEOUS
  Filled 2020-03-29 (×5): qty 1

## 2020-03-29 MED ORDER — SODIUM CHLORIDE 0.9 % IV BOLUS
1000.0000 mL | Freq: Once | INTRAVENOUS | Status: AC
  Administered 2020-03-29: 1000 mL via INTRAVENOUS

## 2020-03-29 MED ORDER — INSULIN ASPART 100 UNIT/ML ~~LOC~~ SOLN
15.0000 [IU] | Freq: Once | SUBCUTANEOUS | Status: AC
  Administered 2020-03-29: 15 [IU] via SUBCUTANEOUS
  Filled 2020-03-29: qty 1

## 2020-03-29 MED ORDER — INSULIN ASPART 100 UNIT/ML ~~LOC~~ SOLN
20.0000 [IU] | Freq: Once | SUBCUTANEOUS | Status: AC
  Administered 2020-03-29: 20 [IU] via SUBCUTANEOUS
  Filled 2020-03-29: qty 1

## 2020-03-29 MED ORDER — ENOXAPARIN SODIUM 40 MG/0.4ML ~~LOC~~ SOLN
40.0000 mg | Freq: Two times a day (BID) | SUBCUTANEOUS | Status: DC
  Administered 2020-03-29 – 2020-04-03 (×8): 40 mg via SUBCUTANEOUS
  Filled 2020-03-29 (×11): qty 0.4

## 2020-03-29 MED ORDER — PROMETHAZINE HCL 25 MG/ML IJ SOLN
INTRAMUSCULAR | Status: AC
  Administered 2020-03-29: 12.5 mg via INTRAVENOUS
  Filled 2020-03-29: qty 1

## 2020-03-29 MED ORDER — PROMETHAZINE HCL 25 MG/ML IJ SOLN: 12.5000 mg | Freq: Four times a day (QID) | INTRAMUSCULAR | Status: DC | PRN

## 2020-03-29 MED ORDER — INSULIN ASPART 100 UNIT/ML ~~LOC~~ SOLN: 0.0000 [IU] | SUBCUTANEOUS | Status: DC

## 2020-03-29 MED ORDER — INSULIN ASPART 100 UNIT/ML ~~LOC~~ SOLN
0.0000 [IU] | SUBCUTANEOUS | Status: DC
  Administered 2020-03-29: 5 [IU] via SUBCUTANEOUS
  Administered 2020-03-29: 15 [IU] via SUBCUTANEOUS
  Administered 2020-03-30: 5 [IU] via SUBCUTANEOUS
  Administered 2020-03-30: 11 [IU] via SUBCUTANEOUS
  Administered 2020-03-30: 8 [IU] via SUBCUTANEOUS
  Administered 2020-03-30: 5 [IU] via SUBCUTANEOUS
  Filled 2020-03-29 (×8): qty 1

## 2020-03-29 MED ORDER — LIVING WELL WITH DIABETES BOOK
Freq: Once | Status: DC
  Filled 2020-03-29: qty 1

## 2020-03-29 NOTE — ED Notes (Signed)
Per Unit, RN has no questions, heads up given.

## 2020-03-29 NOTE — ED Notes (Signed)
Attempted to call report at this time, RN not ready. Will call back.

## 2020-03-29 NOTE — Progress Notes (Signed)
Inpatient Diabetes Program Recommendations  AACE/ADA: New Consensus Statement on Inpatient Glycemic Control (2015)  Target Ranges:  Prepandial:   less than 140 mg/dL      Peak postprandial:   less than 180 mg/dL (1-2 hours)      Critically ill patients:  140 - 180 mg/dL   Results for Cindy Leonard, Cindy Leonard (MRN [id number]) as of 03/29/2020 07:18  Ref. Range 03/28/2020 23:36 03/29/2020 00:54 03/29/2020 02:36 03/29/2020 04:03 03/29/2020 05:14 03/29/2020 06:09 03/29/2020 07:03  Glucose-Capillary Latest Ref Range: 70 - 99 mg/dL 409 (H) 811 (H) 914 (H) 197 (H) 259 (H) 196 (H) 164 (H)    Admit with: DKA/ Patient not taking meds for 2 weeks/ Found covered in urine at home and admitted to suicidal thoughts to ED MD  History: DM, Bipolar disorder, Schizophrenia  Home DM Meds: Metformin 500 mg Daily  Current Orders: IV Insulin Drip     Note 4am BMET shows the following: Glucose 213 mg/dl Anion Gap= 10 CO2 level= 23    MD- If you allow pt to transition to SQ Insulin this AM, please consider the following:  1. Start Lantus 25 units Daily (~0.2 units/kg)--Make sure pt receives 1st dose Lantus at least 1 hour prior to d/c of the IV Insulin Drip  2. Start Novolog Moderate Correction Scale/ SSI (0-15 units) TID AC + HS  Awaiting current A1c results which may help better determine discharge medication plan     --Will follow patient during hospitalization--  Ambrose Finland RN, MSN, CDE Diabetes Coordinator Inpatient Glycemic Control Team Team Pager: [phone] (8a-5p)

## 2020-03-29 NOTE — Progress Notes (Signed)
PROGRESS NOTE    Cindy Leonard  YIA:[id number] DOB: 1996-02-10 DOA: 03/28/2020 PCP: Marcine Matar, MD   Chief Complaint  Patient presents with  . Hyperglycemia  . Depression    Brief Narrative:  24 year old morbidly obese female with history of diabetes mellitus, bipolar disorder, schizophrenia who presented with generalized weakness, polydipsia, polyuria for almost 2 weeks.  For the past 3 days she reported being extremely weak and lying in the floor at home and urinating on herself.  Patient has not been compliant with her prescribed medication as it does not make her feel well.  Patient and her mother are unaware of prior history of diabetes. In the ED she was found to be in DKA with blood glucose of 900, sodium 127, K of 5.7, creatinine 1.65, WBC of 14 and anion gap of 24. Patient admitted for DKA.  Given aggressive IV hydration and started on insulin drip. In the ED she also expressed feeling very depressed and being suicidal.  Psychiatry consulted.   Assessment & Plan:   Principal Problem:   DKA (diabetic ketoacidoses) and type 2 diabetes mellitus (HCC) DKA currently resolved with aggressive IV hydration and insulin drip.  Patient still feels dehydrated and will continue maintenance normal saline at 125 cc/h.  Started on subcu Lantus with sliding scale coverage. Check A1c.  Will likely need insulin upon discharge.  Patient and mother willing to be on insulin.  Holding Metformin.  Patient and mother report that they are unaware of being diagnosis of diabetes.  As per prior labs her last A1c was 7.7, 1 year ago. She was seen in the community wellness clinic on 09/2018 and her diabetes was addressed. She is also on metformin for the same. Her DKA is secondary to dietary non adherence and medication non compliance.   Active Problems: Oral thrush Added fluconazole.    Hx of bipolar disorder and schizophrenia with?  Suicidal ideation Psych consult following.  Will determine  need for inpatient psych once medically cleared.  Cannot leave AMA until cleared by psych that patient is not suicidal.  Morbid obesity Counseled on diet and exercise to lose weight and significant lifestyle modification.  Acute kidney injury Prerenal secondary to dehydration.  Monitor with fluids.  DVT prophylaxis: Subcu Lovenox Code Status: Full code Family Communication: Mother at bedside Disposition:   Status is: Inpatient  Remains inpatient appropriate because:IV treatments appropriate due to intensity of illness or inability to take PO   Dispo: The patient is from: Home              Anticipated d/c is to: Home              Anticipated d/c date is: 1 [place]              Patient currently is not medically stable to d/c.        Consultants:   Psychiatry   Procedures: None   Antimicrobials: None   Subjective: Seen and examined.  Reports feeling tired.  Also reports difficulty swallowing with pain.  Denies nausea, vomiting, abdominal pain or dysuria. Anion gap closed overnight.  Objective: Vitals:   03/29/20 1130 03/29/20 1200 03/29/20 1230 03/29/20 1330  BP: 123/74 131/82 120/76   Pulse: (!) 105 100 94 (!) 103  Resp:      Temp:      TempSrc:      SpO2: 95% (!) 82% 96% 96%  Weight:      Height:  Intake/Output Summary (Last 24 hours) at 03/29/2020 1432 Last data filed at 03/29/2020 1128 Gross per 24 hour  Intake 801.21 ml  Output --  Net 801.21 ml   Filed Weights   03/28/20 1048  Weight: 136.1 kg    Examination:  General: Middle-aged morbidly obese female not in distress, fatigue HEENT: Dry mucosa, oral thrush +, supple neck Chest: Clear bilaterally CVs: Normal S1-S2 GI: Soft, nontender, nondistended Musculoskeletal: Warm, no edema    Data Reviewed: I have personally reviewed following labs and imaging studies  CBC: Recent Labs  Lab 03/28/20 1059  WBC 14.7*  HGB 14.8  HCT 48.3*  MCV 86.1  PLT 671    Basic Metabolic  Panel: Recent Labs  Lab 03/28/20 1059 03/28/20 1059 03/28/20 1545 03/28/20 1926 03/28/20 2329 03/29/20 0402 03/29/20 0704  NA 127*   < > 134* 138 138 139 140  K 5.7*   < > 4.1 4.0 3.8 3.5 3.6  CL 89*   < > 99 105 104 106 107  CO2 14*   < > 16* 17* 23 23 24   GLUCOSE 979*   < > 607* 362* 302* 213* 173*  BUN 28*   < > 27* 24* 22* 22* 21*  CREATININE 1.65*   < > 1.47* 1.42* 1.21* 0.97 1.14*  CALCIUM 9.3   < > 8.9 9.1 8.9 9.0 9.1  MG 2.5*  --   --   --   --   --   --    < > = values in this interval not displayed.    GFR: Estimated Creatinine Clearance: 108.8 mL/min (A) (by C-G formula based on SCr of 1.14 mg/dL (H)).  Liver Function Tests: Recent Labs  Lab 03/28/20 1059  AST 29  ALT 26  ALKPHOS 104  BILITOT 2.0*  PROT 8.6*  ALBUMIN 3.9    CBG: Recent Labs  Lab 03/29/20 0609 03/29/20 0703 03/29/20 0806 03/29/20 0910 03/29/20 1002  GLUCAP 196* 164* 190* 173* 219*     Recent Results (from the past 240 hour(s))  SARS Coronavirus 2 by RT PCR (hospital order, performed in [facility] hospital lab) Nasopharyngeal Nasopharyngeal Swab     Status: None   Collection Time: 03/28/20  9:56 AM   Specimen: Nasopharyngeal Swab  Result Value Ref Range Status   SARS Coronavirus 2 NEGATIVE NEGATIVE Final    Comment: (NOTE) SARS-CoV-2 target nucleic acids are NOT DETECTED. The SARS-CoV-2 RNA is generally detectable in upper and lower respiratory specimens during the acute phase of infection. The lowest concentration of SARS-CoV-2 viral copies this assay can detect is 250 copies / mL. A negative result does not preclude SARS-CoV-2 infection and should not be used as the sole basis for treatment or other patient management decisions.  A negative result may occur with improper specimen collection / handling, submission of specimen other than nasopharyngeal swab, presence of viral mutation(s) within the areas targeted by this assay, and inadequate number of viral copies (<250  copies / mL). A negative result must be combined with clinical observations, patient history, and epidemiological information. Fact Sheet for Patients:   StrictlyIdeas.no Fact Sheet for Healthcare Providers: BankingDealers.co.za This test is not yet approved or cleared  by the [place] FDA and has been authorized for detection and/or diagnosis of SARS-CoV-2 by FDA under an Emergency Use Authorization (EUA).  This EUA will remain in effect (meaning this test can be used) for the duration of the COVID-19 declaration under Section 564(b)(1) of the Act, 21 U.S.C.  section 360bbb-3(b)(1), unless the authorization is terminated or revoked sooner. Performed at [facility], [address], [place], [place] [address]          Radiology Studies: No results found.      Scheduled Meds: . enoxaparin (LOVENOX) injection  40 mg Subcutaneous Q12H  . fluconazole  100 mg Oral Daily  . insulin aspart  0-15 Units Subcutaneous Q4H  . insulin glargine  25 Units Subcutaneous Daily  . lamoTRIgine  200 mg Oral Daily  . lurasidone  40 mg Oral Q breakfast  . melatonin  5 mg Oral QHS   Continuous Infusions: . sodium chloride 125 mL/hr at 03/29/20 1158  . sodium chloride    . dextrose 5 % and 0.45% NaCl Stopped (03/28/20 1521)  . dextrose 5 % and 0.45% NaCl Stopped (03/29/20 1128)     LOS: 1 [place]    Time spent: 35 minutes    Broady Lafoy, MD [facility]   To contact the attending provider between 7A-7P or the covering provider during after hours 7P-7A, please log into the web site [email or web address] and access using universal [place] password for that web site. If you do not have the password, please call the hospital operator.  03/29/2020, 2:32 PM

## 2020-03-29 NOTE — ED Notes (Signed)
Pt urinated in bed, pt cleaned of urine. New sheets placed. Pt brushed teeth. Mom given toothbrush.

## 2020-03-29 NOTE — ED Notes (Signed)
Pt asleep- arouses to touch

## 2020-03-29 NOTE — ED Notes (Signed)
Pt vomiting and reporting the GI cocktail made her stomach hurt worse. Pt repeatedly stating how nasty it tasted and is spitting on the floor.

## 2020-03-29 NOTE — Plan of Care (Signed)
  RD consulted for nutrition education regarding diabetes.   Lab Results  Component Value Date   HGBA1C 7.7 (A) 09/12/2018   Met with patient and her mother at bedside. Patient was awake and actively participated in education. She reports that this is not a new diagnosis of DM. She reports she was aware she had DM but has never received formal education from a RD. She reports she typically snacks throughout the [place] and may have lighter foods. She has sandwiches, salads, pasta, yogurt, and fruits/vegetables. Patient typically drinks Ginger-ale, fruit punch, or water during the [place].  RD provided "Carbohydrate Counting for People with Diabetes" handout from the Academy of Nutrition and Dietetics. Discussed different food groups and their effects on blood sugar, emphasizing carbohydrate-containing foods. Provided list of carbohydrates and recommended serving sizes of common foods.  Discussed importance of controlled and consistent carbohydrate intake throughout the [place]. Provided examples of ways to balance meals/snacks and encouraged intake of high-fiber, whole grain complex carbohydrates. Teach back method used.  Expect good compliance.  Body mass index is 46.99 kg/m. Pt meets criteria for obesity class III based on current BMI.  Current diet order is carbohydrate modified. Patient was just admitted to the floor and has not yet received a meal tray. Called and requested menu for patient and discussed options available on the menu with her. Labs and medications reviewed. No further nutrition interventions warranted at this time. RD contact information provided. If additional nutrition issues arise, please re-consult RD.  Jacklynn Barnacle, MS, RD, LDN Pager number available on Amion

## 2020-03-29 NOTE — Discharge Instructions (Signed)
Website:  American Diabetes Teacher, adult education.diabetes.org  Insulin Pen Video: [email or web address]   Insulin Pen Instructions:  1. Remove Insulin pen cap and clean pen 1st with alcohol rub and then clean skin 2nd with alcohol rub 2. Twist insulin pen needle onto pen (right tighty) 3. Remove outer cap and inner cap from needle 4. Dial pen to 2 units and perform prime- press pen to zero and make sure liquid (insulin) comes out of the needle 5. Dial pen to your dose and perform injection into your abdomen 6. Hold needle in skin for 10 seconds after injection 7. Remove needle from insulin pen and discard 8. Place cap back on insulin pen and store safely (at room temperature) 9. Store unused pens in refrigerator and can keep opened insulin pen at room temperature (discard used pen after 30 days)  Symptoms of Hypoglycemia: Silly, Sweaty, Shaky Check sugar if you have your meter.  If near or less than 70 mg/dl, treat with 1/2 cup juice or soda or take glucose tablets Check sugar 15 minutes after treatment.  If sugar still near or less than 70 mg/al and symptomatic, treat again and may need a snack with some protein (peanut butter with crackers, etc)

## 2020-03-29 NOTE — Progress Notes (Signed)
Admit with: DKA/ Patient not taking meds for 2 weeks/ Found covered in urine at home and admitted to suicidal thoughts to ED MD  History: DM, Bipolar disorder, Schizophrenia  Home DM Meds: Metformin 500 mg Daily    Went down to the ED to speak w/ pt.  Mom at bedside.  Patient was sleeping soundly and would not open her eyes or wake up to talk w/ me.  She was arousable to touch but would not make any effort to communicate with me.  Mom told me the RN had given her a cup full of meds and wondered if the meds were making her sleepy.  Attempted twice to wake pt without success.  Spoke with Mom.  Mom told me pt stopped taking all her meds about 2 weeks ago.  Mom told me pt lives in a home but is independent and has disability and Medicaid.  Not sure if this is a group home??--Mom unclear with living situation.  Mom told me pt does not share much with her and she is not sure if she has a diagnosis of diabetes.  Mom did not know pt stopped her meds 2 weeks ago.  Mom also told me pt is to be re-evaluated by the Psych team to see if she needs inpatient Psych hospitalization as well.  Spoke with Mom about new diagnosis.  Explained what an A1C is, basic pathophysiology of DM Type 2, basic home care, basic diabetes diet nutrition principles, importance of checking CBGs and maintaining good CBG control to prevent long-term and short-term complications.  Reviewed signs and symptoms of hyperglycemia and hypoglycemia and how to treat hypoglycemia at home.  Also reviewed blood sugar goals and A1c goals for home.  Discussed w/ mom that pt has several risk factors for Type 2 diabetes including her weight, family history of DM, Psychiatric meds.  Gave Mom 2 educational pamphlets on diabetes basics.  Discussed with Mom diagnosis of DKA (pathophysiology), treatment of DKA, lab results, and transition plan to SQ insulin regimen.  Explained to Mom that we have begun the transition to SQ Insulin this AM.  Discussed w/ Mom  that we will assess pt's response to the insulin we give her and we will also use her A1c to help determine the best medication regimen to prescribe at time of d/c.  Unsure if pt will require insulin at time of d/c?  Have asked Mom to please be a part of learning diabetes with pt as much as pt will allow.  Mom stated she would be willing to help pt takes meds, insulin, help with nutrition, etc.  RNs to provide ongoing basic DM education at bedside with this patient.  Have ordered educational booklet, insulin starter kit, and DM videos.  Have also placed RD consult for DM diet education for this patient.     --Will follow patient during hospitalization--  Wyn Quaker RN, MSN, CDE Diabetes Coordinator Inpatient Glycemic Control Team Team Pager: [phone] (8a-5p)

## 2020-03-29 NOTE — ED Notes (Signed)
Transportation requested- ED tech to take upstairs

## 2020-03-29 NOTE — ED Notes (Signed)
Diabetes educator at bedside.

## 2020-03-29 NOTE — ED Notes (Signed)
Assisted patient to bathroom patent had bowel movement. Patient ambulated with assistance.AS

## 2020-03-29 NOTE — ED Notes (Signed)
Pt assisted to bedside commode with walker.  

## 2020-03-29 NOTE — ED Notes (Signed)
MD notified time insulin drip was discontinued and subq insulin initiated, and BGL of 284 at 1240. Per MD Nishant plan is to continue subq insulin.

## 2020-03-29 NOTE — ED Notes (Signed)
Pt taken to floor with cardiac monitoring by this RN

## 2020-03-30 DIAGNOSIS — K859 Acute pancreatitis without necrosis or infection, unspecified: Secondary | ICD-10-CM

## 2020-03-30 DIAGNOSIS — L709 Acne, unspecified: Secondary | ICD-10-CM

## 2020-03-30 LAB — BASIC METABOLIC PANEL
Anion gap: 9 (ref 5–15)
BUN: 18 mg/dL (ref 6–20)
CO2: 23 mmol/L (ref 22–32)
Calcium: 8.5 mg/dL — ABNORMAL LOW (ref 8.9–10.3)
Chloride: 107 mmol/L (ref 98–111)
Creatinine, Ser: 0.88 mg/dL (ref 0.44–1.00)
GFR calc Af Amer: >60 mL/min (ref >60)
GFR calc non Af Amer: >60 mL/min (ref >60)
Glucose, Bld: 262 mg/dL — ABNORMAL HIGH (ref 70–99)
Potassium: 3.8 mmol/L (ref 3.5–5.1)
Sodium: 139 mmol/L (ref 135–145)

## 2020-03-30 LAB — GLUCOSE, CAPILLARY
Glucose-Capillary: 235 mg/dL — ABNORMAL HIGH (ref 70–99)
Glucose-Capillary: 284 mg/dL — ABNORMAL HIGH (ref 70–99)
Glucose-Capillary: 242 mg/dL — ABNORMAL HIGH (ref 70–99)
Glucose-Capillary: 218 mg/dL — ABNORMAL HIGH (ref 70–99)
Glucose-Capillary: 304 mg/dL — ABNORMAL HIGH (ref 70–99)
Glucose-Capillary: 369 mg/dL — ABNORMAL HIGH (ref 70–99)
Glucose-Capillary: 255 mg/dL — ABNORMAL HIGH (ref 70–99)

## 2020-03-30 LAB — CBC
HCT: 40.3 % (ref 36.0–46.0)
Hemoglobin: 12.5 g/dL (ref 12.0–15.0)
MCH: 26.2 pg (ref 26.0–34.0)
MCHC: 31 g/dL (ref 30.0–36.0)
MCV: 84.5 fL (ref 80.0–100.0)
Platelets: 257 10*3/uL (ref 150–400)
RBC: 4.77 MIL/uL (ref 3.87–5.11)
RDW: 15.4 % (ref 11.5–15.5)
WBC: 10.1 10*3/uL (ref 4.0–10.5)
nRBC: 0 % (ref 0.0–0.2)

## 2020-03-30 MED ORDER — INSULIN ASPART 100 UNIT/ML ~~LOC~~ SOLN
0.0000 [IU] | Freq: Three times a day (TID) | SUBCUTANEOUS | Status: DC
  Administered 2020-03-30: 15 [IU] via SUBCUTANEOUS
  Administered 2020-03-31: 8 [IU] via SUBCUTANEOUS
  Administered 2020-03-31: 5 [IU] via SUBCUTANEOUS
  Administered 2020-03-31: 8 [IU] via SUBCUTANEOUS
  Administered 2020-04-01: 5 [IU] via SUBCUTANEOUS
  Administered 2020-04-01: 8 [IU] via SUBCUTANEOUS
  Administered 2020-04-01: 3 [IU] via SUBCUTANEOUS
  Administered 2020-04-02 (×3): 5 [IU] via SUBCUTANEOUS
  Administered 2020-04-03 (×2): 3 [IU] via SUBCUTANEOUS
  Administered 2020-04-03 – 2020-04-04 (×2): 8 [IU] via SUBCUTANEOUS
  Filled 2020-03-30 (×14): qty 1

## 2020-03-30 MED ORDER — NYSTATIN 100000 UNIT/GM EX CREA
TOPICAL_CREAM | Freq: Two times a day (BID) | CUTANEOUS | Status: DC
  Filled 2020-03-30 (×2): qty 15

## 2020-03-30 MED ORDER — FLUOXETINE HCL 10 MG PO CAPS
10.0000 mg | ORAL_CAPSULE | Freq: Every day | ORAL | Status: DC
  Administered 2020-03-30 – 2020-04-03 (×5): 10 mg via ORAL
  Filled 2020-03-30 (×5): qty 1

## 2020-03-30 MED ORDER — INSULIN ASPART 100 UNIT/ML ~~LOC~~ SOLN
0.0000 [IU] | Freq: Every day | SUBCUTANEOUS | Status: DC
  Administered 2020-03-30: 3 [IU] via SUBCUTANEOUS
  Administered 2020-03-31 – 2020-04-01 (×2): 2 [IU] via SUBCUTANEOUS
  Filled 2020-03-30 (×3): qty 1

## 2020-03-30 MED ORDER — ALUM & MAG HYDROXIDE-SIMETH 200-200-20 MG/5ML PO SUSP
30.0000 mL | ORAL | Status: DC | PRN
  Administered 2020-03-30: 30 mL via ORAL
  Filled 2020-03-30: qty 30

## 2020-03-30 MED ORDER — DOCUSATE SODIUM 100 MG PO CAPS
100.0000 mg | ORAL_CAPSULE | Freq: Two times a day (BID) | ORAL | Status: DC
  Administered 2020-03-30 – 2020-04-04 (×11): 100 mg via ORAL
  Filled 2020-03-30 (×11): qty 1

## 2020-03-30 MED ORDER — INSULIN GLARGINE 100 UNIT/ML ~~LOC~~ SOLN
32.0000 [IU] | Freq: Every day | SUBCUTANEOUS | Status: DC
  Filled 2020-03-30: qty 0.32

## 2020-03-30 NOTE — TOC Progression Note (Signed)
Transition of Care [facility]) - Progression Note    Patient Details  Name: Cindy Leonard MRN: [id number] Date of Birth: 1996-07-07  [facility] CM/SW Contact  Eilleen Kempf, LCSW Phone Number: 03/30/2020, 2:42 PM  Clinical Narrative:    5/29: CSW called Patients mother, Debbe Odea to confirm patient is able to live with her at discharge. When Patient is ready to discharge, contact her mother, Debbe Odea         Expected Discharge Plan and Services                                                 Social Determinants of Health (SDOH) Interventions    Readmission Risk Interventions No flowsheet data found.

## 2020-03-30 NOTE — Progress Notes (Signed)
Inpatient Diabetes Program Recommendations  AACE/ADA: New Consensus Statement on Inpatient Glycemic Control (2015)  Target Ranges:  Prepandial:   less than 140 mg/dL      Peak postprandial:   less than 180 mg/dL (1-2 hours)      Critically ill patients:  140 - 180 mg/dL   Lab Results  Component Value Date   GLUCAP 218 (H) 03/30/2020   HGBA1C 7.7 (A) 09/12/2018    Review of Glycemic Control  Blood sugars > goal of 140-180 mg/dL. Needs insulin titration.  Inpatient Diabetes Program Recommendations:     Increase Lantus to 30 units QAM Increase Novolog to 8 units tidwc for meal coverage insulin if eating > 50% meal Change Novolog correction 0-15 units to tidwc and hs. Please order HgbA1C. Last one 09/12/18.  Continue to follow glucose trends.  Thank you. Ailene Ards, RD, LDN, CDE Inpatient Diabetes Coordinator [phone]

## 2020-03-30 NOTE — Progress Notes (Addendum)
PROGRESS NOTE  Cindy Leonard DOB: 10-21-1996 DOA: 03/28/2020 PCP: Ladell Pier, MD  Brief History   24 year old morbidly obese female with history of diabetes mellitus, bipolar disorder, schizophrenia who presented with generalized weakness, polydipsia, polyuria for almost 2 weeks.  For the past 3 days she reported being extremely weak and lying in the floor at home and urinating on herself.  Patient has not been compliant with her prescribed medication as it does not make her feel well.  Patient and her mother are unaware of prior history of diabetes. In the ED she was found to be in DKA with blood glucose of 900, sodium 127, K of 5.7, creatinine 1.65, WBC of 14 and anion gap of 24. Patient admitted for DKA.  Given aggressive IV hydration and started on insulin drip. In the ED she also expressed feeling very depressed and being suicidal.  Psychiatry consulted.  DKA is resolved. Psychiatry has been re-consulted as suggested now that she is medically clear for discharge. They have recommended inpatient psychiatry treatment and restarted the patient's home medications.   Consultants  . Psychiatry  Procedures  . None  Antibiotics   Anti-infectives (From admission, onward)   Start     Dose/Rate Route Frequency Ordered Stop   03/29/20 1000  fluconazole (DIFLUCAN) tablet 100 mg     100 mg Oral Daily 03/29/20 0831      .  Subjective  The patient is sleeping deeply, and not awakened. Sitter states that the patient had been awake earlier today.  Objective   Vitals:  Vitals:   03/30/20 0700 03/30/20 1127  BP: 119/78 (!) 144/98  Pulse: 78 78  Resp: 19 17  Temp: 97.9 F (36.6 C) 98.6 F (37 C)  SpO2: 100% 99%   Exam:  Constitutional:  . The patient is sleeping soundly. No acute distress. Respiratory:  . No increased work of breathing. . No wheezes, rales, or rhonchi . No tactile fremitus Cardiovascular:  . Regular rate and rhythm . No murmurs, ectopy, or  gallups. . No lateral PMI. No thrills. Abdomen:  . Abdomen is soft, non-tender, non-distended . No hernias, masses, or organomegaly . Normoactive bowel sounds.  Musculoskeletal:  . No cyanosis, clubbing, or edema Skin:  . No rashes, lesions, ulcers . palpation of skin: no induration or nodules Neurologic:  Unable to evaluate due to patient's inability to cooperate with exam. Psychiatric:  . Unable to evaluate due to patient's inability to cooperate with exam.   I have personally reviewed the following:   Today's Data  . Vitals, BMP, CBC  Scheduled Meds: . docusate sodium  100 mg Oral BID  . enoxaparin (LOVENOX) injection  40 mg Subcutaneous Q12H  . fluconazole  100 mg Oral Daily  . FLUoxetine  10 mg Oral QHS  . insulin aspart  0-15 Units Subcutaneous Q4H  . insulin aspart  5 Units Subcutaneous TID WC  . insulin glargine  25 Units Subcutaneous Daily  . insulin starter kit- pen needles  1 kit Other Once  . lamoTRIgine  200 mg Oral Daily  . living well with diabetes book   Does not apply Once  . lurasidone  40 mg Oral Q breakfast  . melatonin  5 mg Oral QHS   Continuous Infusions: . sodium chloride 125 mL/hr at 03/30/20 1015    Principal Problem:   DKA (diabetic ketoacidoses) ([place]) Active Problems:   Hx of bipolar disorder   Schizophrenia (HCC)   Acne   Depression  Hyponatremia   LOS: 2 days   A & P   DKA (diabetic ketoacidoses) and type 2 diabetes mellitus ([place]):  DKA currently resolved with aggressive IV hydration and insulin drip.  Patient still feels dehydrated and will continue maintenance normal saline at 125 cc/h.  Started on subcu Lantus with sliding scale coverage.  Will likely need insulin upon discharge.  Patient and mother willing to be on insulin.  Holding Metformin.  Patient and mother report that they are unaware of being diagnosis of diabetes.  As per prior labs her last A1c was 7.7, 1 year ago. She was seen in the community wellness clinic on  09/2018 and her diabetes was addressed. She is also on metformin for the same. Her DKA is secondary to dietary non adherence and medication non compliance.   DM II: Uncontrolled. The patient is receiving Lantus 25 units daily as well as SSI guided by FSBS qac and hs with 5 units of novolog with meals. FSBS over the past 24 hours has run from 214 to 443.   Oral thrush/Vulvovaginal Candidiasis: The patient is receiving fluconazole and nystatin cream.   Hx of bipolar disorder and schizophrenia with?  Suicidal ideation. The patient has been  Psych consult following.  Psychiatry has re-evaluated her. She will be sent to inpatient psychiatry.  Morbid obesity: Noted. Counseled on diet and exercise to lose weight and significant lifestyle modification.  Acute kidney injury: Resolved. Monitor.  I have seen and examined this patient myself. I have spent 34 minutes in her evaluation and care.  DVT prophylaxis: Subcu Lovenox Code Status: Full code Family Communication: Mother at bedside Disposition:   Status is: Inpatient  Remains inpatient appropriate because:IV treatments appropriate due to intensity of illness or inability to take PO   Dispo: The patient is from: Home  Anticipated d/c is to: Inpatient psychiatry.  Anticipated d/c date is: 1 [place]  Patient currently is not medically stable to d/c. Barriers to discharge: uncontrolled glucoses and  bed in inpatient psychiatric facility.  Amery Vandenbos, DO [facility] Direct contact: see [email or web address]  7PM-7AM contact night coverage as above 03/30/2020, 3:21 PM  LOS: 2 days

## 2020-03-30 NOTE — Consult Note (Signed)
[facility] Face-to-Face Psychiatry Consult   Reason for Consult: Hyperglycemia and Depression Referring Physician:  Dr. Joni Fears Patient Identification: Cindy Leonard MRN:  [id number] Principal Diagnosis: DKA (diabetic ketoacidoses) ([place]) Diagnosis:  Principal Problem:   DKA (diabetic ketoacidoses) ([place]) Active Problems:   Hx of bipolar disorder   Schizophrenia ([place])   Acne   Depression   Hyponatremia   Total Time spent with patient: 30 minutes  Subjective:   Cindy Leonard is a 24 y.o. female patient presented to [facility] ED via EMS with history of diabetes mellitus type 2, bipolar disorder, schizophrenia who presented with generalized weakness, polydipsia, polyuria for 2 weeks. Patient states she has depression that has worsened as a result of her diabetes. " I been depressed for a week. I was sad. I did try to kill myself in the tub with cold water. I just let it run over me. I also popped a pill before I got in the tub. I got a pill from a friend I dont know what it was. " She reports worsening diabetes that has caused bladder loss. " I m not myself. Im not normal the diabetes. " She endorses chronic suicidal thoughts with no plan.    HPI: per Dr. Joni Fears: Cindy Leonard is a 24 y.o. female with a history of bipolar disorder, schizophrenia, type 2 diabetes who comes the ED complaining of generalized weakness and polyuria for the past 2 weeks, polydipsia.     Patient assessed and case evaluated. Patient at this time of the evaluation was observed lying in bed very dishevel and guarded at times. Her mood is dysphoric and depressive although her affect is not congruent as she is observed smiling inappropriately at times. There was also some cognitive and intellectual delay noted, and patient is noted to be a poor historian. She is under the care of [facility] for depression, bipolar and "schizo". She denies medication compliance due to her depression.  Physically she is able to ambulate with  assistance, however she is noted to have a pure wick catheter in place. Per mom she lives in an assisted living facility, and reportedly had a 504 plan while in school. There is no documentation of these findings in her chart. She reports that she continues to suffer from depression and suicidal thoughts, and is unable to contract for safety.  Patient with reported suicide attempt by taking an unknown pill and drowning, lacks self care (sat in urine for 3 days), neurovegetative depressive Symptoms, who lacks insight and poor judgement at this time. Will admit inpatient, upon resolution of symptoms patient will need evaluation to determine competency and assess needs of medical living facility.    Past Psychiatric History:  Bipolar 1 disorder ([place]) Depression Schizophrenia ([place])  Previous home medications included Lamictal, Latuda under the care of [facility]. Patient seeks care in the emergency room, and has only a few office visits.   Risk to Self:   Yes Risk to Others:   Yes Prior Inpatient Therapy:   No (per mom) Prior Outpatient Therapy:   yes  Past Medical History:  Past Medical History:  Diagnosis Date  . Bipolar 1 disorder ([place])   . Depression   . Schizophrenia ([place])    History reviewed. No pertinent surgical history. Family History:  Family History  Problem Relation Age of Onset  . Stroke Mother   . Diabetes Other   . Hypertension Other   . Asthma Father   . Asthma Brother    Family Psychiatric  History:  Social History:  Social History   Substance and Sexual Activity  Alcohol Use No     Social History   Substance and Sexual Activity  Drug Use No   Comment: clean of cocaine x 3 yrs    Social History   Socioeconomic History  . Marital status: Single    Spouse name: Not on file  . Number of children: Not on file  . Years of education: Not on file  . Highest education level: Not on file  Occupational History  . Not on file  Tobacco Use  . Smoking status: Never  Smoker  . Smokeless tobacco: Never Used  Substance and Sexual Activity  . Alcohol use: No  . Drug use: No    Comment: clean of cocaine x 3 yrs  . Sexual activity: Yes    Birth control/protection: None  Other Topics Concern  . Not on file  Social History Narrative  . Not on file   Social Determinants of Health   Financial Resource Strain:   . Difficulty of Paying Living Expenses:   Food Insecurity:   . Worried About Charity fundraiser in the Last Year:   . Arboriculturist in the Last Year:   Transportation Needs:   . Film/video editor (Medical):   Marland Kitchen Lack of Transportation (Non-Medical):   Physical Activity:   . Days of Exercise per Week:   . Minutes of Exercise per Session:   Stress:   . Feeling of Stress :   Social Connections:   . Frequency of Communication with Friends and Family:   . Frequency of Social Gatherings with Friends and Family:   . Attends Religious Services:   . Active Member of Clubs or Organizations:   . Attends Archivist Meetings:   Marland Kitchen Marital Status:    Additional Social History:    Allergies:   Allergies  Allergen Reactions  . Food Swelling    Eyes/throat swelling (blueberries)    Labs:  Results for orders placed or performed during the hospital encounter of 03/28/20 (from the past 48 hour(s))  Glucose, capillary     Status: Abnormal   Collection Time: 03/28/20  3:05 PM  Result Value Ref Range   Glucose-Capillary 528 (HH) 70 - 99 mg/dL    Comment: Glucose reference range applies only to samples taken after fasting for at least 8 hours.  Glucose, capillary     Status: Abnormal   Collection Time: 03/28/20  3:35 PM  Result Value Ref Range   Glucose-Capillary 540 (HH) 70 - 99 mg/dL    Comment: Glucose reference range applies only to samples taken after fasting for at least 8 hours.  HIV Antibody (routine testing w rflx)     Status: None   Collection Time: 03/28/20  3:45 PM  Result Value Ref Range   HIV Screen 4th Generation  wRfx Non Reactive Non Reactive    Comment: Performed at [facility], [place] [address] [place], [place] [address]  Basic metabolic panel     Status: Abnormal   Collection Time: 03/28/20  3:45 PM  Result Value Ref Range   Sodium 134 (L) 135 - 145 mmol/L   Potassium 4.1 3.5 - 5.1 mmol/L   Chloride 99 98 - 111 mmol/L   CO2 16 (L) 22 - 32 mmol/L   Glucose, Bld 607 (HH) 70 - 99 mg/dL    Comment: CRITICAL RESULT CALLED TO, READ BACK BY AND VERIFIED WITH MEGAN JONES @  1655 03/28/20 MJU Glucose reference range applies only to samples taken after fasting for at least 8 hours.    BUN 27 (H) 6 - 20 mg/dL   Creatinine, Ser 1.47 (H) 0.44 - 1.00 mg/dL   Calcium 8.9 8.9 - 10.3 mg/dL   GFR calc non Af Amer 49 (L) >60 mL/min   GFR calc Af Amer 57 (L) >60 mL/min   Anion gap 19 (H) 5 - 15    Comment: Performed at [facility], [address], [place], [place] [address]  Beta-hydroxybutyric acid     Status: Abnormal   Collection Time: 03/28/20  3:45 PM  Result Value Ref Range   Beta-Hydroxybutyric Acid 6.21 (H) 0.05 - 0.27 mmol/L    Comment: RESULT CONFIRMED BY MANUAL DILUTION MJU Performed at [facility], [address] [place], [place] [address]   Glucose, capillary     Status: Abnormal   Collection Time: 03/28/20  4:06 PM  Result Value Ref Range   Glucose-Capillary 557 (HH) 70 - 99 mg/dL    Comment: Glucose reference range applies only to samples taken after fasting for at least 8 hours.  Glucose, capillary     Status: Abnormal   Collection Time: 03/28/20  4:39 PM  Result Value Ref Range   Glucose-Capillary 495 (H) 70 - 99 mg/dL    Comment: Glucose reference range applies only to samples taken after fasting for at least 8 hours.  Glucose, capillary     Status: Abnormal   Collection Time: 03/28/20  5:09 PM  Result Value Ref Range   Glucose-Capillary 447 (H) 70 - 99 mg/dL    Comment: Glucose reference range applies only to samples taken after fasting for at least 8  hours.   Comment 1 Call MD NNP PA CNM   Glucose, capillary     Status: Abnormal   Collection Time: 03/28/20  5:42 PM  Result Value Ref Range   Glucose-Capillary 436 (H) 70 - 99 mg/dL    Comment: Glucose reference range applies only to samples taken after fasting for at least 8 hours.  Glucose, capillary     Status: Abnormal   Collection Time: 03/28/20  6:21 PM  Result Value Ref Range   Glucose-Capillary 366 (H) 70 - 99 mg/dL    Comment: Glucose reference range applies only to samples taken after fasting for at least 8 hours.  Basic metabolic panel     Status: Abnormal   Collection Time: 03/28/20  7:26 PM  Result Value Ref Range   Sodium 138 135 - 145 mmol/L   Potassium 4.0 3.5 - 5.1 mmol/L   Chloride 105 98 - 111 mmol/L   CO2 17 (L) 22 - 32 mmol/L   Glucose, Bld 362 (H) 70 - 99 mg/dL    Comment: Glucose reference range applies only to samples taken after fasting for at least 8 hours.   BUN 24 (H) 6 - 20 mg/dL   Creatinine, Ser 1.42 (H) 0.44 - 1.00 mg/dL   Calcium 9.1 8.9 - 10.3 mg/dL   GFR calc non Af Amer 51 (L) >60 mL/min   GFR calc Af Amer 59 (L) >60 mL/min   Anion gap 16 (H) 5 - 15    Comment: Performed at [facility], [address] [place], [place] [address]  Glucose, capillary     Status: Abnormal   Collection Time: 03/28/20  7:33 PM  Result Value Ref Range   Glucose-Capillary 356 (H) 70 - 99 mg/dL  Comment: Glucose reference range applies only to samples taken after fasting for at least 8 hours.  Glucose, capillary     Status: Abnormal   Collection Time: 03/28/20  9:04 PM  Result Value Ref Range   Glucose-Capillary 261 (H) 70 - 99 mg/dL    Comment: Glucose reference range applies only to samples taken after fasting for at least 8 hours.  POC urine preg, ED     Status: None   Collection Time: 03/28/20  9:09 PM  Result Value Ref Range   Preg Test, Ur Negative Negative  Glucose, capillary     Status: Abnormal   Collection Time: 03/28/20 10:33 PM  Result  Value Ref Range   Glucose-Capillary 248 (H) 70 - 99 mg/dL    Comment: Glucose reference range applies only to samples taken after fasting for at least 8 hours.  Basic metabolic panel     Status: Abnormal   Collection Time: 03/28/20 11:29 PM  Result Value Ref Range   Sodium 138 135 - 145 mmol/L   Potassium 3.8 3.5 - 5.1 mmol/L   Chloride 104 98 - 111 mmol/L   CO2 23 22 - 32 mmol/L   Glucose, Bld 302 (H) 70 - 99 mg/dL    Comment: Glucose reference range applies only to samples taken after fasting for at least 8 hours.   BUN 22 (H) 6 - 20 mg/dL   Creatinine, Ser 1.21 (H) 0.44 - 1.00 mg/dL   Calcium 8.9 8.9 - 10.3 mg/dL   GFR calc non Af Amer >60 >60 mL/min   GFR calc Af Amer >60 >60 mL/min   Anion gap 11 5 - 15    Comment: Performed at [facility], [address] [place], [place] [address]  Beta-hydroxybutyric acid     Status: Abnormal   Collection Time: 03/28/20 11:29 PM  Result Value Ref Range   Beta-Hydroxybutyric Acid 1.62 (H) 0.05 - 0.27 mmol/L    Comment: Performed at [facility], [address] [place], [place] [address]  Glucose, capillary     Status: Abnormal   Collection Time: 03/28/20 11:36 PM  Result Value Ref Range   Glucose-Capillary 303 (H) 70 - 99 mg/dL    Comment: Glucose reference range applies only to samples taken after fasting for at least 8 hours.  Glucose, capillary     Status: Abnormal   Collection Time: 03/29/20 12:54 AM  Result Value Ref Range   Glucose-Capillary 278 (H) 70 - 99 mg/dL    Comment: Glucose reference range applies only to samples taken after fasting for at least 8 hours.  Glucose, capillary     Status: Abnormal   Collection Time: 03/29/20  2:36 AM  Result Value Ref Range   Glucose-Capillary 264 (H) 70 - 99 mg/dL    Comment: Glucose reference range applies only to samples taken after fasting for at least 8 hours.  Basic metabolic panel     Status: Abnormal   Collection Time: 03/29/20  4:02 AM  Result Value Ref Range    Sodium 139 135 - 145 mmol/L   Potassium 3.5 3.5 - 5.1 mmol/L   Chloride 106 98 - 111 mmol/L   CO2 23 22 - 32 mmol/L   Glucose, Bld 213 (H) 70 - 99 mg/dL    Comment: Glucose reference range applies only to samples taken after fasting for at least 8 hours.   BUN 22 (H) 6 - 20 mg/dL   Creatinine, Ser 0.97 0.44 - 1.00 mg/dL  Calcium 9.0 8.9 - 10.3 mg/dL   GFR calc non Af Amer >60 >60 mL/min   GFR calc Af Amer >60 >60 mL/min   Anion gap 10 5 - 15    Comment: Performed at [facility], [address] [place], [place] [address]  Glucose, capillary     Status: Abnormal   Collection Time: 03/29/20  4:03 AM  Result Value Ref Range   Glucose-Capillary 197 (H) 70 - 99 mg/dL    Comment: Glucose reference range applies only to samples taken after fasting for at least 8 hours.  Glucose, capillary     Status: Abnormal   Collection Time: 03/29/20  5:14 AM  Result Value Ref Range   Glucose-Capillary 259 (H) 70 - 99 mg/dL    Comment: Glucose reference range applies only to samples taken after fasting for at least 8 hours.  Glucose, capillary     Status: Abnormal   Collection Time: 03/29/20  6:09 AM  Result Value Ref Range   Glucose-Capillary 196 (H) 70 - 99 mg/dL    Comment: Glucose reference range applies only to samples taken after fasting for at least 8 hours.  Glucose, capillary     Status: Abnormal   Collection Time: 03/29/20  7:03 AM  Result Value Ref Range   Glucose-Capillary 164 (H) 70 - 99 mg/dL    Comment: Glucose reference range applies only to samples taken after fasting for at least 8 hours.  Basic metabolic panel     Status: Abnormal   Collection Time: 03/29/20  7:04 AM  Result Value Ref Range   Sodium 140 135 - 145 mmol/L   Potassium 3.6 3.5 - 5.1 mmol/L   Chloride 107 98 - 111 mmol/L   CO2 24 22 - 32 mmol/L   Glucose, Bld 173 (H) 70 - 99 mg/dL    Comment: Glucose reference range applies only to samples taken after fasting for at least 8 hours.   BUN 21 (H) 6 - 20  mg/dL   Creatinine, Ser 1.14 (H) 0.44 - 1.00 mg/dL   Calcium 9.1 8.9 - 10.3 mg/dL   GFR calc non Af Amer >60 >60 mL/min   GFR calc Af Amer >60 >60 mL/min   Anion gap 9 5 - 15    Comment: Performed at [facility], [address] [place], [place] [address]  Beta-hydroxybutyric acid     Status: Abnormal   Collection Time: 03/29/20  7:04 AM  Result Value Ref Range   Beta-Hydroxybutyric Acid 0.48 (H) 0.05 - 0.27 mmol/L    Comment: Performed at [facility], [address] [place],  [address]  Glucose, capillary     Status: Abnormal   Collection Time: 03/29/20  8:06 AM  Result Value Ref Range   Glucose-Capillary 190 (H) 70 - 99 mg/dL    Comment: Glucose reference range applies only to samples taken after fasting for at least 8 hours.  Glucose, capillary     Status: Abnormal   Collection Time: 03/29/20  9:10 AM  Result Value Ref Range   Glucose-Capillary 173 (H) 70 - 99 mg/dL    Comment: Glucose reference range applies only to samples taken after fasting for at least 8 hours.  Glucose, capillary     Status: Abnormal   Collection Time: 03/29/20 10:02 AM  Result Value Ref Range   Glucose-Capillary 219 (H) 70 - 99 mg/dL    Comment: Glucose reference range applies only to samples taken after fasting for at least 8  hours.  Glucose, capillary     Status: Abnormal   Collection Time: 03/29/20  4:12 PM  Result Value Ref Range   Glucose-Capillary 443 (H) 70 - 99 mg/dL    Comment: Glucose reference range applies only to samples taken after fasting for at least 8 hours.  Basic metabolic panel     Status: Abnormal   Collection Time: 03/29/20  4:56 PM  Result Value Ref Range   Sodium 134 (L) 135 - 145 mmol/L   Potassium 4.4 3.5 - 5.1 mmol/L   Chloride 101 98 - 111 mmol/L   CO2 18 (L) 22 - 32 mmol/L   Glucose, Bld 475 (H) 70 - 99 mg/dL    Comment: Glucose reference range applies only to samples taken after fasting for at least 8 hours.   BUN 25 (H) 6 - 20 mg/dL    Creatinine, Ser 1.31 (H) 0.44 - 1.00 mg/dL   Calcium 9.1 8.9 - 10.3 mg/dL   GFR calc non Af Amer 56 (L) >60 mL/min   GFR calc Af Amer >60 >60 mL/min   Anion gap 15 5 - 15    Comment: Performed at [facility], [address] [place], [place] [address]  Glucose, capillary     Status: Abnormal   Collection Time: 03/29/20  5:36 PM  Result Value Ref Range   Glucose-Capillary 436 (H) 70 - 99 mg/dL    Comment: Glucose reference range applies only to samples taken after fasting for at least 8 hours.  Glucose, capillary     Status: Abnormal   Collection Time: 03/29/20  7:46 PM  Result Value Ref Range   Glucose-Capillary 363 (H) 70 - 99 mg/dL    Comment: Glucose reference range applies only to samples taken after fasting for at least 8 hours.  Basic metabolic panel     Status: Abnormal   Collection Time: 03/29/20 10:33 PM  Result Value Ref Range   Sodium 135 135 - 145 mmol/L   Potassium 3.7 3.5 - 5.1 mmol/L   Chloride 106 98 - 111 mmol/L   CO2 22 22 - 32 mmol/L   Glucose, Bld 326 (H) 70 - 99 mg/dL    Comment: Glucose reference range applies only to samples taken after fasting for at least 8 hours.   BUN 21 (H) 6 - 20 mg/dL   Creatinine, Ser 1.02 (H) 0.44 - 1.00 mg/dL   Calcium 8.8 (L) 8.9 - 10.3 mg/dL   GFR calc non Af Amer >60 >60 mL/min   GFR calc Af Amer >60 >60 mL/min   Anion gap 7 5 - 15    Comment: Performed at [facility], [address] [place], [place] [address]  Glucose, capillary     Status: Abnormal   Collection Time: 03/29/20 11:44 PM  Result Value Ref Range   Glucose-Capillary 277 (H) 70 - 99 mg/dL    Comment: Glucose reference range applies only to samples taken after fasting for at least 8 hours.  Glucose, capillary     Status: Abnormal   Collection Time: 03/30/20  4:08 AM  Result Value Ref Range   Glucose-Capillary 242 (H) 70 - 99 mg/dL    Comment: Glucose reference range applies only to samples taken after fasting for at least 8 hours.  Basic  metabolic panel     Status: Abnormal   Collection Time: 03/30/20  4:47 AM  Result Value Ref Range   Sodium 139 135 - 145 mmol/L   Potassium 3.8 3.5 -  5.1 mmol/L   Chloride 107 98 - 111 mmol/L   CO2 23 22 - 32 mmol/L   Glucose, Bld 262 (H) 70 - 99 mg/dL    Comment: Glucose reference range applies only to samples taken after fasting for at least 8 hours.   BUN 18 6 - 20 mg/dL   Creatinine, Ser 0.88 0.44 - 1.00 mg/dL   Calcium 8.5 (L) 8.9 - 10.3 mg/dL   GFR calc non Af Amer >60 >60 mL/min   GFR calc Af Amer >60 >60 mL/min   Anion gap 9 5 - 15    Comment: Performed at [facility], [address] [place], [place] [address]  CBC     Status: None   Collection Time: 03/30/20  4:47 AM  Result Value Ref Range   WBC 10.1 4.0 - 10.5 K/uL   RBC 4.77 3.87 - 5.11 MIL/uL   Hemoglobin 12.5 12.0 - 15.0 g/dL   HCT 40.3 36.0 - 46.0 %   MCV 84.5 80.0 - 100.0 fL   MCH 26.2 26.0 - 34.0 pg   MCHC 31.0 30.0 - 36.0 g/dL   RDW 15.4 11.5 - 15.5 %   Platelets 257 150 - 400 K/uL   nRBC 0.0 0.0 - 0.2 %    Comment: Performed at [facility], [address] [place], [place] [address]  Glucose, capillary     Status: Abnormal   Collection Time: 03/30/20  7:48 AM  Result Value Ref Range   Glucose-Capillary 218 (H) 70 - 99 mg/dL    Comment: Glucose reference range applies only to samples taken after fasting for at least 8 hours.  Glucose, capillary     Status: Abnormal   Collection Time: 03/30/20 11:22 AM  Result Value Ref Range   Glucose-Capillary 304 (H) 70 - 99 mg/dL    Comment: Glucose reference range applies only to samples taken after fasting for at least 8 hours.    Current Facility-Administered Medications  Medication Dose Route Frequency Provider Last Rate Last Admin  . 0.9 %  sodium chloride infusion   Intravenous Continuous Dhungel, Nishant, MD 125 mL/hr at 03/30/20 1015 Rate Verify at 03/30/20 1015  . alum & mag hydroxide-simeth (MAALOX/MYLANTA) 200-200-20 MG/5ML suspension  30 mL  30 mL Oral Q4H PRN Lang Snow, NP   30 mL at 03/30/20 0036  . dextrose 50 % solution 0-50 mL  0-50 mL Intravenous PRN Carrie Mew, MD      . docusate sodium (COLACE) capsule 100 mg  100 mg Oral BID Lang Snow, NP   100 mg at 03/30/20 0841  . enoxaparin (LOVENOX) injection 40 mg  40 mg Subcutaneous Q12H Hallaji, Sheema M, RPH   40 mg at 03/30/20 0841  . fluconazole (DIFLUCAN) tablet 100 mg  100 mg Oral Daily Dhungel, Nishant, MD   100 mg at 03/30/20 0843  . insulin aspart (novoLOG) injection 0-15 Units  0-15 Units Subcutaneous Q4H Dhungel, Nishant, MD   11 Units at 03/30/20 1234  . insulin aspart (novoLOG) injection 5 Units  5 Units Subcutaneous TID WC Dhungel, Nishant, MD   5 Units at 03/30/20 1235  . insulin glargine (LANTUS) injection 25 Units  25 Units Subcutaneous Daily Dhungel, Nishant, MD   25 Units at 03/30/20 0842  . insulin starter kit- pen needles (English) 1 kit  1 kit Other Once Dhungel, Nishant, MD      . lamoTRIgine (LAMICTAL) tablet 200 mg  200 mg Oral Daily Agbata, Tochukwu, MD  200 mg at 03/30/20 0841  . living well with diabetes book MISC   Does not apply Once Dhungel, Nishant, MD      . lurasidone (LATUDA) tablet 40 mg  40 mg Oral Q breakfast Agbata, Tochukwu, MD   40 mg at 03/30/20 1004  . melatonin tablet 5 mg  5 mg Oral QHS Agbata, Tochukwu, MD   5 mg at 03/29/20 2117  . promethazine (PHENERGAN) injection 12.5 mg  12.5 mg Intravenous Q6H PRN Lang Snow, NP   12.5 mg at 03/29/20 0131    Musculoskeletal: Strength & Muscle Tone: decreased Gait & Station: Unable to assess Patient leans: NA  Psychiatric Specialty Exam: Physical Exam  Nursing note and vitals reviewed. Musculoskeletal:        General: Normal range of motion.     Cervical back: Normal range of motion and neck supple.    Review of Systems  Psychiatric/Behavioral: Positive for confusion.    Blood pressure (!) 144/98, pulse 78, temperature 98.6 F (37 C),  temperature source Oral, resp. rate 17, height '5\' 7"'  (1.702 m), weight 136.1 kg, SpO2 99 %, unknown if currently breastfeeding.Body mass index is 46.99 kg/m.  General Appearance: Bizarre, Disheveled and Guarded  Eye Contact:  Minimal  Speech:  Clear and Coherent and Slow  Volume:  Decreased  Mood:  Depressed, Dysphoric and Worthless  Affect:  Blunt, Non-Congruent and smiling at times  Thought Process:  Linear and Descriptions of Associations: Intact  Orientation:  Full (Time, Place, and Person)  Thought Content:  Logical  Suicidal Thoughts:  Yes.  with intent/plan  Homicidal Thoughts:  No  Memory:  Immediate;   Fair Recent;   Poor Remote;   Poor  Judgement:  Poor  Insight:  Shallow  Psychomotor Activity:  Psychomotor Retardation  Concentration:  Concentration: Poor  Recall:  Poor  Fund of Knowledge:  Poor  Language:  Poor  Akathisia:  Negative  Handed:  Right  AIMS (if indicated):     Assets:  Armed forces logistics/support/administrative officer Physical Health Resilience Social Support  ADL's:  Impaired  Cognition:  Impaired,  Mild  Sleep:    Good     Treatment Plan Summary: Daily contact with patient to assess and evaluate symptoms and progress in treatment, Medication management and Plan Recommend inpatient admission once medically cleared. Patient with some loss of bladder control. Denies any symptoms of cauda equina or neurogenic bladder.   Disposition: Recommend psychiatric Inpatient admission when medically cleared. Will start home medications at this time. Patient will need inpatient admission, and will benefit from a psychological evaluation upon discharge. 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 Mom was unable to forward much information as she reported she was at [place] and unable to talk.  Will start  prozac 74m po daily for depression, and titrate as needed.   TSuella Broad FNP 03/30/2020 2:17 PM

## 2020-03-31 LAB — GLUCOSE, CAPILLARY
Glucose-Capillary: 211 mg/dL — ABNORMAL HIGH (ref 70–99)
Glucose-Capillary: 218 mg/dL — ABNORMAL HIGH (ref 70–99)
Glucose-Capillary: 246 mg/dL — ABNORMAL HIGH (ref 70–99)
Glucose-Capillary: 261 mg/dL — ABNORMAL HIGH (ref 70–99)
Glucose-Capillary: 296 mg/dL — ABNORMAL HIGH (ref 70–99)

## 2020-03-31 MED ORDER — INSULIN ASPART 100 UNIT/ML ~~LOC~~ SOLN
8.0000 [IU] | Freq: Three times a day (TID) | SUBCUTANEOUS | Status: DC
  Administered 2020-03-31 – 2020-04-01 (×3): 8 [IU] via SUBCUTANEOUS
  Filled 2020-03-31 (×3): qty 1

## 2020-03-31 MED ORDER — INSULIN GLARGINE 100 UNIT/ML ~~LOC~~ SOLN
40.0000 [IU] | Freq: Every day | SUBCUTANEOUS | Status: DC
  Administered 2020-03-31 – 2020-04-01 (×2): 40 [IU] via SUBCUTANEOUS
  Filled 2020-03-31 (×2): qty 0.4

## 2020-03-31 MED ORDER — LACTULOSE 10 GM/15ML PO SOLN
20.0000 g | Freq: Three times a day (TID) | ORAL | Status: DC
  Administered 2020-03-31 – 2020-04-04 (×5): 20 g via ORAL
  Filled 2020-03-31 (×7): qty 30

## 2020-03-31 NOTE — Progress Notes (Signed)
PROGRESS NOTE  Cindy Leonard YKD:[id number] DOB: 06-Nov-1995 DOA: 03/28/2020 PCP: Ladell Pier, MD  Brief History   24 year old morbidly obese female with history of diabetes mellitus, bipolar disorder, schizophrenia who presented with generalized weakness, polydipsia, polyuria for almost 2 weeks.  For the past 3 days she reported being extremely weak and lying in the floor at home and urinating on herself.  Patient has not been compliant with her prescribed medication as it does not make her feel well.  Patient and her mother are unaware of prior history of diabetes. In the ED she was found to be in DKA with blood glucose of 900, sodium 127, K of 5.7, creatinine 1.65, WBC of 14 and anion gap of 24. Patient admitted for DKA.  Given aggressive IV hydration and started on insulin drip. In the ED she also expressed feeling very depressed and being suicidal.  Psychiatry consulted.  DKA is resolved. Psychiatry has been re-consulted as suggested now that she is medically clear for discharge. They have recommended inpatient psychiatry treatment and restarted the patient's home medications. Blood sugars remain uncontrolled despite adjustments to short and long acting insulin.  Consultants  . Psychiatry  Procedures  . None  Antibiotics   Anti-infectives (From admission, onward)   Start     Dose/Rate Route Frequency Ordered Stop   03/29/20 1000  fluconazole (DIFLUCAN) tablet 100 mg     100 mg Oral Daily 03/29/20 0831       Subjective  The patient is awake, alert, and oriented x 3. No acute distress.  Objective   Vitals:  Vitals:   03/31/20 0355 03/31/20 1152  BP: 126/79 121/71  Pulse: 77 79  Resp: 19 16  Temp: 98.5 F (36.9 C) 98.5 F (36.9 C)  SpO2: 98% 100%   Exam:  Constitutional:  . The patient is sleeping soundly. No acute distress. Respiratory:  . No increased work of breathing. . No wheezes, rales, or rhonchi . No tactile fremitus Cardiovascular:  . Regular rate  and rhythm . No murmurs, ectopy, or gallups. . No lateral PMI. No thrills. Abdomen:  . Abdomen is soft, non-tender, non-distended . No hernias, masses, or organomegaly . Normoactive bowel sounds.  Musculoskeletal:  . No cyanosis, clubbing, or edema Skin:  . No rashes, lesions, ulcers . palpation of skin: no induration or nodules Neurologic:  Unable to evaluate due to patient's inability to cooperate with exam. Psychiatric:  . Unable to evaluate due to patient's inability to cooperate with exam.   I have personally reviewed the following:   Today's Data  . Vitals, blood sugars  Scheduled Meds: . docusate sodium  100 mg Oral BID  . enoxaparin (LOVENOX) injection  40 mg Subcutaneous Q12H  . fluconazole  100 mg Oral Daily  . FLUoxetine  10 mg Oral QHS  . insulin aspart  0-15 Units Subcutaneous TID WC  . insulin aspart  0-5 Units Subcutaneous QHS  . insulin aspart  5 Units Subcutaneous TID WC  . insulin glargine  40 Units Subcutaneous Daily  . insulin starter kit- pen needles  1 kit Other Once  . lactulose  20 g Oral TID  . lamoTRIgine  200 mg Oral Daily  . living well with diabetes book   Does not apply Once  . lurasidone  40 mg Oral Q breakfast  . melatonin  5 mg Oral QHS  . nystatin cream   Topical BID   Continuous Infusions: . sodium chloride 125 mL/hr at 03/31/20 0427  Principal Problem:   DKA (diabetic ketoacidoses) (HCC) Active Problems:   Hx of bipolar disorder   Schizophrenia ([place])   Acne   Depression   Hyponatremia   LOS: 3 days   A & P   DKA (diabetic ketoacidoses) and type 2 diabetes mellitus ([place]):  DKA currently resolved with aggressive IV hydration and insulin drip.  Patient still feels dehydrated and will continue maintenance normal saline at 125 cc/h.  Started on subcu Lantus with sliding scale coverage.  Will likely need insulin upon discharge.  Patient and mother willing to be on insulin.  Holding Metformin.  Patient and mother report that  they are unaware of being diagnosis of diabetes.  As per prior labs her last A1c was 7.7, 1 year ago. [facility] and her diabetes was addressed. She is also on metformin for the same. Her DKA is secondary to dietary non adherence and medication non compliance.   DM II: Uncontrolled. The patient is receiving Lantus 40 units daily as well as SSI guided by FSBS qac and hs with 5 units of novolog with meals. FSBS over the past 24 hours has run from 218 - 369.   Oral thrush/Vulvovaginal Candidiasis: The patient is receiving fluconazole and nystatin cream.   Hx of bipolar disorder and schizophrenia with?  Suicidal ideation. The patient has been  Psych consult following.  Psychiatry has re-evaluated her. She will be sent to inpatient psychiatry.  Morbid obesity: Noted. Counseled on diet and exercise to lose weight and significant lifestyle modification.  Acute kidney injury: Resolved. Monitor.  I have seen and examined this patient myself. I have spent 30 minutes in her evaluation and care.  DVT prophylaxis: Subcu Lovenox Code Status: Full code Family Communication: Mother at bedside Disposition:   Status is: Inpatient  Remains inpatient appropriate because:Blood sugars remain uncontrolled.  Dispo: The patient is from: Home  Anticipated d/c is to: Inpatient psychiatry.  Anticipated d/c date is: 1 [place]  Patient currently is not medically stable to d/c. Barriers to discharge: uncontrolled glucoses and  bed in inpatient psychiatric facility.  Cindy Bergren, DO [facility] Direct contact: see [email or web address]  7PM-7AM contact night coverage as above 03/31/2020, 12:17 PM  LOS: 2 days

## 2020-03-31 NOTE — Plan of Care (Signed)
Patient states lactulose effective and having soft bowel movements x3.  No suicidial ideation verbalized .

## 2020-03-31 NOTE — Progress Notes (Signed)
Inpatient Diabetes Program Recommendations  AACE/ADA: New Consensus Statement on Inpatient Glycemic Control (2015)  Target Ranges:  Prepandial:   less than 140 mg/dL      Peak postprandial:   less than 180 mg/dL (1-2 hours)      Critically ill patients:  140 - 180 mg/dL   Lab Results  Component Value Date   GLUCAP 296 (H) 03/31/2020   HGBA1C 7.7 (A) 09/12/2018    Review of Glycemic Control  CBGs 246, 296 mg/dL. Lantus increased to 40 units QD Post-prandials continue to be elevated.  Inpatient Diabetes Program Recommendations:     Increase Novolog to 8 units tidwc for meal coverage insulin.  Continue to follow closely.  Thank you. Ailene Ards, RD, LDN, CDE Inpatient Diabetes Coordinator [phone]

## 2020-04-01 DIAGNOSIS — E1165 Type 2 diabetes mellitus with hyperglycemia: Secondary | ICD-10-CM

## 2020-04-01 DIAGNOSIS — E1110 Type 2 diabetes mellitus with ketoacidosis without coma: Principal | ICD-10-CM

## 2020-04-01 DIAGNOSIS — F329 Major depressive disorder, single episode, unspecified: Secondary | ICD-10-CM

## 2020-04-01 LAB — GLUCOSE, CAPILLARY
Glucose-Capillary: 233 mg/dL — ABNORMAL HIGH (ref 70–99)
Glucose-Capillary: 291 mg/dL — ABNORMAL HIGH (ref 70–99)
Glucose-Capillary: 171 mg/dL — ABNORMAL HIGH (ref 70–99)
Glucose-Capillary: 227 mg/dL — ABNORMAL HIGH (ref 70–99)

## 2020-04-01 LAB — HEMOGLOBIN A1C
Hgb A1c MFr Bld: 13.6 % — ABNORMAL HIGH (ref 4.8–5.6)
Mean Plasma Glucose: 343.62 mg/dL

## 2020-04-01 MED ORDER — INSULIN GLARGINE 100 UNIT/ML ~~LOC~~ SOLN
44.0000 [IU] | Freq: Every day | SUBCUTANEOUS | Status: DC
  Administered 2020-04-02 – 2020-04-03 (×2): 44 [IU] via SUBCUTANEOUS
  Filled 2020-04-01 (×2): qty 0.44

## 2020-04-01 MED ORDER — INSULIN ASPART 100 UNIT/ML ~~LOC~~ SOLN
10.0000 [IU] | Freq: Three times a day (TID) | SUBCUTANEOUS | Status: DC
  Administered 2020-04-01 – 2020-04-04 (×8): 10 [IU] via SUBCUTANEOUS
  Filled 2020-04-01 (×8): qty 1

## 2020-04-01 NOTE — Progress Notes (Signed)
PROGRESS NOTE    Cindy Leonard  ZXY:[id number] DOB: [birth year] DOA: 03/28/2020 PCP: Ladell Pier, MD   Chief Complaint  Patient presents with  . Hyperglycemia  . Depression    Brief Narrative:   24 year old morbidly obese female with history of diabetes mellitus, bipolar disorder, schizophrenia who presented with generalized weakness, polydipsia, polyuria for almost 2 weeks. For the past 3 days she reported being extremely weak and lying in the floor at home and urinating on herself. Patient has not been compliant with her prescribed medication as it does not make her feel well. Patient and her mother are unaware of prior history of diabetes. In the ED she was found to be in DKA with blood glucose of 900, sodium 127, K of 5.7, creatinine 1.65, WBC of 14 and anion gap of 24. Patient admitted for DKA. Given aggressive IV hydration and started on insulin drip. In the ED she also expressed feeling very depressed and being suicidal.   DKA  resolved.  Psychiatry following and deciding on inpatient psych admission.  Assessment & Plan:   Principal Problem: Uncontrolled type 2 diabetes mellitus with hyperglycemia Presented with DKA that has resolved.  Patient nonadherent to medication and diet.  Currently on Lantus 40 units daily and Premeal aspart.  CBG still in 200s.  Will increase Lantus to 44 units and increase NovoLog to 10 units 3 times daily with meals.  A1c still pending on admission, I will reorder. Patient and mother agree to be on insulin upon discharge.  Metformin currently held.  Active Problems:   Hx of bipolar disorder with depression   Schizophrenia ([place])  suicidal ideation upon admission.  Safety sitter at bedside.  Psych following.  On Latuda and fluoxetine.  Oral and vaginal candidiasis Symptoms improved.  Continue fluconazole for 7-[place] course.  Acute kidney injury Renal secondary to dehydration.  Resolved with fluids.  DVT prophylaxis: sq lovenox Code  Status: Full code Family Communication: None at bedside.  Mother involved in care. Disposition:   Status is: Inpatient  Remains inpatient appropriate because:Unsafe d/c plan   Dispo: The patient is from: Home              Anticipated d/c is to: Inpatient psych versus home              Anticipated d/c date is: 1 [place]              Patient currently is not medically stable to d/c.        Consultants:   Psych  Procedures: None  Antimicrobials: Fluconazole   Subjective: Seen and examined.  Denies any symptoms.  No difficulty swallowing or vaginal itching.  Objective: Vitals:   03/31/20 1152 03/31/20 1606 03/31/20 2000 04/01/20 1155  BP: 121/71 129/90 119/88 139/89  Pulse: 79 86 77 84  Resp: _0 Temp: 98.5 F (36.9 C) 98.6 F (37 C) 99.1 F (37.3 C) 98.5 F (36.9 C)  TempSrc: Oral Oral Oral Oral  SpO2: 100% 99% 99% 99%  Weight:      Height:        Intake/Output Summary (Last 24 hours) at 04/01/2020 1407 Last data filed at 04/01/2020 0506 Gross per 24 hour  Intake 3057.06 ml  Output --  Net 3057.06 ml   Filed Weights   03/28/20 1048  Weight: 136.1 kg    Examination:  General: Young obese female not in distress HEENT: Moist mucosa, supple neck Chest: Clear CVs: Normal S1-S2 GI: Soft,  nondistended, nontender Musculoskeletal: Warm, no edema    Data Reviewed: I have personally reviewed following labs and imaging studies  CBC: Recent Labs  Lab 03/28/20 1059 03/30/20 0447  WBC 14.7* 10.1  HGB 14.8 12.5  HCT 48.3* 40.3  MCV 86.1 84.5  PLT 373 301    Basic Metabolic Panel: Recent Labs  Lab 03/28/20 1059 03/28/20 1545 03/29/20 0402 03/29/20 0704 03/29/20 1656 03/29/20 2233 03/30/20 0447  NA 127*   < > 139 140 134* 135 139  K 5.7*   < > 3.5 3.6 4.4 3.7 3.8  CL 89*   < > 106 107 101 106 107  CO2 14*   < > 23 24 18* 22 23  GLUCOSE 979*   < > 213* 173* 475* 326* 262*  BUN 28*   < > 22* 21* 25* 21* 18  CREATININE 1.65*   < > 0.97  1.14* 1.31* 1.02* 0.88  CALCIUM 9.3   < > 9.0 9.1 9.1 8.8* 8.5*  MG 2.5*  --   --   --   --   --   --    < > = values in this interval not displayed.    GFR: Estimated Creatinine Clearance: 141 mL/min (by C-G formula based on SCr of 0.88 mg/dL).  Liver Function Tests: Recent Labs  Lab 03/28/20 1059  AST 29  ALT 26  ALKPHOS 104  BILITOT 2.0*  PROT 8.6*  ALBUMIN 3.9    CBG: Recent Labs  Lab 03/31/20 1208 03/31/20 1656 03/31/20 2116 04/01/20 0740 04/01/20 1144  GLUCAP 296* 261* 211* 233* 291*     Recent Results (from the past 240 hour(s))  SARS Coronavirus 2 by RT PCR (hospital order, performed in [facility] hospital lab) Nasopharyngeal Nasopharyngeal Swab     Status: None   Collection Time: 03/28/20  9:56 AM   Specimen: Nasopharyngeal Swab  Result Value Ref Range Status   SARS Coronavirus 2 NEGATIVE NEGATIVE Final    Comment: (NOTE) SARS-CoV-2 target nucleic acids are NOT DETECTED. The SARS-CoV-2 RNA is generally detectable in upper and lower respiratory specimens during the acute phase of infection. The lowest concentration of SARS-CoV-2 viral copies this assay can detect is 250 copies / mL. A negative result does not preclude SARS-CoV-2 infection and should not be used as the sole basis for treatment or other patient management decisions.  A negative result may occur with improper specimen collection / handling, submission of specimen other than nasopharyngeal swab, presence of viral mutation(s) within the areas targeted by this assay, and inadequate number of viral copies (<250 copies / mL). A negative result must be combined with clinical observations, patient history, and epidemiological information. Fact Sheet for Patients:   StrictlyIdeas.no Fact Sheet for Healthcare Providers: BankingDealers.co.za This test is not yet approved or cleared  by the [place] FDA and has been authorized for detection and/or  diagnosis of SARS-CoV-2 by FDA under an Emergency Use Authorization (EUA).  This EUA will remain in effect (meaning this test can be used) for the duration of the COVID-19 declaration under Section 564(b)(1) of the Act, 21 U.S.C. section 360bbb-3(b)(1), unless the authorization is terminated or revoked sooner. Performed at [facility], [address], [place], [place] [address]          Radiology Studies: No results found.      Scheduled Meds: . docusate sodium  100 mg Oral BID  . enoxaparin (LOVENOX) injection  40 mg Subcutaneous Q12H  . fluconazole  100  mg Oral Daily  . FLUoxetine  10 mg Oral QHS  . insulin aspart  0-15 Units Subcutaneous TID WC  . insulin aspart  0-5 Units Subcutaneous QHS  . insulin aspart  8 Units Subcutaneous TID WC  . insulin glargine  40 Units Subcutaneous Daily  . insulin starter kit- pen needles  1 kit Other Once  . lactulose  20 g Oral TID  . lamoTRIgine  200 mg Oral Daily  . living well with diabetes book   Does not apply Once  . lurasidone  40 mg Oral Q breakfast  . melatonin  5 mg Oral QHS  . nystatin cream   Topical BID   Continuous Infusions:   LOS: 4 days    Time spent: 25 minutes    Caellum Mancil, MD [facility]   To contact the attending provider between 7A-7P or the covering provider during after hours 7P-7A, please log into the web site [email or web address] and access using universal [place] password for that web site. If you do not have the password, please call the hospital operator.  04/01/2020, 2:07 PM

## 2020-04-01 NOTE — Progress Notes (Signed)
Inpatient Diabetes Program Recommendations  AACE/ADA: New Consensus Statement on Inpatient Glycemic Control (2015)  Target Ranges:  Prepandial:   less than 140 mg/dL      Peak postprandial:   less than 180 mg/dL (1-2 hours)      Critically ill patients:  140 - 180 mg/dL   Lab Results  Component Value Date   GLUCAP 233 (H) 04/01/2020   HGBA1C 7.7 (A) 09/12/2018    Review of Glycemic Control  FBS 233 Post-prandials continue above goal of 180 mg/dL. Continue titration.   Inpatient Diabetes Program Recommendations:     Increase Lantus to 44 units QD Increase Novolog to 10 units tidwc.  Continue to follow.  Thank you. Ailene Ards, RD, LDN, CDE Inpatient Diabetes Coordinator [phone]
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
## 2020-04-02 DIAGNOSIS — F23 Brief psychotic disorder: Secondary | ICD-10-CM

## 2020-04-02 DIAGNOSIS — E0865 Diabetes mellitus due to underlying condition with hyperglycemia: Secondary | ICD-10-CM

## 2020-04-02 LAB — HEMOGLOBIN A1C
Hgb A1c MFr Bld: 14 % — ABNORMAL HIGH (ref 4.8–5.6)
Mean Plasma Glucose: 355 mg/dL

## 2020-04-02 LAB — GLUCOSE, CAPILLARY
Glucose-Capillary: 204 mg/dL — ABNORMAL HIGH (ref 70–99)
Glucose-Capillary: 230 mg/dL — ABNORMAL HIGH (ref 70–99)
Glucose-Capillary: 230 mg/dL — ABNORMAL HIGH (ref 70–99)
Glucose-Capillary: 161 mg/dL — ABNORMAL HIGH (ref 70–99)

## 2020-04-02 MED ORDER — HALOPERIDOL 5 MG PO TABS
5.0000 mg | ORAL_TABLET | Freq: Every day | ORAL | Status: DC
  Administered 2020-04-02 – 2020-04-03 (×2): 5 mg via ORAL
  Filled 2020-04-02 (×3): qty 1

## 2020-04-02 MED ORDER — HALOPERIDOL LACTATE 5 MG/ML IJ SOLN: 5.0000 mg | Freq: Once | INTRAMUSCULAR | Status: DC

## 2020-04-02 MED ORDER — OLANZAPINE 10 MG IM SOLR
5.0000 mg | Freq: Four times a day (QID) | INTRAMUSCULAR | Status: DC | PRN
  Filled 2020-04-02 (×2): qty 10

## 2020-04-02 MED ORDER — DIPHENHYDRAMINE HCL 50 MG/ML IJ SOLN
25.0000 mg | Freq: Once | INTRAMUSCULAR | Status: AC
  Administered 2020-04-02: 25 mg via INTRAVENOUS
  Filled 2020-04-02: qty 1

## 2020-04-02 MED ORDER — HALOPERIDOL LACTATE 5 MG/ML IJ SOLN
INTRAMUSCULAR | Status: AC
  Filled 2020-04-02: qty 1

## 2020-04-02 MED ORDER — BENZTROPINE MESYLATE 0.5 MG PO TABS
0.5000 mg | ORAL_TABLET | Freq: Two times a day (BID) | ORAL | Status: DC
  Administered 2020-04-02 – 2020-04-04 (×4): 0.5 mg via ORAL
  Filled 2020-04-02 (×6): qty 1

## 2020-04-02 MED ORDER — CLONAZEPAM 1 MG PO TABS
1.0000 mg | ORAL_TABLET | Freq: Two times a day (BID) | ORAL | Status: DC
  Administered 2020-04-02 – 2020-04-04 (×4): 1 mg via ORAL
  Filled 2020-04-02 (×4): qty 1

## 2020-04-02 MED ORDER — LORAZEPAM 2 MG/ML IJ SOLN
2.0000 mg | Freq: Once | INTRAMUSCULAR | Status: AC
  Administered 2020-04-02: 2 mg via INTRAVENOUS
  Filled 2020-04-02 (×2): qty 1

## 2020-04-02 MED ORDER — HALOPERIDOL LACTATE 5 MG/ML IJ SOLN: 1.0000 mg | Freq: Four times a day (QID) | INTRAMUSCULAR | Status: DC | PRN
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
## 2020-04-02 NOTE — Progress Notes (Signed)
PROGRESS NOTE    Cindy Leonard  PNT:[id number] DOB: 1996/05/04 DOA: 03/28/2020 PCP: Ladell Pier, MD   Chief Complaint  Patient presents with  . Hyperglycemia  . Depression    Brief Narrative:   24 year old morbidly obese female with history of diabetes mellitus, bipolar disorder, schizophrenia who presented with generalized weakness, polydipsia, polyuria for almost 2 weeks. For the past 3 days she reported being extremely weak and lying in the floor at home and urinating on herself. Patient has not been compliant with her prescribed medication as it does not make her feel well. Patient and her mother are unaware of prior history of diabetes. In the ED she was found to be in DKA with blood glucose of 900, sodium 127, K of 5.7, creatinine 1.65, WBC of 14 and anion gap of 24. Patient admitted for DKA. Given aggressive IV hydration and started on insulin drip. In the ED she also expressed feeling very depressed and being suicidal.   DKA  resolved.  Psychiatry following and deciding on inpatient psych admission.  On 6/1 patient became agitated wanting to go home.  Had to be involuntarily committed.  Given IV Haldol, Ativan and Benadryl followed by IM Zyprexa.  Assessment & Plan:   Principal Problem: bipolar disorder with depression   Schizophrenia with acute psychosis ([place])  suicidal ideation upon admission.  Safety sitter at bedside.  Psychiatry following and recommends inpatient psych admit.  Awaiting bed.  Patient is medically stable for discharge. Now involuntarily committed.  Received Haldol and Zyprexa for agitation.  Continue as needed.    Uncontrolled type 2 diabetes mellitus with hyperglycemia Presented with DKA that has resolved.  Patient nonadherent to medication and diet.  CBGs in 200s.  Lantus dose increased to 44 units and added premeal NovoLog.  A1c of 13.6.   Patient and mother agree to be on insulin upon discharge.  Metformin currently held.  Active  Problems:   Oral and vaginal candidiasis Symptoms improved.  Continue fluconazole for 7-[place] course.  Acute kidney injury Prerenal secondary to dehydration.  Resolved with fluids.  DVT prophylaxis: sq lovenox Code Status: Full code Family Communication: Mother at bedside Disposition:   Status is: Inpatient.  Needs inpatient psychiatry admission and awaiting bed.  Patient unsafe to be discharged home with risk for self-harm.  Remains inpatient appropriate because:Unsafe d/c plan   Dispo: The patient is from: Home              Anticipated d/c is to: Inpatient psychiatry admission              Anticipated d/c date is: 1 [place]              Patient currently is not medically stable to d/c.        Consultants:   Psychiatry  Procedures: None  Antimicrobials: Fluconazole   Subjective: Patient agitated wanting to go home.  Involuntarily committed and had to be aggressively sedated with Haldol, Zyprexa and Ativan.  Objective: Vitals:   04/02/20 0621 04/02/20 1209 04/02/20 1431 04/02/20 1632  BP: (!) 131/96 (!) 132/92 136/90 128/75  Pulse: 75 84 (!) 117 86  Resp: '18 19 15 14  ' Temp: 97.9 F (36.6 C) 97.6 F (36.4 C) 98 F (36.7 C) 98 F (36.7 C)  TempSrc: Oral Oral Oral Oral  SpO2: 99% 98% 97% 98%  Weight:      Height:        Intake/Output Summary (Last 24 hours) at 04/02/2020 1651 Last data  filed at 04/01/2020 1655 Gross per 24 hour  Intake 1208.6 ml  Output --  Net 1208.6 ml   Filed Weights   03/28/20 1048  Weight: 136.1 kg   Physical exam Young obese female, agitated HEENT: Moist mucosa, supple neck Chest: Clear CVs: Normal S1-S2 GI: Soft, nondistended, nontender Musculoskeletal: Warm, no edema   Data Reviewed: I have personally reviewed following labs and imaging studies  CBC: Recent Labs  Lab 03/28/20 1059 03/30/20 0447  WBC 14.7* 10.1  HGB 14.8 12.5  HCT 48.3* 40.3  MCV 86.1 84.5  PLT 373 893    Basic Metabolic Panel: Recent Labs  Lab  03/28/20 1059 03/28/20 1545 03/29/20 0402 03/29/20 0704 03/29/20 1656 03/29/20 2233 03/30/20 0447  NA 127*   < > 139 140 134* 135 139  K 5.7*   < > 3.5 3.6 4.4 3.7 3.8  CL 89*   < > 106 107 101 106 107  CO2 14*   < > 23 24 18* 22 23  GLUCOSE 979*   < > 213* 173* 475* 326* 262*  BUN 28*   < > 22* 21* 25* 21* 18  CREATININE 1.65*   < > 0.97 1.14* 1.31* 1.02* 0.88  CALCIUM 9.3   < > 9.0 9.1 9.1 8.8* 8.5*  MG 2.5*  --   --   --   --   --   --    < > = values in this interval not displayed.    GFR: Estimated Creatinine Clearance: 141 mL/min (by C-G formula based on SCr of 0.88 mg/dL).  Liver Function Tests: Recent Labs  Lab 03/28/20 1059  AST 29  ALT 26  ALKPHOS 104  BILITOT 2.0*  PROT 8.6*  ALBUMIN 3.9    CBG: Recent Labs  Lab 04/01/20 1637 04/01/20 2202 04/02/20 0739 04/02/20 1206 04/02/20 1637  GLUCAP 171* 227* 204* 230* 230*     Recent Results (from the past 240 hour(s))  SARS Coronavirus 2 by RT PCR (hospital order, performed in [facility] hospital lab) Nasopharyngeal Nasopharyngeal Swab     Status: None   Collection Time: 03/28/20  9:56 AM   Specimen: Nasopharyngeal Swab  Result Value Ref Range Status   SARS Coronavirus 2 NEGATIVE NEGATIVE Final    Comment: (NOTE) SARS-CoV-2 target nucleic acids are NOT DETECTED. The SARS-CoV-2 RNA is generally detectable in upper and lower respiratory specimens during the acute phase of infection. The lowest concentration of SARS-CoV-2 viral copies this assay can detect is 250 copies / mL. A negative result does not preclude SARS-CoV-2 infection and should not be used as the sole basis for treatment or other patient management decisions.  A negative result may occur with improper specimen collection / handling, submission of specimen other than nasopharyngeal swab, presence of viral mutation(s) within the areas targeted by this assay, and inadequate number of viral copies (<250 copies / mL). A negative result must be  combined with clinical observations, patient history, and epidemiological information. Fact Sheet for Patients:   StrictlyIdeas.no Fact Sheet for Healthcare Providers: BankingDealers.co.za This test is not yet approved or cleared  by the [place] FDA and has been authorized for detection and/or diagnosis of SARS-CoV-2 by FDA under an Emergency Use Authorization (EUA).  This EUA will remain in effect (meaning this test can be used) for the duration of the COVID-19 declaration under Section 564(b)(1) of the Act, 21 U.S.C. section 360bbb-3(b)(1), unless the authorization is terminated or revoked sooner. Performed at [facility], [phone]  [address], [place], [place] [address]          Radiology Studies: No results found.      Scheduled Meds: . benztropine  0.5 mg Oral BID  . clonazePAM  1 mg Oral BID  . docusate sodium  100 mg Oral BID  . enoxaparin (LOVENOX) injection  40 mg Subcutaneous Q12H  . fluconazole  100 mg Oral Daily  . FLUoxetine  10 mg Oral QHS  . haloperidol  5 mg Oral QHS  . haloperidol lactate  5 mg Intravenous Once  . insulin aspart  0-15 Units Subcutaneous TID WC  . insulin aspart  0-5 Units Subcutaneous QHS  . insulin aspart  10 Units Subcutaneous TID WC  . insulin glargine  44 Units Subcutaneous Daily  . insulin starter kit- pen needles  1 kit Other Once  . lactulose  20 g Oral TID  . lamoTRIgine  200 mg Oral Daily  . living well with diabetes book   Does not apply Once  . melatonin  5 mg Oral QHS  . nystatin cream   Topical BID   Continuous Infusions:   LOS: 5 days    Time spent: 35 minutes    Cindy Reitan, MD [facility]   To contact the attending provider between 7A-7P or the covering provider during after hours 7P-7A, please log into the web site [email or web address] and access using universal [place] password for that web site. If you do not have the password, please call  the hospital operator.  04/02/2020, 4:51 PM

## 2020-04-02 NOTE — Progress Notes (Signed)
[facility] MD Progress Note  04/02/2020 2:23 PM Cindy Leonard  MRN:  [id number]   Subjective:  Today code was called for agitation, psychotic, OOC behaviors, requiring hold, IM ER meds and all.   Currently still in progress pending sedation and control of behaviors   She was seen by me at length in ER on 03/29/20  Note did not capture.   Spoke with mom at length   History of above problem but now with new onset DKA ---she now has relapse of severe psychotic, anxious and manic symptoms from need for more and different medications, unclear compliance, and also possible severe relapse and breakthrough.  Has been coming down from the DKA ---patient was asleep when I came on 5/28---and I spoke mainly to [place] was redone by weekend MD    Now needing new and different meds and also Psych admit ---after medical stabilization        Principal Problem: DKA (diabetic ketoacidoses) ([place]) Diagnosis: Principal Problem:   DKA (diabetic ketoacidoses) ([place]) Active Problems:   Hx of bipolar disorder   Schizophrenia ([place])   Acne   Depression   Hyponatremia morbid obesity    Total Time spent with patient:   30 plus one hour on 03/29/20--this note is combination of 5/28 and today    Past Psychiatric History:  History of Schizoaffective disorder, bipolar type   And above issues --coming down from DKA issues   Past Medical History:  Past Medical History:  Diagnosis Date  . Bipolar 1 disorder ([place])   . Depression   . Schizophrenia ([place])    History reviewed. No pertinent surgical history. Family History:  Family History  Problem Relation Age of Onset  . Stroke Mother   . Diabetes Other   . Hypertension Other   . Asthma Father   . Asthma Brother    Family Psychiatric  History:  Mom is vague on this matter  Social History:  Social History   Substance and Sexual Activity  Alcohol Use No     Social History   Substance and Sexual Activity  Drug Use No   Comment: clean of  cocaine x 3 yrs    Social History   Socioeconomic History  . Marital status: Single    Spouse name: Not on file  . Number of children: Not on file  . Years of education: Not on file  . Highest education level: Not on file  Occupational History  . Not on file  Tobacco Use  . Smoking status: Never Smoker  . Smokeless tobacco: Never Used  Substance and Sexual Activity  . Alcohol use: No  . Drug use: No    Comment: clean of cocaine x 3 yrs  . Sexual activity: Yes    Birth control/protection: None  Other Topics Concern  . Not on file  Social History Narrative  . Not on file   Social Determinants of Health   Financial Resource Strain:   . Difficulty of Paying Living Expenses:   Food Insecurity:   . Worried About Charity fundraiser in the Last Year:   . Arboriculturist in the Last Year:   Transportation Needs:   . Film/video editor (Medical):   Marland Kitchen Lack of Transportation (Non-Medical):   Physical Activity:   . Days of Exercise per Week:   . Minutes of Exercise per Session:   Stress:   . Feeling of Stress :   Social Connections:   . Frequency  of Communication with Friends and Family:   . Frequency of Social Gatherings with Friends and Family:   . Attends Religious Services:   . Active Member of Clubs or Organizations:   . Attends Archivist Meetings:   Marland Kitchen Marital Status:    Additional Social History:   Unclear if she can remain in current lliving, needs to reasses for group home status and greater support also may need ACT team status in community mental health                         Sleep:   Poor --difficult with three phases   Appetite:  Fair   Current Medications: Current Facility-Administered Medications  Medication Dose Route Frequency Provider Last Rate Last Admin  . alum & mag hydroxide-simeth (MAALOX/MYLANTA) 200-200-20 MG/5ML suspension 30 mL  30 mL Oral Q4H PRN Lang Snow, NP   30 mL at 03/30/20 0036  . benztropine  (COGENTIN) tablet 0.5 mg  0.5 mg Oral BID Eulas Post, MD      . clonazePAM Bobbye Charleston) tablet 1 mg  1 mg Oral BID Eulas Post, MD      . dextrose 50 % solution 0-50 mL  0-50 mL Intravenous PRN Carrie Mew, MD      . diphenhydrAMINE (BENADRYL) injection 25 mg  25 mg Intravenous Once Dhungel, Nishant, MD      . docusate sodium (COLACE) capsule 100 mg  100 mg Oral BID Lang Snow, NP   100 mg at 04/02/20 0848  . enoxaparin (LOVENOX) injection 40 mg  40 mg Subcutaneous Q12H Hallaji, Sheema M, RPH   40 mg at 04/02/20 0849  . fluconazole (DIFLUCAN) tablet 100 mg  100 mg Oral Daily Dhungel, Nishant, MD   100 mg at 04/02/20 0848  . FLUoxetine (PROZAC) capsule 10 mg  10 mg Oral QHS Suella Broad, FNP   10 mg at 04/01/20 2238  . haloperidol (HALDOL) tablet 5 mg  5 mg Oral QHS Eulas Post, MD      . haloperidol lactate (HALDOL) 5 MG/ML injection           . haloperidol lactate (HALDOL) injection 1 mg  1 mg Intravenous Q6H PRN Dhungel, Nishant, MD      . haloperidol lactate (HALDOL) injection 5 mg  5 mg Intravenous Once Dhungel, Nishant, MD      . insulin aspart (novoLOG) injection 0-15 Units  0-15 Units Subcutaneous TID WC Swayze, Ava, DO   5 Units at 04/02/20 1225  . insulin aspart (novoLOG) injection 0-5 Units  0-5 Units Subcutaneous QHS Lang Snow, NP   2 Units at 04/01/20 2238  . insulin aspart (novoLOG) injection 10 Units  10 Units Subcutaneous TID WC Dhungel, Nishant, MD   10 Units at 04/02/20 1225  . insulin glargine (LANTUS) injection 44 Units  44 Units Subcutaneous Daily Dhungel, Nishant, MD   44 Units at 04/02/20 0849  . insulin starter kit- pen needles (English) 1 kit  1 kit Other Once Dhungel, Nishant, MD      . lactulose (CHRONULAC) 10 GM/15ML solution 20 g  20 g Oral TID Swayze, Ava, DO   20 g at 03/31/20 1712  . lamoTRIgine (LAMICTAL) tablet 200 mg  200 mg Oral Daily Agbata, Tochukwu, MD   200 mg at 04/02/20 0848  . living well with  diabetes book MISC   Does not apply Once Dhungel, Nishant, MD      . LORazepam (ATIVAN)  injection 2 mg  2 mg Intravenous Once Dhungel, Nishant, MD      . melatonin tablet 5 mg  5 mg Oral QHS Agbata, Tochukwu, MD   5 mg at 04/01/20 2238  . nystatin cream (MYCOSTATIN)   Topical BID Swayze, Ava, DO   Given at 04/02/20 0850  . OLANZapine (ZYPREXA) injection 5 mg  5 mg Intramuscular Q6H PRN Eulas Post, MD      . promethazine (PHENERGAN) injection 12.5 mg  12.5 mg Intravenous Q6H PRN Lang Snow, NP   12.5 mg at 03/29/20 0131    Lab Results:  Results for orders placed or performed during the hospital encounter of 03/28/20 (from the past 48 hour(s))  Glucose, capillary     Status: Abnormal   Collection Time: 03/31/20  4:56 PM  Result Value Ref Range   Glucose-Capillary 261 (H) 70 - 99 mg/dL    Comment: Glucose reference range applies only to samples taken after fasting for at least 8 hours.  Glucose, capillary     Status: Abnormal   Collection Time: 03/31/20  9:16 PM  Result Value Ref Range   Glucose-Capillary 211 (H) 70 - 99 mg/dL    Comment: Glucose reference range applies only to samples taken after fasting for at least 8 hours.  Glucose, capillary     Status: Abnormal   Collection Time: 04/01/20  7:40 AM  Result Value Ref Range   Glucose-Capillary 233 (H) 70 - 99 mg/dL    Comment: Glucose reference range applies only to samples taken after fasting for at least 8 hours.  Glucose, capillary     Status: Abnormal   Collection Time: 04/01/20 11:44 AM  Result Value Ref Range   Glucose-Capillary 291 (H) 70 - 99 mg/dL    Comment: Glucose reference range applies only to samples taken after fasting for at least 8 hours.  Glucose, capillary     Status: Abnormal   Collection Time: 04/01/20  4:37 PM  Result Value Ref Range   Glucose-Capillary 171 (H) 70 - 99 mg/dL    Comment: Glucose reference range applies only to samples taken after fasting for at least 8 hours.  Glucose,  capillary     Status: Abnormal   Collection Time: 04/01/20 10:02 PM  Result Value Ref Range   Glucose-Capillary 227 (H) 70 - 99 mg/dL    Comment: Glucose reference range applies only to samples taken after fasting for at least 8 hours.   Comment 1 Notify RN   Glucose, capillary     Status: Abnormal   Collection Time: 04/02/20  7:39 AM  Result Value Ref Range   Glucose-Capillary 204 (H) 70 - 99 mg/dL    Comment: Glucose reference range applies only to samples taken after fasting for at least 8 hours.  Glucose, capillary     Status: Abnormal   Collection Time: 04/02/20 12:06 PM  Result Value Ref Range   Glucose-Capillary 230 (H) 70 - 99 mg/dL    Comment: Glucose reference range applies only to samples taken after fasting for at least 8 hours.    Blood Alcohol level:  No results found for: [facility]  Metabolic Disorder Labs: Lab Results  Component Value Date   HGBA1C 13.6 (H) 03/30/2020   MPG 343.62 03/30/2020   MPG 355 03/28/2020   No results found for: PROLACTIN No results found for: CHOL, TRIG, HDL, CHOLHDL, VLDL, LDLCALC  Physical Findings: AIMS:  , ,  ,  ,    CIWA:    COWS:  Musculoskeletal: Strength & Muscle Tone: tense due to OOC and psychotic behaviorws  Gait & Station: currently in hold  Patient leans:  n/A  Psychiatric Specialty Exam: Physical Exam  Review of Systems  Blood pressure (!) 132/92, pulse 84, temperature 97.6 F (36.4 C), temperature source Oral, resp. rate 19, height '5\' 7"'  (1.702 m), weight 136.1 kg, SpO2 98 %, unknown if currently breastfeeding.Body mass index is 46.99 kg/m.      Mental Status     Alert OOC ---oriented in general   Yelling, screaming, shouting requiring hold and prn IM meds  Hold still in progress.  Cannot assess further for now  Not clouded or fluctuant  Poor concentration attention  Judgement reliability, insight all poor  Cognition limited, fund of knowledge and intelligence all poor  Unclear safety margin                                      ADL's:  Limited at  Present   Cognition:  Limited by LD's   Sleep:   poor      Treatment Plan Summary:  OOC psychotic and manic behaviors meds adjusted pending psych transfer after medical stabilization     Eulas Post, MD 04/02/2020, 2:23 PM

## 2020-04-03 DIAGNOSIS — F209 Schizophrenia, unspecified: Secondary | ICD-10-CM

## 2020-04-03 DIAGNOSIS — Z8659 Personal history of other mental and behavioral disorders: Secondary | ICD-10-CM

## 2020-04-03 LAB — BASIC METABOLIC PANEL
Anion gap: 10 (ref 5–15)
BUN: 9 mg/dL (ref 6–20)
CO2: 27 mmol/L (ref 22–32)
Calcium: 8.5 mg/dL — ABNORMAL LOW (ref 8.9–10.3)
Chloride: 103 mmol/L (ref 98–111)
Creatinine, Ser: 0.89 mg/dL (ref 0.44–1.00)
GFR calc Af Amer: >60 mL/min (ref >60)
GFR calc non Af Amer: >60 mL/min (ref >60)
Glucose, Bld: 190 mg/dL — ABNORMAL HIGH (ref 70–99)
Potassium: 3.5 mmol/L (ref 3.5–5.1)
Sodium: 140 mmol/L (ref 135–145)

## 2020-04-03 LAB — GLUCOSE, CAPILLARY
Glucose-Capillary: 183 mg/dL — ABNORMAL HIGH (ref 70–99)
Glucose-Capillary: 290 mg/dL — ABNORMAL HIGH (ref 70–99)
Glucose-Capillary: 168 mg/dL — ABNORMAL HIGH (ref 70–99)

## 2020-04-03 MED ORDER — BENZTROPINE MESYLATE 0.5 MG PO TABS: 0.5000 mg | ORAL_TABLET | Freq: Two times a day (BID) | ORAL | Status: DC

## 2020-04-03 MED ORDER — INSULIN GLARGINE 100 UNIT/ML ~~LOC~~ SOLN: 48.0000 [IU] | Freq: Every day | SUBCUTANEOUS | 11 refills | Status: DC

## 2020-04-03 MED ORDER — INSULIN STARTER KIT- PEN NEEDLES (ENGLISH): 1.0000 | Freq: Once | 0 refills | Status: AC

## 2020-04-03 MED ORDER — INSULIN ASPART 100 UNIT/ML ~~LOC~~ SOLN: 0.0000 [IU] | Freq: Three times a day (TID) | SUBCUTANEOUS | 11 refills | Status: DC

## 2020-04-03 MED ORDER — FLUOXETINE HCL 10 MG PO CAPS: 10.0000 mg | ORAL_CAPSULE | Freq: Every day | ORAL | 3 refills | Status: DC

## 2020-04-03 MED ORDER — NYSTATIN 100000 UNIT/GM EX CREA: TOPICAL_CREAM | Freq: Two times a day (BID) | CUTANEOUS | 0 refills | Status: DC

## 2020-04-03 MED ORDER — ALUM & MAG HYDROXIDE-SIMETH 200-200-20 MG/5ML PO SUSP: 30.0000 mL | ORAL | 0 refills | Status: DC | PRN

## 2020-04-03 MED ORDER — HALOPERIDOL 5 MG PO TABS: 5.0000 mg | ORAL_TABLET | Freq: Every day | ORAL | Status: DC

## 2020-04-03 MED ORDER — LACTULOSE 10 GM/15ML PO SOLN: 20.0000 g | Freq: Three times a day (TID) | ORAL | 0 refills | Status: DC

## 2020-04-03 MED ORDER — INSULIN ASPART 100 UNIT/ML ~~LOC~~ SOLN: 10.0000 [IU] | Freq: Three times a day (TID) | SUBCUTANEOUS | 11 refills | Status: DC

## 2020-04-03 MED ORDER — DEXTROSE 50 % IV SOLN: 0.0000 mL | INTRAVENOUS | Status: DC | PRN

## 2020-04-03 MED ORDER — DOCUSATE SODIUM 100 MG PO CAPS: 100.0000 mg | ORAL_CAPSULE | Freq: Two times a day (BID) | ORAL | 0 refills | Status: DC

## 2020-04-03 MED ORDER — FLUCONAZOLE 100 MG PO TABS: 100.0000 mg | ORAL_TABLET | Freq: Every day | ORAL | 0 refills | Status: DC

## 2020-04-03 MED ORDER — INSULIN ASPART 100 UNIT/ML ~~LOC~~ SOLN: 0.0000 [IU] | Freq: Every day | SUBCUTANEOUS | 11 refills | Status: DC

## 2020-04-03 MED ORDER — CLONAZEPAM 1 MG PO TABS: 1.0000 mg | ORAL_TABLET | Freq: Two times a day (BID) | ORAL | 0 refills | Status: DC

## 2020-04-03 MED ORDER — INSULIN GLARGINE 100 UNIT/ML ~~LOC~~ SOLN
48.0000 [IU] | Freq: Every day | SUBCUTANEOUS | Status: DC
  Administered 2020-04-04: 48 [IU] via SUBCUTANEOUS
  Filled 2020-04-03: qty 0.48

## 2020-04-03 NOTE — Progress Notes (Signed)
PROGRESS NOTE    Cindy Leonard  YFV:[id number] DOB: 25-Jun-1996 DOA: 03/28/2020 PCP: Ladell Pier, MD  Brief Narrative:  24 year old morbidly obese female with history of diabetes mellitus, bipolar disorder, schizophrenia who presented with generalized weakness, polydipsia, polyuria for almost 2 weeks. For the past 3 days she reported being extremely weak and lying in the floor at home and urinating on herself. Patient has not been compliant with her prescribed medication as it does not make her feel well. Patient and her mother are unaware of prior history of diabetes.In the ED she was found to be in DKA with blood glucose of 900, sodium 127, K of 5.7, creatinine 1.65, WBC of 14 and anion gap of 24.Patient admitted for DKA. Given aggressive IV hydration and started on insulin drip.In the ED she also expressed feeling very depressed and being suicidal.   DKA  resolved.  Psychiatry following and deciding on inpatient psych admission.  On 6/1 patient became agitated wanting to go home.  Had to be involuntarily committed.  Given IV Haldol, Ativan and Benadryl followed by IM Zyprexa.  6/2: Patient seen and examined.  Mother at bedside.  No complaints.  Glycemic control improved.  Patient essentially medically ready for dispo.  Per last psychiatry note patient appropriate for Level One behavioral unit placement.  Pending psychiatry reevaluation.   Assessment & Plan:   Principal Problem:   DKA (diabetic ketoacidoses) ([place]) Active Problems:   Hx of bipolar disorder   Schizophrenia ([place])   Acne   Depression   Hyponatremia  Principal Problem: bipolar disorder with depression Schizophrenia with acute psychosis ([place]) suicidal ideation upon admission.   Safety sitter at bedside.   Psychiatry following and recommends inpatient psych admit.  Psychiatric medication regimen adjusted per Dr. Janese Banks  Awaiting bed.   Patient is medically stable for discharge.   Uncontrolled type 2  diabetes mellitus with hyperglycemia Presented with DKA that has resolved.   Patient nonadherent to medication and diet.   Plan: Increase Lantus to 48 units daily Continue premeal NovoLog 10 units 3 times daily Patient may need further titration of insulin regimen Diabetic coordinator following, recommendations appreciated Patient and mother agreed to be on insulin at discharge Metformin on hold Patient medically stable for discharge  Oral and vaginal candidiasis Symptoms improved.  Continue fluconazole for 7-Deanda course.  Acute kidney injury Prerenal secondary to dehydration.  Resolved with fluids.   DVT prophylaxis: Lovenox subcu Code Status: Full code Family Communication: Mother at bedside Disposition Plan: Status is: Inpatient  Remains inpatient appropriate because:Unsafe d/c plan   Dispo: The patient is from: Home              Anticipated d/c is to: Inpatient behavioral unit              Anticipated d/c date is: 1 [place]              Patient currently is medically stable to d/c.  Patient currently medically stable for discharge.  Pending psychiatry reevaluation of appropriateness for Level One behavioral unit placement.  Message sent to Dr. Janese Banks.  Psych follow-up appreciated.   Consultants:   Psychiatry  Procedures:   None   Antimicrobials: None   Subjective: Patient seen and examined.  Pleasant resting in bed.  No complaints  Objective: Vitals:   04/03/20 0349 04/03/20 0837 04/03/20 0900 04/03/20 1131  BP: 109/77 (!) 135/94 129/82 119/79  Pulse: 72 85 82 83  Resp: '16 18 18 18  ' Temp: 98.1  F (36.7 C) 97.9 F (36.6 C) 98.4 F (36.9 C) 98.6 F (37 C)  TempSrc: Axillary Oral Oral   SpO2: 100% 99% 100% 100%  Weight:      Height:       No intake or output data in the 24 hours ending 04/03/20 1236 Filed Weights   03/28/20 1048  Weight: 136.1 kg    Examination:  General exam: Appears calm and comfortable  Respiratory system: Clear to  auscultation. Respiratory effort normal. Cardiovascular system: S1 & S2 heard, RRR. No JVD, murmurs, rubs, gallops or clicks. No pedal edema. Gastrointestinal system: Obese, nontender, nondistended, normal bowel sounds  Central nervous system: Alert and oriented. No focal neurological deficits. Extremities: Symmetric 5 x 5 power. Skin: No rashes, lesions or ulcers Psychiatry: Judgement and insight appear normal. Mood & affect appropriate.     Data Reviewed: I have personally reviewed following labs and imaging studies  CBC: Recent Labs  Lab 03/28/20 1059 03/30/20 0447  WBC 14.7* 10.1  HGB 14.8 12.5  HCT 48.3* 40.3  MCV 86.1 84.5  PLT 373 938   Basic Metabolic Panel: Recent Labs  Lab 03/28/20 1059 03/28/20 1545 03/29/20 0704 03/29/20 1656 03/29/20 2233 03/30/20 0447 04/03/20 0549  NA 127*   < > 140 134* 135 139 140  K 5.7*   < > 3.6 4.4 3.7 3.8 3.5  CL 89*   < > 107 101 106 107 103  CO2 14*   < > 24 18* '22 23 27  ' GLUCOSE 979*   < > 173* 475* 326* 262* 190*  BUN 28*   < > 21* 25* 21* 18 9  CREATININE 1.65*   < > 1.14* 1.31* 1.02* 0.88 0.89  CALCIUM 9.3   < > 9.1 9.1 8.8* 8.5* 8.5*  MG 2.5*  --   --   --   --   --   --    < > = values in this interval not displayed.   GFR: Estimated Creatinine Clearance: 139.4 mL/min (by C-G formula based on SCr of 0.89 mg/dL). Liver Function Tests: Recent Labs  Lab 03/28/20 1059  AST 29  ALT 26  ALKPHOS 104  BILITOT 2.0*  PROT 8.6*  ALBUMIN 3.9   Recent Labs  Lab 03/28/20 1059  LIPASE 514*   No results for input(s): AMMONIA in the last 168 hours. Coagulation Profile: No results for input(s): INR, PROTIME in the last 168 hours. Cardiac Enzymes: No results for input(s): CKTOTAL, CKMB, CKMBINDEX, TROPONINI in the last 168 hours. BNP (last 3 results) No results for input(s): PROBNP in the last 8760 hours. HbA1C: No results for input(s): HGBA1C in the last 72 hours. CBG: Recent Labs  Lab 04/02/20 1206 04/02/20 1637  04/02/20 2121 04/03/20 0745 04/03/20 1128  GLUCAP 230* 230* 161* 183* 290*   Lipid Profile: No results for input(s): CHOL, HDL, LDLCALC, TRIG, CHOLHDL, LDLDIRECT in the last 72 hours. Thyroid Function Tests: No results for input(s): TSH, T4TOTAL, FREET4, T3FREE, THYROIDAB in the last 72 hours. Anemia Panel: No results for input(s): VITAMINB12, FOLATE, FERRITIN, TIBC, IRON, RETICCTPCT in the last 72 hours. Sepsis Labs: No results for input(s): PROCALCITON, LATICACIDVEN in the last 168 hours.  Recent Results (from the past 240 hour(s))  SARS Coronavirus 2 by RT PCR (hospital order, performed in [facility] hospital lab) Nasopharyngeal Nasopharyngeal Swab     Status: None   Collection Time: 03/28/20  9:56 AM   Specimen: Nasopharyngeal Swab  Result Value Ref Range Status  SARS Coronavirus 2 NEGATIVE NEGATIVE Final    Comment: (NOTE) SARS-CoV-2 target nucleic acids are NOT DETECTED. The SARS-CoV-2 RNA is generally detectable in upper and lower respiratory specimens during the acute phase of infection. The lowest concentration of SARS-CoV-2 viral copies this assay can detect is 250 copies / mL. A negative result does not preclude SARS-CoV-2 infection and should not be used as the sole basis for treatment or other patient management decisions.  A negative result may occur with improper specimen collection / handling, submission of specimen other than nasopharyngeal swab, presence of viral mutation(s) within the areas targeted by this assay, and inadequate number of viral copies (<250 copies / mL). A negative result must be combined with clinical observations, patient history, and epidemiological information. Fact Sheet for Patients:   StrictlyIdeas.no Fact Sheet for Healthcare Providers: BankingDealers.co.za This test is not yet approved or cleared  by the [place] FDA and has been authorized for detection and/or diagnosis of  SARS-CoV-2 by FDA under an Emergency Use Authorization (EUA).  This EUA will remain in effect (meaning this test can be used) for the duration of the COVID-19 declaration under Section 564(b)(1) of the Act, 21 U.S.C. section 360bbb-3(b)(1), unless the authorization is terminated or revoked sooner. Performed at [facility], [address], [place], [place] [address]          Radiology Studies: No results found.      Scheduled Meds: . benztropine  0.5 mg Oral BID  . clonazePAM  1 mg Oral BID  . docusate sodium  100 mg Oral BID  . enoxaparin (LOVENOX) injection  40 mg Subcutaneous Q12H  . fluconazole  100 mg Oral Daily  . FLUoxetine  10 mg Oral QHS  . haloperidol  5 mg Oral QHS  . haloperidol lactate  5 mg Intravenous Once  . insulin aspart  0-15 Units Subcutaneous TID WC  . insulin aspart  0-5 Units Subcutaneous QHS  . insulin aspart  10 Units Subcutaneous TID WC  . [START ON 04/04/2020] insulin glargine  48 Units Subcutaneous Daily  . insulin starter kit- pen needles  1 kit Other Once  . lactulose  20 g Oral TID  . lamoTRIgine  200 mg Oral Daily  . living well with diabetes book   Does not apply Once  . melatonin  5 mg Oral QHS  . nystatin cream   Topical BID   Continuous Infusions:   LOS: 6 days    Time spent: 35 minutes    Sidney Ace, MD [facility] Pager [phone]  If 7PM-7AM, please contact night-coverage 04/03/2020, 12:36 PM

## 2020-04-03 NOTE — BH Assessment (Signed)
Patient can come down after 9:30pm  Patient is to be admitted to [facility] BMU by Dr. Toni Amend.  Attending Physician will be. Dr. Toni Amend.   Patient has been assigned to room 311, by [facility] Charge Nurse Aurther Loft, RN.   Intake Paper Work has been signed and placed on patient chart.  Medical Floor staff is aware of the admission:  1. Jola Babinski, Diplomatic Services operational officer (Agree to relay information to MD & RN) 2. Beverly Gust, MD  3. [place] Patient's Nurse  4. [facility] Patient Access.

## 2020-04-03 NOTE — TOC Progression Note (Signed)
Transition of Care [facility]) - Progression Note    Patient Details  Name: Cindy Leonard MRN: [id number] Date of Birth: 02-04-1996  Transition of Care [facility]) CM/SW Contact  Margarito Liner, LCSW Phone Number: 04/03/2020, 4:07 PM  Clinical Narrative:  Made referral to [facility]U and [facility]. Both are unable to meet patient's needs due to psychiatric acuity. Will need to send referral to outside facilities.   Expected Discharge Plan and Services                                                 Social Determinants of Health (SDOH) Interventions    Readmission Risk Interventions No flowsheet data found.

## 2020-04-03 NOTE — Discharge Summary (Addendum)
Physician Discharge Summary  Cindy Leonard IRJ:[id number] DOB: 04-May-1996 DOA: 03/28/2020  PCP: Ladell Pier, MD  Admit date: 03/28/2020 Discharge date: 04/03/2020  Admitted From:Home Disposition:  Inpatient behavioral health unit  Recommendations for Outpatient Follow-up:  1. Follow up with PCP in 1-2 weeks   Discharge Condition:Stable CODE STATUS:FULL Diet recommendation: Heart Healthy / Carb Modified  Brief/Interim Summary: 24 year old morbidly obese female with history of diabetes mellitus, bipolar disorder, schizophrenia who presented with generalized weakness, polydipsia, polyuria for almost 2 weeks. For the past 3 days she reported being extremely weak and lying in the floor at home and urinating on herself. Patient has not been compliant with her prescribed medication as it does not make her feel well. Patient and her mother are unaware of prior history of diabetes.In the ED she was found to be in DKA with blood glucose of 900, sodium 127, K of 5.7, creatinine 1.65, WBC of 14 and anion gap of 24.Patient admitted for DKA. Given aggressive IV hydration and started on insulin drip.In the ED she also expressed feeling very depressed and being suicidal.   DKA resolved. Psychiatry following and deciding on inpatient psych admission.  On 6/1 patient became agitated wanting to go home. Had to be involuntarily committed. Given IV Haldol, Ativan and Benadryl followed by IM Zyprexa.  6/2: Patient seen and examined.  Mother at bedside.  No complaints.  Glycemic control improved.  Patient essentially medically ready for dispo.  Per last psychiatry note patient appropriate for Level One behavioral unit placement.  Pending psychiatry reevaluation.  I received notification from bedside RN that patient had been accepted to inpatient behavioral health unit at [facility].  Dr. Weber Cooks to be attending physician.  From a medicine standpoint patient is stable for discharge from acute care  hospital.  I have included her current glycemic control regimen in the discharge medication reconciliation.  Patient will be on 48 units of Lantus daily, 10 units NovoLog 3 times daily with meals, moderate sliding scale 0-15 units 3 times daily with meals, nighttime sliding scale coverage 0-5 units.  Recommend CBG monitoring before meals and at bedtime.  Also strongly recommend consultation with the inpatient diabetes coordinator service in order to effectively monitor and titrate glycemic regimen depending on need.  I have temporarily stopped the patient's home metformin which she was not compliant with anyways.  Time of discharge from behavioral unit again recommend consultation with the diabetes coordinator to ensure that patient has education and all necessary supplies as she will require insulin upon discharge home.  This has been discussed with patient and she is in agreement that she will need insulin.    On my evaluation today patient was resting comfortably in bed.  She was in no visible distress.  She was not agitated nor combative.  She understands that she will need to dispo to the inpatient psychiatric unit prior to going back home.  Patient's mother was at bedside.  All questions were answered.  Per psychiatric social worker note patient will transfer to the inpatient behavioral health unit at approximately 9:30 PM tonight.  6/3: Patient was unable to transfer to inpatient behavioral unit last night due to staffing issues.  Transferred in stable condition this morning.  Discharge Diagnoses:  Principal Problem:   DKA (diabetic ketoacidoses) ([place]) Active Problems:   Hx of bipolar disorder   Schizophrenia ([place])   Acne   Depression   Hyponatremia  Principal Problem: bipolar disorder with depression Schizophrenia with acute psychosis(HCC) suicidal ideation upon  admission.  Safety sitter at bedside.  Psychiatry following and recommends inpatient psych admit.  Psychiatric medication  regimen adjusted per Dr. Janese Banks Patient is medically stable for discharge.   Uncontrolled type 2 diabetes mellitus with hyperglycemia Presented with DKA that has resolved.  Patient nonadherent to medication and diet.  Plan: Increase Lantus to 48 units daily Continue premeal NovoLog 10 units 3 times daily Patient may need further titration of insulin regimen Diabetic coordinator following, recommendations appreciated Patient and mother agreed to be on insulin at discharge Metformin on hold Patient medically stable for discharge  Oral and vaginal candidiasis Symptoms improved. Continue fluconazole for 7-Riebe course.  Acute kidney injury Prerenal secondary to dehydration. Resolved with fluids.  Discharge Instructions  Discharge Instructions    Diet - low sodium heart healthy   Complete by: As directed    Increase activity slowly   Complete by: As directed      Allergies as of 04/03/2020      Reactions   Food Swelling   Eyes/throat swelling (blueberries)      Medication List    STOP taking these medications   adapalene 0.1 % cream Commonly known as: Differin   clindamycin 1 % external solution Commonly known as: CLEOCIN T   lurasidone 40 MG Tabs tablet Commonly known as: LATUDA   medroxyPROGESTERone 5 MG tablet Commonly known as: PROVERA   metFORMIN 500 MG tablet Commonly known as: GLUCOPHAGE     TAKE these medications   Accu-Chek Aviva Plus w/Device Kit Use as directed   accu-chek soft touch lancets Use as instructed   alum & mag hydroxide-simeth 200-200-20 MG/5ML suspension Commonly known as: MAALOX/MYLANTA Take 30 mLs by mouth every 4 (four) hours as needed for indigestion or heartburn.   benztropine 0.5 MG tablet Commonly known as: COGENTIN Take 1 tablet (0.5 mg total) by mouth 2 (two) times daily.   clonazePAM 1 MG tablet Commonly known as: KLONOPIN Take 1 tablet (1 mg total) by mouth 2 (two) times daily.   dextrose 50 % solution Inject  0-50 mLs into the vein as needed for low blood sugar.   docusate sodium 100 MG capsule Commonly known as: COLACE Take 1 capsule (100 mg total) by mouth 2 (two) times daily.   fluconazole 100 MG tablet Commonly known as: DIFLUCAN Take 1 tablet (100 mg total) by mouth daily for 7 days. Start taking on: April 04, 2020   FLUoxetine 10 MG capsule Commonly known as: PROZAC Take 1 capsule (10 mg total) by mouth at bedtime.   glucose blood test strip Commonly known as: Accu-Chek Aviva Plus Use as instructed   haloperidol 5 MG tablet Commonly known as: HALDOL Take 1 tablet (5 mg total) by mouth at bedtime.   insulin aspart 100 UNIT/ML injection Commonly known as: novoLOG Inject 0-5 Units into the skin at bedtime.   insulin aspart 100 UNIT/ML injection Commonly known as: novoLOG Inject 0-15 Units into the skin 3 (three) times daily with meals. Start taking on: April 04, 2020   insulin aspart 100 UNIT/ML injection Commonly known as: novoLOG Inject 10 Units into the skin 3 (three) times daily with meals. Start taking on: April 04, 2020   insulin glargine 100 UNIT/ML injection Commonly known as: LANTUS Inject 0.48 mLs (48 Units total) into the skin daily. Start taking on: April 04, 2020   insulin starter kit- pen needles Misc 1 kit by Other route once for 1 dose.   lactulose 10 GM/15ML solution Commonly known as: CHRONULAC Take  30 mLs (20 g total) by mouth 3 (three) times daily.   lamoTRIgine 100 MG tablet Commonly known as: LAMICTAL Take 200 mg by mouth daily.   melatonin 5 MG Tabs Take by mouth.   nystatin cream Commonly known as: MYCOSTATIN Apply topically 2 (two) times daily.       Allergies  Allergen Reactions  . Food Swelling    Eyes/throat swelling (blueberries)    Consultations:  Psychiatry   Procedures/Studies:  No results found. (Echo, Carotid, EGD, Colonoscopy, ERCP)    Subjective: Patient seen and examined on [place] of discharge.  Stable, no  distress.  Glycemic control improved.  DKA resolved.  Anion gap closed.  Patient stable for discharge to inpatient behavioral health unit.   Discharge Exam: Vitals:   04/03/20 0900 04/03/20 1131  BP: 129/82 119/79  Pulse: 82 83  Resp: 18 18  Temp: 98.4 F (36.9 C) 98.6 F (37 C)  SpO2: 100% 100%   Vitals:   04/03/20 0349 04/03/20 0837 04/03/20 0900 04/03/20 1131  BP: 109/77 (!) 135/94 129/82 119/79  Pulse: 72 85 82 83  Resp: '16 18 18 18  ' Temp: 98.1 F (36.7 C) 97.9 F (36.6 C) 98.4 F (36.9 C) 98.6 F (37 C)  TempSrc: Axillary Oral Oral   SpO2: 100% 99% 100% 100%  Weight:      Height:        General: Pt is alert, awake, not in acute distress Cardiovascular: RRR, S1/S2 +, no rubs, no gallops Respiratory: CTA bilaterally, no wheezing, no rhonchi Abdominal: Soft, NT, ND, bowel sounds + Extremities: no edema, no cyanosis    The results of significant diagnostics from this hospitalization (including imaging, microbiology, ancillary and laboratory) are listed below for reference.     Microbiology: Recent Results (from the past 240 hour(s))  SARS Coronavirus 2 by RT PCR (hospital order, performed in [facility] hospital lab) Nasopharyngeal Nasopharyngeal Swab     Status: None   Collection Time: 03/28/20  9:56 AM   Specimen: Nasopharyngeal Swab  Result Value Ref Range Status   SARS Coronavirus 2 NEGATIVE NEGATIVE Final    Comment: (NOTE) SARS-CoV-2 target nucleic acids are NOT DETECTED. The SARS-CoV-2 RNA is generally detectable in upper and lower respiratory specimens during the acute phase of infection. The lowest concentration of SARS-CoV-2 viral copies this assay can detect is 250 copies / mL. A negative result does not preclude SARS-CoV-2 infection and should not be used as the sole basis for treatment or other patient management decisions.  A negative result may occur with improper specimen collection / handling, submission of specimen other than nasopharyngeal  swab, presence of viral mutation(s) within the areas targeted by this assay, and inadequate number of viral copies (<250 copies / mL). A negative result must be combined with clinical observations, patient history, and epidemiological information. Fact Sheet for Patients:   StrictlyIdeas.no Fact Sheet for Healthcare Providers: BankingDealers.co.za This test is not yet approved or cleared  by the [place] FDA and has been authorized for detection and/or diagnosis of SARS-CoV-2 by FDA under an Emergency Use Authorization (EUA).  This EUA will remain in effect (meaning this test can be used) for the duration of the COVID-19 declaration under Section 564(b)(1) of the Act, 21 U.S.C. section 360bbb-3(b)(1), unless the authorization is terminated or revoked sooner. Performed at [facility], [address] [place], [place] [address]      Labs: BNP (last 3 results) No results for input(s): BNP in the last 8760  hours. Basic Metabolic Panel: Recent Labs  Lab 03/28/20 1059 03/28/20 1545 03/29/20 0704 03/29/20 1656 03/29/20 2233 03/30/20 0447 04/03/20 0549  NA 127*   < > 140 134* 135 139 140  K 5.7*   < > 3.6 4.4 3.7 3.8 3.5  CL 89*   < > 107 101 106 107 103  CO2 14*   < > 24 18* '22 23 27  ' GLUCOSE 979*   < > 173* 475* 326* 262* 190*  BUN 28*   < > 21* 25* 21* 18 9  CREATININE 1.65*   < > 1.14* 1.31* 1.02* 0.88 0.89  CALCIUM 9.3   < > 9.1 9.1 8.8* 8.5* 8.5*  MG 2.5*  --   --   --   --   --   --    < > = values in this interval not displayed.   Liver Function Tests: Recent Labs  Lab 03/28/20 1059  AST 29  ALT 26  ALKPHOS 104  BILITOT 2.0*  PROT 8.6*  ALBUMIN 3.9   Recent Labs  Lab 03/28/20 1059  LIPASE 514*   No results for input(s): AMMONIA in the last 168 hours. CBC: Recent Labs  Lab 03/28/20 1059 03/30/20 0447  WBC 14.7* 10.1  HGB 14.8 12.5  HCT 48.3* 40.3  MCV 86.1 84.5  PLT 373 257   Cardiac  Enzymes: No results for input(s): CKTOTAL, CKMB, CKMBINDEX, TROPONINI in the last 168 hours. BNP: Invalid input(s): POCBNP CBG: Recent Labs  Lab 04/02/20 1637 04/02/20 2121 04/03/20 0745 04/03/20 1128 04/03/20 1645  GLUCAP 230* 161* 183* 290* 168*   D-Dimer No results for input(s): DDIMER in the last 72 hours. Hgb A1c No results for input(s): HGBA1C in the last 72 hours. Lipid Profile No results for input(s): CHOL, HDL, LDLCALC, TRIG, CHOLHDL, LDLDIRECT in the last 72 hours. Thyroid function studies No results for input(s): TSH, T4TOTAL, T3FREE, THYROIDAB in the last 72 hours.  Invalid input(s): FREET3 Anemia work up No results for input(s): VITAMINB12, FOLATE, FERRITIN, TIBC, IRON, RETICCTPCT in the last 72 hours. Urinalysis    Component Value Date/Time   COLORURINE YELLOW (A) 03/28/2020 1050   APPEARANCEUR CLOUDY (A) 03/28/2020 1050   LABSPEC 1.033 (H) 03/28/2020 1050   PHURINE 5.0 03/28/2020 1050   GLUCOSEU >=500 (A) 03/28/2020 1050   HGBUR LARGE (A) 03/28/2020 1050   BILIRUBINUR NEGATIVE 03/28/2020 1050   KETONESUR 80 (A) 03/28/2020 1050   PROTEINUR NEGATIVE 03/28/2020 1050   NITRITE NEGATIVE 03/28/2020 1050   LEUKOCYTESUR MODERATE (A) 03/28/2020 1050   Sepsis Labs Invalid input(s): PROCALCITONIN,  WBC,  LACTICIDVEN Microbiology Recent Results (from the past 240 hour(s))  SARS Coronavirus 2 by RT PCR (hospital order, performed in [place] hospital lab) Nasopharyngeal Nasopharyngeal Swab     Status: None   Collection Time: 03/28/20  9:56 AM   Specimen: Nasopharyngeal Swab  Result Value Ref Range Status   SARS Coronavirus 2 NEGATIVE NEGATIVE Final    Comment: (NOTE) SARS-CoV-2 target nucleic acids are NOT DETECTED. The SARS-CoV-2 RNA is generally detectable in upper and lower respiratory specimens during the acute phase of infection. The lowest concentration of SARS-CoV-2 viral copies this assay can detect is 250 copies / mL. A negative result does not  preclude SARS-CoV-2 infection and should not be used as the sole basis for treatment or other patient management decisions.  A negative result may occur with improper specimen collection / handling, submission of specimen other than nasopharyngeal swab, presence of viral mutation(s) within  the areas targeted by this assay, and inadequate number of viral copies (<250 copies / mL). A negative result must be combined with clinical observations, patient history, and epidemiological information. Fact Sheet for Patients:   StrictlyIdeas.no Fact Sheet for Healthcare Providers: BankingDealers.co.za This test is not yet approved or cleared  by the [place] FDA and has been authorized for detection and/or diagnosis of SARS-CoV-2 by FDA under an Emergency Use Authorization (EUA).  This EUA will remain in effect (meaning this test can be used) for the duration of the COVID-19 declaration under Section 564(b)(1) of the Act, 21 U.S.C. section 360bbb-3(b)(1), unless the authorization is terminated or revoked sooner. Performed at [facility], [address], [place], [place] [address]      Time coordinating discharge: Over 30 minutes  SIGNED:   Sidney Ace, MD  [facility] 04/03/2020, 6:52 PM Pager   If 7PM-7AM, please contact night-coverage

## 2020-04-03 NOTE — BH Assessment (Signed)
Patient referred to [facility] by [facility] for consideration of a bed assignment. Upon review of available beds, [facility] does not have an appropriate bed available for Cindy Leonard. Clinician recommends patient to be referred to alternative facilities that may have bed availability. Clinician left a voicemail for LCSW-Sara Boswell 04/03/2020 @ 12:21pm with this information. If their are any questions and/or concerns please contact Melynda Ripple, Disposition Counselor at [facility] [phone].

## 2020-04-04 ENCOUNTER — Inpatient Hospital Stay
Admission: AD | Admit: 2020-04-04 | Discharge: 2020-04-08 | DRG: 885 | Disposition: A | Discharge status: Home / Self Care | Payer: Medicaid Other | Source: Intra-hospital | Attending: Psychiatry | Admitting: Psychiatry

## 2020-04-04 ENCOUNTER — Other Ambulatory Visit: Payer: Self-pay

## 2020-04-04 ENCOUNTER — Encounter: Payer: Self-pay | Admitting: Psychiatry

## 2020-04-04 DIAGNOSIS — F1290 Cannabis use, unspecified, uncomplicated: Secondary | ICD-10-CM | POA: Diagnosis present

## 2020-04-04 DIAGNOSIS — Z794 Long term (current) use of insulin: Secondary | ICD-10-CM | POA: Diagnosis not present

## 2020-04-04 DIAGNOSIS — F3110 Bipolar disorder, current episode manic without psychotic features, unspecified: Secondary | ICD-10-CM | POA: Diagnosis present

## 2020-04-04 DIAGNOSIS — E1110 Type 2 diabetes mellitus with ketoacidosis without coma: Secondary | ICD-10-CM | POA: Diagnosis present

## 2020-04-04 DIAGNOSIS — E119 Type 2 diabetes mellitus without complications: Secondary | ICD-10-CM | POA: Diagnosis present

## 2020-04-04 DIAGNOSIS — Z7289 Other problems related to lifestyle: Secondary | ICD-10-CM

## 2020-04-04 DIAGNOSIS — Z825 Family history of asthma and other chronic lower respiratory diseases: Secondary | ICD-10-CM

## 2020-04-04 DIAGNOSIS — Z8249 Family history of ischemic heart disease and other diseases of the circulatory system: Secondary | ICD-10-CM

## 2020-04-04 DIAGNOSIS — Z823 Family history of stroke: Secondary | ICD-10-CM

## 2020-04-04 DIAGNOSIS — Z833 Family history of diabetes mellitus: Secondary | ICD-10-CM

## 2020-04-04 DIAGNOSIS — R45851 Suicidal ideations: Secondary | ICD-10-CM | POA: Diagnosis present

## 2020-04-04 DIAGNOSIS — F3113 Bipolar disorder, current episode manic without psychotic features, severe: Secondary | ICD-10-CM | POA: Diagnosis not present

## 2020-04-04 LAB — GLUCOSE, CAPILLARY
Glucose-Capillary: 119 mg/dL — ABNORMAL HIGH (ref 70–99)
Glucose-Capillary: 251 mg/dL — ABNORMAL HIGH (ref 70–99)
Glucose-Capillary: 234 mg/dL — ABNORMAL HIGH (ref 70–99)

## 2020-04-04 LAB — CREATININE, SERUM
Creatinine, Ser: 1.04 mg/dL — ABNORMAL HIGH (ref 0.44–1.00)
GFR calc Af Amer: >60 mL/min (ref >60)
GFR calc non Af Amer: >60 mL/min (ref >60)

## 2020-04-04 MED ORDER — INSULIN GLARGINE 100 UNIT/ML ~~LOC~~ SOLN
48.0000 [IU] | Freq: Every day | SUBCUTANEOUS | Status: DC
  Administered 2020-04-05 – 2020-04-08 (×4): 48 [IU] via SUBCUTANEOUS
  Filled 2020-04-04 (×4): qty 0.48

## 2020-04-04 MED ORDER — DOCUSATE SODIUM 100 MG PO CAPS
100.0000 mg | ORAL_CAPSULE | Freq: Two times a day (BID) | ORAL | Status: DC
  Administered 2020-04-04 – 2020-04-07 (×6): 100 mg via ORAL
  Filled 2020-04-04 (×8): qty 1

## 2020-04-04 MED ORDER — ALUM & MAG HYDROXIDE-SIMETH 200-200-20 MG/5ML PO SUSP: 30.0000 mL | ORAL | Status: DC | PRN

## 2020-04-04 MED ORDER — INSULIN ASPART 100 UNIT/ML ~~LOC~~ SOLN
0.0000 [IU] | Freq: Three times a day (TID) | SUBCUTANEOUS | Status: DC
  Administered 2020-04-04: 8 [IU] via SUBCUTANEOUS
  Administered 2020-04-05: 2 [IU] via SUBCUTANEOUS
  Administered 2020-04-05: 5 [IU] via SUBCUTANEOUS
  Administered 2020-04-05: 3 [IU] via SUBCUTANEOUS
  Administered 2020-04-06: 2 [IU] via SUBCUTANEOUS
  Administered 2020-04-06: 5 [IU] via SUBCUTANEOUS
  Administered 2020-04-06: 8 [IU] via SUBCUTANEOUS
  Administered 2020-04-07: 3 [IU] via SUBCUTANEOUS
  Administered 2020-04-07: 5 [IU] via SUBCUTANEOUS
  Administered 2020-04-07: 3 [IU] via SUBCUTANEOUS
  Administered 2020-04-08 (×2): 5 [IU] via SUBCUTANEOUS
  Filled 2020-04-04 (×12): qty 1

## 2020-04-04 MED ORDER — LAMOTRIGINE 100 MG PO TABS
200.0000 mg | ORAL_TABLET | Freq: Every day | ORAL | Status: DC
  Administered 2020-04-04 – 2020-04-08 (×5): 200 mg via ORAL
  Filled 2020-04-04 (×5): qty 2

## 2020-04-04 MED ORDER — CLONAZEPAM 0.5 MG PO TABS
0.5000 mg | ORAL_TABLET | Freq: Two times a day (BID) | ORAL | Status: DC
  Administered 2020-04-04 – 2020-04-05 (×2): 0.5 mg via ORAL
  Filled 2020-04-04 (×2): qty 1

## 2020-04-04 MED ORDER — MELATONIN 5 MG PO TABS: 5.0000 mg | ORAL_TABLET | Freq: Every day | ORAL | Status: DC

## 2020-04-04 MED ORDER — ACETAMINOPHEN 325 MG PO TABS: 650.0000 mg | ORAL_TABLET | Freq: Four times a day (QID) | ORAL | Status: DC | PRN

## 2020-04-04 MED ORDER — HALOPERIDOL 5 MG PO TABS
5.0000 mg | ORAL_TABLET | Freq: Every day | ORAL | Status: DC
  Administered 2020-04-04 – 2020-04-07 (×4): 5 mg via ORAL
  Filled 2020-04-04 (×4): qty 1

## 2020-04-04 MED ORDER — FLUCONAZOLE 100 MG PO TABS
100.0000 mg | ORAL_TABLET | Freq: Every day | ORAL | Status: DC
  Administered 2020-04-05 – 2020-04-08 (×4): 100 mg via ORAL
  Filled 2020-04-04 (×5): qty 1

## 2020-04-04 MED ORDER — HALOPERIDOL 0.5 MG PO TABS
0.2500 mg | ORAL_TABLET | Freq: Every day | ORAL | Status: DC
  Filled 2020-04-04: qty 0.5

## 2020-04-04 MED ORDER — MAGNESIUM HYDROXIDE 400 MG/5ML PO SUSP
30.0000 mL | Freq: Every day | ORAL | Status: DC | PRN
  Administered 2020-04-06: 30 mL via ORAL
  Filled 2020-04-04: qty 30

## 2020-04-04 MED ORDER — INSULIN ASPART 100 UNIT/ML ~~LOC~~ SOLN
10.0000 [IU] | Freq: Three times a day (TID) | SUBCUTANEOUS | Status: DC
  Administered 2020-04-04 – 2020-04-06 (×6): 10 [IU] via SUBCUTANEOUS
  Filled 2020-04-04 (×5): qty 1

## 2020-04-04 MED ORDER — NYSTATIN 100000 UNIT/GM EX CREA
TOPICAL_CREAM | Freq: Two times a day (BID) | CUTANEOUS | Status: DC
  Administered 2020-04-07 (×2): 1 via TOPICAL
  Filled 2020-04-04: qty 15

## 2020-04-04 MED ORDER — OLANZAPINE 2.5 MG PO TABS
2.5000 mg | ORAL_TABLET | Freq: Every day | ORAL | Status: DC
  Filled 2020-04-04: qty 1

## 2020-04-04 MED ORDER — MELATONIN 5 MG PO TABS
5.0000 mg | ORAL_TABLET | Freq: Every day | ORAL | Status: DC
  Administered 2020-04-04 – 2020-04-07 (×4): 5 mg via ORAL
  Filled 2020-04-04 (×4): qty 1

## 2020-04-04 MED ORDER — DULOXETINE HCL 30 MG PO CPEP: 30.0000 mg | ORAL_CAPSULE | Freq: Every day | ORAL | Status: DC

## 2020-04-04 MED FILL — NovoLOG 100 UNIT/ML SOLN: 100 | 28 days supply | Qty: 10 | Fill #0

## 2020-04-04 MED FILL — LANTUS 100 UNITS/ML VIAL: 100 | 20 days supply | Qty: 10 | Fill #0

## 2020-04-04 NOTE — Progress Notes (Signed)
The patient arrived to the unit voluntarily and accompanied by security.  Belongings were secured, documents were reviewed, and a skin check was conducted.  The patient denied suicidal ideation and symptoms of psychosis.  Cindy Leonard stated that she has been depressed, but not having thoughts of self-harm.  Room 311 was provided after a brief tour of the unit.  15-minute safety checks initiated.

## 2020-04-04 NOTE — BHH Group Notes (Signed)
Balance In Life 04/04/2020 9:30AM/1PM  Type of Therapy/Topic:  Group Therapy:  Balance in Life  Participation Level:  Active  Description of Group:   This group will address the concept of balance and how it feels and looks when one is unbalanced. Patients will be encouraged to process areas in their lives that are out of balance and identify reasons for remaining unbalanced. Facilitators will guide patients in utilizing problem-solving interventions to address and correct the stressor making their life unbalanced. Understanding and applying boundaries will be explored and addressed for obtaining and maintaining a balanced life. Patients will be encouraged to explore ways to assertively make their unbalanced needs known to significant others in their lives, using other group members and facilitator for support and feedback.  Therapeutic Goals: 1. Patient will identify two or more emotions or situations they have that consume much of in their lives. 2. Patient will identify signs/triggers that life has become out of balance:  3. Patient will identify two ways to set boundaries in order to achieve balance in their lives:  4. Patient will demonstrate ability to communicate their needs through discussion and/or role plays  Summary of Patient Progress: Actively and appropriately engaged in the group. Patient was able to provide support and validation to other group members.Patient practiced active listening when interacting with the facilitator and other group members. Patient identified taking hot baths as her healthy coping methods. She reports a hot bath calms her. Pt demonstrated understanding of subject matter and respected boundaries during session.    Therapeutic Modalities:   Cognitive Behavioral Therapy Solution-Focused Therapy Assertiveness Training  [place] Philip Aspen, LCSW

## 2020-04-04 NOTE — Tx Team (Signed)
Initial Treatment Plan 04/04/2020 6:14 PM Cindy Leonard JFJ:[id number]    PATIENT STRESSORS: Health problems Occupational concerns   PATIENT STRENGTHS: Capable of independent living Communication skills General fund of knowledge   PATIENT IDENTIFIED PROBLEMS: Symptoms of Depression                     DISCHARGE CRITERIA:  Adequate post-discharge living arrangements Improved stabilization in mood, thinking, and/or behavior  PRELIMINARY DISCHARGE PLAN: Return to previous living arrangement  PATIENT/FAMILY INVOLVEMENT: This treatment plan has been presented to and reviewed with the patient, Cindy Leonard, and/or family member.  The patient and family have been given the opportunity to ask questions and make suggestions.  Virgina Organ, RN 04/04/2020, 6:14 PM

## 2020-04-04 NOTE — Progress Notes (Signed)
2200 on 6/2 called report to behavioral health unit for patient to be admitted, IV removed, patient alert and oriented and agreeable to go, the nurse  In unit, told charge nurse, the patient would have to be admitted in morning, due to them not having enough staff, charge nurse clarified with supervisor, situation was explained to patient, and was agreeable to wait this am, IVC was reinstated and patient had 1 on 1 sitter, throughout the night

## 2020-04-04 NOTE — BHH Suicide Risk Assessment (Signed)
[facility] Admission Suicide Risk Assessment   Nursing information obtained from:    Demographic factors:    Current Mental Status:    Loss Factors:    Historical Factors:    Risk Reduction Factors:     Total Time spent with patient: 1 hour Principal Problem: Bipolar affective disorder, current episode manic (HCC) Diagnosis:  Principal Problem:   Bipolar affective disorder, current episode manic (HCC) Active Problems:   DKA (diabetic ketoacidoses) (HCC)   Diabetes (HCC)  Subjective Data: Patient seen chart reviewed.  24 year old woman with a past history of behavior and mood problems who is transferred to [place] from the medical service.  Patient today denies any acute suicidal thoughts or intent.  She is not currently acting out but she tells [place] that it is very easy for her to lose her temper and get angry although she is working on being able to walk away from it.  She is not currently reporting any hallucinations and does not appear to be psychotic or disorganized.  Cooperative with treatment currently.  Continued Clinical Symptoms:    The "Alcohol Use Disorders Identification Test", Guidelines for Use in Primary Care, Second Edition.  World Science writer [facility]). Score between 0-7:  no or low risk or alcohol related problems. Score between 8-15:  moderate risk of alcohol related problems. Score between 16-19:  high risk of alcohol related problems. Score 20 or above:  warrants further diagnostic evaluation for alcohol dependence and treatment.   CLINICAL FACTORS:   Bipolar Disorder:   Mixed State   Musculoskeletal: Strength & Muscle Tone: within normal limits Gait & Station: normal Patient leans: N/A  Psychiatric Specialty Exam: Physical Exam  Nursing note and vitals reviewed. Constitutional: She appears well-developed and well-nourished.  HENT:  Head: Normocephalic and atraumatic.  Eyes: Pupils are equal, round, and reactive to light. Conjunctivae are normal.   Cardiovascular: Regular rhythm and normal heart sounds.  Respiratory: Effort normal.  GI: Soft.  Musculoskeletal:        General: Normal range of motion.     Cervical back: Normal range of motion.  Neurological: She is alert.  Skin: Skin is warm and dry.  Psychiatric: She has a normal mood and affect. Her speech is normal and behavior is normal. Thought content normal. She expresses impulsivity. She exhibits abnormal recent memory.    Review of Systems  Constitutional: Negative.   HENT: Negative.   Eyes: Negative.   Respiratory: Negative.   Cardiovascular: Negative.   Gastrointestinal: Negative.   Musculoskeletal: Negative.   Skin: Negative.   Neurological: Negative.   Psychiatric/Behavioral: Positive for behavioral problems. The patient is nervous/anxious.     unknown if currently breastfeeding.There is no height or weight on file to calculate BMI.  General Appearance: Casual  Eye Contact:  Good  Speech:  Clear and Coherent  Volume:  Normal  Mood:  Euthymic  Affect:  Congruent  Thought Process:  Goal Directed  Orientation:  Full (Time, Place, and Person)  Thought Content:  Logical  Suicidal Thoughts:  No  Homicidal Thoughts:  No  Memory:  Immediate;   Fair Recent;   Fair Remote;   Fair  Judgement:  Fair  Insight:  Shallow  Psychomotor Activity:  Normal  Concentration:  Concentration: Fair  Recall:  [employer] of Knowledge:  Fair  Language:  Fair  Akathisia:  No  Handed:  Right  AIMS (if indicated):     Assets:  Desire for Improvement Housing Social Support  ADL's:  Impaired  Cognition:  Impaired,  Mild  Sleep:         COGNITIVE FEATURES THAT CONTRIBUTE TO RISK:  Polarized thinking    SUICIDE RISK:   Minimal: No identifiable suicidal ideation.  Patients presenting with no risk factors but with morbid ruminations; may be classified as minimal risk based on the severity of the depressive symptoms  PLAN OF CARE: Maintain every 15 precautions.  Review  medications and make adjustments as needed.  Engage when possible in individual and group therapy.  Assess for stability and outpatient treatment arrangements.  I certify that inpatient services furnished can reasonably be expected to improve the patient's condition.   Alethia Berthold, MD 04/04/2020, 3:52 PM
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
## 2020-04-04 NOTE — Progress Notes (Signed)
Report received from RN on Inpatient Med-Surg.  Patient assigned to Room 311.

## 2020-04-04 NOTE — Progress Notes (Signed)
Recreation Therapy Notes  INPATIENT RECREATION THERAPY ASSESSMENT  Patient Details Name: Abrea Henle Laine MRN: [id number] DOB: [birth year] Today's Date: 04/04/2020       Information Obtained From: Patient  Able to Participate in Assessment/Interview: Yes  Patient Presentation: Responsive  Reason for Admission (Per Patient): Active Symptoms, Suicidal Ideation  Patient Stressors:    Coping Skills:   Write, Music, Merchandiser, retail  Leisure Interests (2+):  Art - Mudlogger, Games - Tax inspector)  Frequency of Recreation/Participation:    Awareness of Community Resources:     [employer]:     Current Use:    If no, Barriers?:    Expressed Interest in [employer] Information:    [employer] of Residence:  Film/video editor  Patient Main Form of Transportation: Uber/Lyft  Patient Strengths:  Listening, sense of humor  Patient Identified Areas of Improvement:  Learn to take criticism  Patient Goal for Hospitalization:  Understand self-care better  Current SI (including self-harm):  No  Current HI:  No  Current AVH: No  Staff Intervention Plan: Collaborate with Interdisciplinary Treatment Team, Group Attendance  Consent to Intern Participation: N/A  Perrion Diesel 04/04/2020, 3:30 PM

## 2020-04-04 NOTE — Plan of Care (Signed)
Patient new to the unit today, hasn't had time to progress  Problem: Education: Goal: Knowledge of [facility] Education information/materials will improve Outcome: Not Progressing Goal: Emotional status will improve Outcome: Not Progressing Goal: Mental status will improve Outcome: Not Progressing Goal: Verbalization of understanding the information provided will improve Outcome: Not Progressing   Problem: Activity: Goal: Interest or engagement in activities will improve Outcome: Not Progressing Goal: Sleeping patterns will improve Outcome: Not Progressing   Problem: Coping: Goal: Ability to verbalize frustrations and anger appropriately will improve Outcome: Not Progressing Goal: Ability to demonstrate self-control will improve Outcome: Not Progressing   Problem: Physical Regulation: Goal: Ability to maintain clinical measurements within normal limits will improve Outcome: Not Progressing   Problem: Safety: Goal: Periods of time without injury will increase Outcome: Not Progressing

## 2020-04-04 NOTE — Progress Notes (Signed)
Patient pleasant during assessment denying SI/HI/AVH and pain. Patient endorses depression. Patient compliant with medication administration per MD orders. Patient given education, support and encouragement to be active in her treatment plan. Patient observed by this Clinical research associate interacting appropriately with staff and peers on the unit. Patient being monitored Q 15 minutes for safety per unit protocol. Patient remains safe on the unit.

## 2020-04-04 NOTE — Progress Notes (Signed)
BRIEF PHARMACY NOTE   This patient attended and participated in Medication Management Group counseling led by [facility] staff pharmacist.  This interactive class reviews basic information about prescription medications and education on personal responsibility in medication management.  The class also includes general knowledge of 3 main classes of behavioral medications, including antipsychotics, antidepressants, and mood stabilizers.     Patient behavior was appropriate for group setting.   Educational materials sourced from:  "Medication Do's and Don'ts" from [email or web address]   "Mental Health Medications" from [facility] FaxRack.tn.shtml#part [id number]    Albina Billet, PharmD, BCPS Clinical Pharmacist 04/04/2020 2:58 PM

## 2020-04-04 NOTE — Clinical Social Work Note (Signed)
Patient has orders to discharge to [facility] BMU. No further concerns.  CSW signing off.  Charlynn Court, CSW [phone]

## 2020-04-05 LAB — GLUCOSE, CAPILLARY
Glucose-Capillary: 185 mg/dL — ABNORMAL HIGH (ref 70–99)
Glucose-Capillary: 207 mg/dL — ABNORMAL HIGH (ref 70–99)
Glucose-Capillary: 147 mg/dL — ABNORMAL HIGH (ref 70–99)

## 2020-04-05 MED ORDER — CLONAZEPAM 0.25 MG PO TBDP
0.2500 mg | ORAL_TABLET | Freq: Two times a day (BID) | ORAL | Status: DC
  Administered 2020-04-05 – 2020-04-08 (×6): 0.25 mg via ORAL
  Filled 2020-04-05 (×6): qty 1

## 2020-04-05 NOTE — Plan of Care (Signed)
Data: Patient is appropriate and cooperative to assessment. Patient denies SI/HI/AVH. Patient has not completed daily self inventory worksheet. Patient reports anxiety this morning. Patient has no somatic complaints and a pain rating of 0/10. Patient rates depression "6/10" , feelings of hopelessness "2/10" and anxiety "8/10" Patients goal for today is "get my medication figured out."   Action:  Q x 15 minute observation checks were completed for safety. Patient was provided with education on medications. Patient was offered support and encouragement. Patient was given scheduled medications. Patient  was encourage to attend groups, participate in unit activities and continue with plan of care.    Response: Patient is adherent with scheduled medication. Patient has no complaints at this time. Patient is receptive to treatment and safety maintained on unit.    Problem: Education: Goal: Knowledge of [facility] Education information/materials will improve Outcome: Not Progressing Goal: Emotional status will improve Outcome: Not Progressing Goal: Mental status will improve Outcome: Not Progressing Goal: Verbalization of understanding the information provided will improve Outcome: Not Progressing

## 2020-04-05 NOTE — [facility] Suicide Risk Assessment (Signed)
[facility] INPATIENT:  Family/Significant Other Suicide Prevention Education  Suicide Prevention Education:  Education Completed; Cindy Leonard, mother [phone] has been identified by the patient as the family member/significant other with whom the patient will be residing, and identified as the person(s) who will aid the patient in the event of a mental health crisis (suicidal ideations/suicide attempt).  With written consent from the patient, the family member/significant other has been provided the following suicide prevention education, prior to the and/or following the discharge of the patient.  The suicide prevention education provided includes the following:  Suicide risk factors  Suicide prevention and interventions  National Suicide Hotline telephone number  [facility] assessment telephone number  [facility] Emergency Assistance 911  [facility] and/or Residential Mobile Crisis Unit telephone number  Request made of family/significant other to:  Remove weapons (e.g., guns, rifles, knives), all items previously/currently identified as safety concern.    Remove drugs/medications (over-the-counter, prescriptions, illicit drugs), all items previously/currently identified as a safety concern.  The family member/significant other verbalizes understanding of the suicide prevention education information provided.  The family member/significant other agrees to remove the items of safety concern listed above. Cindy Leonard reports the pt was brought to the hospital because "her sugar went up and she was disoriented." She also reports the pt made comments about wanting to harm herself. Cindy Leonard says she lives in government housing and raises concern if she allows the pt to temporarily live with her that she will display behavioral issue and cause property damage to the home. She reports the pt is no longer receiving her disability check. She states there are no other relatives who  are willing to temporarily house the pt. She denies the pt having access to guns or weapons.  Cindy Leonard Cindy Leonard 04/05/2020, 11:32 AM

## 2020-04-05 NOTE — [facility] Counselor (Signed)
CSW met with pt to discuss discharge plan.CSW discussed with the pt referral for residential tx. The pt states she would like to take the weekend to think about being referred for residential tx. Pt also states she will consider referral for outpatient treatment.  
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
## 2020-04-05 NOTE — Progress Notes (Signed)
Recreation Therapy Notes  Date: 04/05/2020  Time: 9:30 am  Location: Craft room  Behavioral response: Appropriate  Intervention Topic: Emotions   Discussion/Intervention:  Group content on today was focused on emotions. The group identified what emotions are and why it is important to have emotions. Patients expressed some positive and negative emotions. Individuals gave some past experiences on how they normally dealt with emotions in the past. The group described some positive ways to deal with emotions in the future. Patients participated in the intervention "Name the Megan Salon" where individuals were given a chance to experience different emotions.  Clinical Observations/Feedback:  Patient came to group and defined emotions as signs of anger and sadness. She expressed that emotions come from pain and family. Participant stated that common emotions for her is anger and happiness. Patient explained that emotions are important for your self-care. Individual was social with peers and staff while participating on the intervention.  Greogory Cornette LRT/CTRS         Ruthene Methvin 04/05/2020 1:43 PM

## 2020-04-05 NOTE — Progress Notes (Signed)
MHT engaged in nightly group greeting and introducing self to patient. MHT went over the rules for the unit, provided snack, and went over the remaining routine for the night. MHT interacted with patient asking what a good coping skill is that is used daily and a good goal patient is working on meeting. Patient responded that her coping skills is to color or vent when she becomes triggered. Patient stated that her goal is to leave and to find a structured group home in order to have support and maintenance for her medication.

## 2020-04-05 NOTE — [facility] Group Notes (Signed)
LCSW Group Therapy Note  04/05/2020 2:52 PM  Type of Therapy and Topic:  Group Therapy:  Feelings around Relapse and Recovery  Participation Level:  Active   Description of Group:    Patients in this group will discuss emotions they experience before and after a relapse. They will process how experiencing these feelings, or avoidance of experiencing them, relates to having a relapse. Facilitator will guide patients to explore emotions they have related to recovery. Patients will be encouraged to process which emotions are more powerful. They will be guided to discuss the emotional reaction significant others in their lives may have to their relapse or recovery. Patients will be assisted in exploring ways to respond to the emotions of others without this contributing to a relapse.  Therapeutic Goals: 1. Patient will identify two or more emotions that lead to a relapse for them 2. Patient will identify two emotions that result when they relapse 3. Patient will identify two emotions related to recovery 4. Patient will demonstrate ability to communicate their needs through discussion and/or role plays   Summary of Patient Progress: Patient was present in group.  Patient shared that she has a good understanding of her triggers. Patient shared how she has experienced being fired and had to learn to adjust to that.  She reports that she has struggled with going out with peers and limiting herself to one drink and that it has become a slippery slope in the past that eventually led to her having more drinks.    Therapeutic Modalities:   Cognitive Behavioral Therapy Solution-Focused Therapy Assertiveness Training Relapse Prevention Therapy   Penni Homans, MSW, LCSW 04/05/2020 2:52 PM

## 2020-04-06 LAB — GLUCOSE, CAPILLARY
Glucose-Capillary: 228 mg/dL — ABNORMAL HIGH (ref 70–99)
Glucose-Capillary: 124 mg/dL — ABNORMAL HIGH (ref 70–99)

## 2020-04-06 MED ORDER — INSULIN ASPART 100 UNIT/ML ~~LOC~~ SOLN
12.0000 [IU] | Freq: Three times a day (TID) | SUBCUTANEOUS | Status: DC
  Administered 2020-04-06 – 2020-04-08 (×6): 12 [IU] via SUBCUTANEOUS
  Filled 2020-04-06 (×6): qty 1

## 2020-04-06 NOTE — Progress Notes (Signed)
P - Monitor Mood  D - The patient was pleasant, cooperative, and behaviorally appropriate.  She accepted her medications as ordered and denied thoughts of harming herself.  Merriam consumed adequate food and fluids.  Mood was stable.  No significant changes or issues to note.    A - Medications and emotional support provided.  BG Levels monitored and insulin administered with teaching.    R - Continue with care.

## 2020-04-06 NOTE — Tx Team (Signed)
Interdisciplinary Treatment and Diagnostic Plan Update  04/06/2020 Time of Session: 10:45am Cindy Leonard MRN: [id number]  Principal Diagnosis: Bipolar affective disorder, current episode manic ([place])  Secondary Diagnoses: Principal Problem:   Bipolar affective disorder, current episode manic ([place]) Active Problems:   DKA (diabetic ketoacidoses) ([place])   Diabetes ([place])   Current Medications:  Current Facility-Administered Medications  Medication Dose Route Frequency Provider Last Rate Last Admin  . acetaminophen (TYLENOL) tablet 650 mg  650 mg Oral Q6H PRN Clapacs, John T, MD      . alum & mag hydroxide-simeth (MAALOX/MYLANTA) 200-200-20 MG/5ML suspension 30 mL  30 mL Oral Q4H PRN Clapacs, John T, MD      . clonazePAM (KLONOPIN) disintegrating tablet 0.25 mg  0.25 mg Oral BID Clapacs, Madie Reno, MD   0.25 mg at 04/06/20 0748  . docusate sodium (COLACE) capsule 100 mg  100 mg Oral BID Clapacs, Madie Reno, MD   100 mg at 04/06/20 0748  . fluconazole (DIFLUCAN) tablet 100 mg  100 mg Oral Daily Clapacs, John T, MD   100 mg at 04/06/20 0748  . haloperidol (HALDOL) tablet 5 mg  5 mg Oral QHS Clapacs, Madie Reno, MD   5 mg at 04/05/20 2145  . insulin aspart (novoLOG) injection 0-15 Units  0-15 Units Subcutaneous TID WC Clapacs, Madie Reno, MD   5 Units at 04/06/20 0759  . insulin aspart (novoLOG) injection 10 Units  10 Units Subcutaneous TID WC Clapacs, Madie Reno, MD   10 Units at 04/06/20 0759  . insulin glargine (LANTUS) injection 48 Units  48 Units Subcutaneous Daily Clapacs, Madie Reno, MD   48 Units at 04/06/20 [phone]  . lamoTRIgine (LAMICTAL) tablet 200 mg  200 mg Oral Daily Clapacs, Madie Reno, MD   200 mg at 04/06/20 0748  . magnesium hydroxide (MILK OF MAGNESIA) suspension 30 mL  30 mL Oral Daily PRN Clapacs, John T, MD      . melatonin tablet 5 mg  5 mg Oral QHS Caroline Sauger, NP   5 mg at 04/05/20 2145  . nystatin cream (MYCOSTATIN)   Topical BID Caroline Sauger, NP   Given at 04/04/20 2223   PTA  Medications: Medications Prior to Admission  Medication Sig Dispense Refill Last Dose  . alum & mag hydroxide-simeth (MAALOX/MYLANTA) 200-200-20 MG/5ML suspension Take 30 mLs by mouth every 4 (four) hours as needed for indigestion or heartburn. 355 mL 0   . benztropine (COGENTIN) 0.5 MG tablet Take 1 tablet (0.5 mg total) by mouth 2 (two) times daily.     . Blood Glucose Monitoring Suppl (ACCU-CHEK AVIVA PLUS) w/Device KIT Use as directed 1 kit 0   . clonazePAM (KLONOPIN) 1 MG tablet Take 1 tablet (1 mg total) by mouth 2 (two) times daily. 30 tablet 0   . dextrose 50 % solution Inject 0-50 mLs into the vein as needed for low blood sugar. 50 mL    . docusate sodium (COLACE) 100 MG capsule Take 1 capsule (100 mg total) by mouth 2 (two) times daily. 10 capsule 0   . fluconazole (DIFLUCAN) 100 MG tablet Take 1 tablet (100 mg total) by mouth daily for 7 days. 7 tablet 0   . FLUoxetine (PROZAC) 10 MG capsule Take 1 capsule (10 mg total) by mouth at bedtime.  3   . glucose blood (ACCU-CHEK AVIVA PLUS) test strip Use as instructed 100 each 12   . haloperidol (HALDOL) 5 MG tablet Take 1 tablet (5 mg total) by mouth  at bedtime.     . insulin aspart (NOVOLOG) 100 UNIT/ML injection Inject 0-15 Units into the skin 3 (three) times daily with meals. 10 mL 11   . insulin aspart (NOVOLOG) 100 UNIT/ML injection Inject 0-5 Units into the skin at bedtime. 10 mL 11   . insulin aspart (NOVOLOG) 100 UNIT/ML injection Inject 10 Units into the skin 3 (three) times daily with meals. 10 mL 11   . insulin glargine (LANTUS) 100 UNIT/ML injection Inject 0.48 mLs (48 Units total) into the skin daily. 10 mL 11   . lactulose (CHRONULAC) 10 GM/15ML solution Take 30 mLs (20 g total) by mouth 3 (three) times daily. 236 mL 0   . lamoTRIgine (LAMICTAL) 100 MG tablet Take 200 mg by mouth daily.     . Lancets (ACCU-CHEK SOFT TOUCH) lancets Use as instructed 100 each 12   . Melatonin 5 MG TABS Take by mouth.     . nystatin cream  (MYCOSTATIN) Apply topically 2 (two) times daily. 30 g 0     Patient Stressors: Health problems Occupational concerns  Patient Strengths: Capable of independent living Curator fund of knowledge  Treatment Modalities: Medication Management, Group therapy, Case management,  1 to 1 session with clinician, Psychoeducation, Recreational therapy.   Physician Treatment Plan for Primary Diagnosis: Bipolar affective disorder, current episode manic ([place]) Long Term Goal(s): Improvement in symptoms so as ready for discharge Improvement in symptoms so as ready for discharge   Short Term Goals: Ability to disclose and discuss suicidal ideas Ability to demonstrate self-control will improve Ability to maintain clinical measurements within normal limits will improve Compliance with prescribed medications will improve  Medication Management: Evaluate patient's response, side effects, and tolerance of medication regimen.  Therapeutic Interventions: 1 to 1 sessions, Unit Group sessions and Medication administration.  Evaluation of Outcomes: Progressing  Physician Treatment Plan for Secondary Diagnosis: Principal Problem:   Bipolar affective disorder, current episode manic ([place]) Active Problems:   DKA (diabetic ketoacidoses) ([place])   Diabetes ([place])  Long Term Goal(s): Improvement in symptoms so as ready for discharge Improvement in symptoms so as ready for discharge   Short Term Goals: Ability to disclose and discuss suicidal ideas Ability to demonstrate self-control will improve Ability to maintain clinical measurements within normal limits will improve Compliance with prescribed medications will improve     Medication Management: Evaluate patient's response, side effects, and tolerance of medication regimen.  Therapeutic Interventions: 1 to 1 sessions, Unit Group sessions and Medication administration.  Evaluation of Outcomes: Progressing   RN Treatment Plan for  Primary Diagnosis: Bipolar affective disorder, current episode manic ([place]) Long Term Goal(s): Knowledge of disease and therapeutic regimen to maintain health will improve  Short Term Goals: Ability to participate in decision making will improve, Ability to verbalize feelings will improve, Ability to disclose and discuss suicidal ideas, Ability to identify and develop effective coping behaviors will improve and Compliance with prescribed medications will improve  Medication Management: RN will administer medications as ordered by provider, will assess and evaluate patient's response and provide education to patient for prescribed medication. RN will report any adverse and/or side effects to prescribing provider.  Therapeutic Interventions: 1 on 1 counseling sessions, Psychoeducation, Medication administration, Evaluate responses to treatment, Monitor vital signs and CBGs as ordered, Perform/monitor CIWA, COWS, AIMS and Fall Risk screenings as ordered, Perform wound care treatments as ordered.  Evaluation of Outcomes: Progressing   LCSW Treatment Plan for Primary Diagnosis: Bipolar affective disorder, current episode manic ([place])  Long Term Goal(s): Safe transition to appropriate next level of care at discharge, Engage patient in therapeutic group addressing interpersonal concerns.  Short Term Goals: Engage patient in aftercare planning with referrals and resources and Increase skills for wellness and recovery  Therapeutic Interventions: Assess for all discharge needs, 1 to 1 time with Social worker, Explore available resources and support systems, Assess for adequacy in community support network, Educate family and significant other(s) on suicide prevention, Complete Psychosocial Assessment, Interpersonal group therapy.  Evaluation of Outcomes: Progressing   Progress in Treatment: Attending groups: Yes. Participating in groups: Yes. Taking medication as prescribed: Yes. Toleration medication:  Yes. Family/Significant other contact made: Yes, individual(s) contacted:  pt's mother Patient understands diagnosis: Yes. Discussing patient identified problems/goals with staff: Yes. Medical problems stabilized or resolved: Yes. Denies suicidal/homicidal ideation: Yes. Issues/concerns per patient self-inventory: No. Other:   New problem(s) identified: No, Describe:  None  New Short Term/Long Term Goal(s): Medication stabilization, elimination of SI thoughts, and development of a comprehensive mental wellness plan.   Patient Goals: "Understand the triggers and how to deal with the triggers"  Discharge Plan or Barriers: Patient should be discharging on Monday (6/7) and going home with her mother. Per patient, going home with her mother was worked out and mom is going to help her get back her disability.   Reason for Continuation of Hospitalization: Medication stabilization  Estimated Length of Stay: 1-2 days   Attendees: Patient: Cindy Leonard  04/06/2020 10:49 AM  Physician: Dr. Alethia Berthold 04/06/2020 10:49 AM  Nursing: Javier Glazier, RN  04/06/2020 10:49 AM  RN Care Manager: 04/06/2020 10:49 AM  Social Worker: Ardelle Anton, LCSW  04/06/2020 10:49 AM  Recreational Therapist:  04/06/2020 10:49 AM  Other:  04/06/2020 10:49 AM  Other:  04/06/2020 10:49 AM  Other: 04/06/2020 10:49 AM    Scribe for Treatment Team: Trecia Rogers, LCSW 04/06/2020 10:49 AM

## 2020-04-06 NOTE — Progress Notes (Signed)
[facility] MD Progress Note  04/06/2020 12:17 PM Cindy Leonard  MRN:  [id number] Subjective: Follow-up for this patient with bipolar disorder mood instability new onset diabetes.  Patient seen and also attended treatment team.  She feels like she is achieving her goals getting her mood under control and learning how to have better coping skills.  She is not reporting any current suicidal or homicidal ideation.  She has not been showing out or acting badly on the unit.  Review of her blood sugars show that she is still running a little too high throughout the [place]. Principal Problem: Bipolar affective disorder, current episode manic ([facility]) Diagnosis: Principal Problem:   Bipolar affective disorder, current episode manic ([facility]) Active Problems:   DKA (diabetic ketoacidoses) ([facility])   Diabetes ([facility])  Total Time spent with patient: 30 minutes  Past Psychiatric History: Past history of chronic mood and behavior problems  Past Medical History:  Past Medical History:  Diagnosis Date  . Bipolar 1 disorder ([facility])   . Depression   . Schizophrenia ([facility])    History reviewed. No pertinent surgical history. Family History:  Family History  Problem Relation Age of Onset  . Stroke Mother   . Diabetes Other   . Hypertension Other   . Asthma Father   . Asthma Brother    Family Psychiatric  History: See previous Social History:  Social History   Substance and Sexual Activity  Alcohol Use No     Social History   Substance and Sexual Activity  Drug Use No   Comment: clean of cocaine x 3 yrs    Social History   Socioeconomic History  . Marital status: Single    Spouse name: Not on file  . Number of children: Not on file  . Years of education: Not on file  . Highest education level: Not on file  Occupational History  . Not on file  Tobacco Use  . Smoking status: Never Smoker  . Smokeless tobacco: Never Used  Substance and Sexual Activity  . Alcohol use: No  . Drug use: No    Comment: clean  of cocaine x 3 yrs  . Sexual activity: Yes    Birth control/protection: None  Other Topics Concern  . Not on file  Social History Narrative  . Not on file   Social Determinants of Health   Financial Resource Strain:   . Difficulty of Paying Living Expenses:   Food Insecurity:   . Worried About Programme researcher, broadcasting/film/video in the Last Year:   . Barista in the Last Year:   Transportation Needs:   . Freight forwarder (Medical):   Marland Kitchen Lack of Transportation (Non-Medical):   Physical Activity:   . Days of Exercise per Week:   . Minutes of Exercise per Session:   Stress:   . Feeling of Stress :   Social Connections:   . Frequency of Communication with Friends and Family:   . Frequency of Social Gatherings with Friends and Family:   . Attends Religious Services:   . Active Member of Clubs or Organizations:   . Attends Banker Meetings:   Marland Kitchen Marital Status:    Additional Social History:                         Sleep: Fair  Appetite:  Fair  Current Medications: Current Facility-Administered Medications  Medication Dose Route Frequency Provider Last Rate Last Admin  . acetaminophen (  TYLENOL) tablet 650 mg  650 mg Oral Q6H PRN Tiffany Calmes T, MD      . alum & mag hydroxide-simeth (MAALOX/MYLANTA) 200-200-20 MG/5ML suspension 30 mL  30 mL Oral Q4H PRN Jere Bostrom T, MD      . clonazePAM (KLONOPIN) disintegrating tablet 0.25 mg  0.25 mg Oral BID Janaisha Tolsma, Madie Reno, MD   0.25 mg at 04/06/20 0748  . docusate sodium (COLACE) capsule 100 mg  100 mg Oral BID Tashiya Souders, Madie Reno, MD   100 mg at 04/06/20 0748  . fluconazole (DIFLUCAN) tablet 100 mg  100 mg Oral Daily Marguerite Jarboe T, MD   100 mg at 04/06/20 0748  . haloperidol (HALDOL) tablet 5 mg  5 mg Oral QHS Njeri Vicente, Madie Reno, MD   5 mg at 04/05/20 2145  . insulin aspart (novoLOG) injection 0-15 Units  0-15 Units Subcutaneous TID WC Delaina Fetsch, Madie Reno, MD   8 Units at 04/06/20 1128  . insulin aspart (novoLOG) injection  12 Units  12 Units Subcutaneous TID WC Layonna Dobie T, MD      . insulin glargine (LANTUS) injection 48 Units  48 Units Subcutaneous Daily Sorina Derrig, Madie Reno, MD   48 Units at 04/06/20 0936  . lamoTRIgine (LAMICTAL) tablet 200 mg  200 mg Oral Daily Cristel Rail, Madie Reno, MD   200 mg at 04/06/20 0748  . magnesium hydroxide (MILK OF MAGNESIA) suspension 30 mL  30 mL Oral Daily PRN Elliana Bal T, MD      . melatonin tablet 5 mg  5 mg Oral QHS Caroline Sauger, NP   5 mg at 04/05/20 2145  . nystatin cream (MYCOSTATIN)   Topical BID Caroline Sauger, NP   Given at 04/04/20 2223    Lab Results:  Results for orders placed or performed during the hospital encounter of 04/04/20 (from the past 48 hour(s))  Glucose, capillary     Status: Abnormal   Collection Time: 04/04/20  4:37 PM  Result Value Ref Range   Glucose-Capillary 234 (H) 70 - 99 mg/dL    Comment: Glucose reference range applies only to samples taken after fasting for at least 8 hours.   Comment 1 Notify RN   Glucose, capillary     Status: Abnormal   Collection Time: 04/05/20  7:05 AM  Result Value Ref Range   Glucose-Capillary 185 (H) 70 - 99 mg/dL    Comment: Glucose reference range applies only to samples taken after fasting for at least 8 hours.   Comment 1 Notify RN   Glucose, capillary     Status: Abnormal   Collection Time: 04/05/20 10:35 AM  Result Value Ref Range   Glucose-Capillary 207 (H) 70 - 99 mg/dL    Comment: Glucose reference range applies only to samples taken after fasting for at least 8 hours.   Comment 1 Notify RN   Glucose, capillary     Status: Abnormal   Collection Time: 04/05/20  4:12 PM  Result Value Ref Range   Glucose-Capillary 147 (H) 70 - 99 mg/dL    Comment: Glucose reference range applies only to samples taken after fasting for at least 8 hours.   Comment 1 Notify RN   Glucose, capillary     Status: Abnormal   Collection Time: 04/06/20 11:24 AM  Result Value Ref Range   Glucose-Capillary 228 (H)  70 - 99 mg/dL    Comment: Glucose reference range applies only to samples taken after fasting for at least 8 hours.    Blood  Alcohol level:  No results found for: [employer]  Metabolic Disorder Labs: Lab Results  Component Value Date   HGBA1C 13.6 (H) 03/30/2020   MPG 343.62 03/30/2020   MPG 355 03/28/2020   No results found for: PROLACTIN No results found for: CHOL, TRIG, HDL, CHOLHDL, VLDL, LDLCALC  Physical Findings: AIMS:  , ,  ,  ,    CIWA:    COWS:     Musculoskeletal: Strength & Muscle Tone: within normal limits Gait & Station: normal Patient leans: N/A  Psychiatric Specialty Exam: Physical Exam  Nursing note and vitals reviewed. Constitutional: She appears well-developed and well-nourished.  HENT:  Head: Normocephalic and atraumatic.  Eyes: Pupils are equal, round, and reactive to light. Conjunctivae are normal.  Cardiovascular: Regular rhythm and normal heart sounds.  Respiratory: Effort normal. No respiratory distress.  GI: Soft.  Musculoskeletal:        General: Normal range of motion.     Cervical back: Normal range of motion.  Neurological: She is alert.  Skin: Skin is warm and dry.  Psychiatric: She has a normal mood and affect. Her behavior is normal. Judgment and thought content normal.    Review of Systems  Constitutional: Negative.   HENT: Negative.   Eyes: Negative.   Respiratory: Negative.   Cardiovascular: Negative.   Gastrointestinal: Negative.   Musculoskeletal: Negative.   Skin: Negative.   Neurological: Negative.   Psychiatric/Behavioral: Negative.     Blood pressure 130/88, pulse 81, temperature 98.3 F (36.8 C), temperature source Oral, resp. rate 16, height 5\' 7"  (1.702 m), weight 136 kg, SpO2 98 %, unknown if currently breastfeeding.Body mass index is 46.96 kg/m.  General Appearance: Casual  Eye Contact:  Good  Speech:  Clear and Coherent  Volume:  Normal  Mood:  Euthymic  Affect:  Congruent  Thought Process:  Goal Directed   Orientation:  Full (Time, Place, and Person)  Thought Content:  Logical  Suicidal Thoughts:  No  Homicidal Thoughts:  No  Memory:  Immediate;   Fair Recent;   Fair Remote;   Fair  Judgement:  Fair  Insight:  Fair  Psychomotor Activity:  Normal  Concentration:  Concentration: Fair  Recall:  of Knowledge:  Fair  Language:  Fair  Akathisia:  No  Handed:  Right  AIMS (if indicated):     Assets:  Desire for Improvement Resilience Social Support  ADL's:  Intact  Cognition:  WNL  Sleep:  Number of Hours: 6.25     Treatment Plan Summary: Daily contact with patient to assess and evaluate symptoms and progress in treatment, Medication management and Plan Increase the mealtime insulin to 12 units.  No change to psychiatric medicine.  Anticipate likely discharge Monday.  I was told yesterday by social work that the patient would not be able to go stay with her mother because of her mother's public housing situation but the patient now tells me that she is certain that she has "worked it out" with her mother and will be able to stay there.  Sunday, MD 04/06/2020, 12:17 PM

## 2020-04-06 NOTE — Plan of Care (Signed)
  Problem: Education: Goal: Knowledge of [place] [facility] Education information/materials will improve Outcome: Progressing Goal: Emotional status will improve Outcome: Progressing Goal: Mental status will improve Outcome: Progressing Goal: Verbalization of understanding the information provided will improve Outcome: Progressing   Problem: Activity: Goal: Interest or engagement in activities will improve Outcome: Progressing Goal: Sleeping patterns will improve Outcome: Progressing   Problem: Coping: Goal: Ability to verbalize frustrations and anger appropriately will improve Outcome: Progressing Goal: Ability to demonstrate self-control will improve Outcome: Progressing   Problem: Health Behavior/Discharge Planning: Goal: Identification of resources available to assist in meeting health care needs will improve Outcome: Progressing Goal: Compliance with treatment plan for underlying cause of condition will improve Outcome: Progressing   Problem: Physical Regulation: Goal: Ability to maintain clinical measurements within normal limits will improve Outcome: Progressing   Problem: Safety: Goal: Periods of time without injury will increase Outcome: Progressing   Problem: Education: Goal: Utilization of techniques to improve thought processes will improve Outcome: Progressing Goal: Knowledge of the prescribed therapeutic regimen will improve Outcome: Progressing   Problem: Activity: Goal: Interest or engagement in leisure activities will improve Outcome: Progressing Goal: Imbalance in normal sleep/wake cycle will improve Outcome: Progressing   Problem: Coping: Goal: Coping ability will improve Outcome: Progressing Goal: Will verbalize feelings Outcome: Progressing   Problem: Health Behavior/Discharge Planning: Goal: Ability to make decisions will improve Outcome: Progressing Goal: Compliance with therapeutic regimen will improve Outcome: Progressing    Problem: Role Relationship: Goal: Will demonstrate positive changes in social behaviors and relationships Outcome: Progressing   Problem: Safety: Goal: Ability to disclose and discuss suicidal ideas will improve Outcome: Progressing Goal: Ability to identify and utilize support systems that promote safety will improve Outcome: Progressing   Problem: Self-Concept: Goal: Will verbalize positive feelings about self Outcome: Progressing Goal: Level of anxiety will decrease Outcome: Progressing   

## 2020-04-06 NOTE — Progress Notes (Signed)
Patient is pleasant and easy to engage. Has no complaints and denies SI/HI/AVH depression and anxiety at this encounter. Patient received medications and tolerated without incident. Patient is active on the unit interacting well with peers. She remains safe with 15 minute safety check and agreed to contact staff with any concerns.

## 2020-04-06 NOTE — [facility] Group Notes (Signed)
[facility] LCSW Group Therapy Note  Date/Time: 04/06/2020 - 1:00pm  Type of Therapy/Topic:  Group Therapy:  Feelings about Diagnosis  Participation Level:  Active   Mood: Pleasant   Description of Group:    This group will allow patients to explore their thoughts and feelings about diagnoses they have received. Patients will be guided to explore their level of understanding and acceptance of these diagnoses. Facilitator will encourage patients to process their thoughts and feelings about the reactions of others to their diagnosis, and will guide patients in identifying ways to discuss their diagnosis with significant others in their lives. This group will be process-oriented, with patients participating in exploration of their own experiences as well as giving and receiving support and challenge from other group members.   Therapeutic Goals: 1. Patient will demonstrate understanding of diagnosis as evidence by identifying two or more symptoms of the disorder:  2. Patient will be able to express two feelings regarding the diagnosis 3. Patient will demonstrate ability to communicate their needs through discussion and/or role plays  Summary of Patient Progress:   Patient was active and engaged throughout group therapy. Patient asked to step out when she got overwhelmed with people talking. Patient stepped out of group for a few moments before coming back to group. Patient reports learning that she is bipolar and schizoaffective at the age of 89. Patient reports that she has had a mental illness for so long that she now knows what and what not to do. Patient reports that with her newly diagnosed diabetes that she really wants to move forward and take care of herself. Patient reports that her mother is even supportive and wanting to help her.      Therapeutic Modalities:   Cognitive Behavioral Therapy Brief Therapy Feelings Identification   Stephannie Peters, LCSW

## 2020-04-06 NOTE — Plan of Care (Signed)
The patient is calm and cooperative.  Problem: Education: Goal: Emotional status will improve Outcome: Progressing Goal: Mental status will improve Outcome: Progressing   

## 2020-04-07 LAB — GLUCOSE, CAPILLARY
Glucose-Capillary: 211 mg/dL — ABNORMAL HIGH (ref 70–99)
Glucose-Capillary: 198 mg/dL — ABNORMAL HIGH (ref 70–99)
Glucose-Capillary: 186 mg/dL — ABNORMAL HIGH (ref 70–99)
Glucose-Capillary: 144 mg/dL — ABNORMAL HIGH (ref 70–99)

## 2020-04-07 NOTE — Progress Notes (Signed)
[facility] MD Progress Note  04/07/2020 11:22 AM Cindy Leonard  MRN:  [id number] Subjective: Follow-up for patient with chronic mood instability.  Patient seen chart reviewed.  Patient reports she is feeling very good.  Not hyper or manic but appropriately happy.  Interacting with peers and staff appropriately.  No physical complaints.  Denies suicidal or homicidal thought. Principal Problem: Bipolar affective disorder, current episode manic (HCC) Diagnosis: Principal Problem:   Bipolar affective disorder, current episode manic (HCC) Active Problems:   DKA (diabetic ketoacidoses) (HCC)   Diabetes (HCC)  Total Time spent with patient: 30 minutes  Past Psychiatric History: Past history of longstanding behavior and mood problems  Past Medical History:  Past Medical History:  Diagnosis Date  . Bipolar 1 disorder (HCC)   . Depression   . Schizophrenia (HCC)    History reviewed. No pertinent surgical history. Family History:  Family History  Problem Relation Age of Onset  . Stroke Mother   . Diabetes Other   . Hypertension Other   . Asthma Father   . Asthma Brother    Family Psychiatric  History: See previous.  Not much is known. Social History:  Social History   Substance and Sexual Activity  Alcohol Use No     Social History   Substance and Sexual Activity  Drug Use No   Comment: clean of cocaine x 3 yrs    Social History   Socioeconomic History  . Marital status: Single    Spouse name: Not on file  . Number of children: Not on file  . Years of education: Not on file  . Highest education level: Not on file  Occupational History  . Not on file  Tobacco Use  . Smoking status: Never Smoker  . Smokeless tobacco: Never Used  Substance and Sexual Activity  . Alcohol use: No  . Drug use: No    Comment: clean of cocaine x 3 yrs  . Sexual activity: Yes    Birth control/protection: None  Other Topics Concern  . Not on file  Social History Narrative  . Not on file    Social Determinants of Health   Financial Resource Strain:   . Difficulty of Paying Living Expenses:   Food Insecurity:   . Worried About Programme researcher, broadcasting/film/video in the Last Year:   . Barista in the Last Year:   Transportation Needs:   . Freight forwarder (Medical):   Marland Kitchen Lack of Transportation (Non-Medical):   Physical Activity:   . Days of Exercise per Week:   . Minutes of Exercise per Session:   Stress:   . Feeling of Stress :   Social Connections:   . Frequency of Communication with Friends and Family:   . Frequency of Social Gatherings with Friends and Family:   . Attends Religious Services:   . Active Member of Clubs or Organizations:   . Attends Banker Meetings:   Marland Kitchen Marital Status:    Additional Social History:                         Sleep: Fair  Appetite:  Fair  Current Medications: Current Facility-Administered Medications  Medication Dose Route Frequency Provider Last Rate Last Admin  . acetaminophen (TYLENOL) tablet 650 mg  650 mg Oral Q6H PRN Cosimo Schertzer T, MD      . alum & mag hydroxide-simeth (MAALOX/MYLANTA) 200-200-20 MG/5ML suspension 30 mL  30 mL Oral Q4H  PRN Murel Shenberger, Jackquline Denmark, MD      . clonazePAM Scarlette Calico) disintegrating tablet 0.25 mg  0.25 mg Oral BID Yanira Tolsma, Jackquline Denmark, MD   0.25 mg at 04/07/20 0817  . docusate sodium (COLACE) capsule 100 mg  100 mg Oral BID Breigh Annett, Jackquline Denmark, MD   100 mg at 04/07/20 0817  . fluconazole (DIFLUCAN) tablet 100 mg  100 mg Oral Daily Mylea Roarty T, MD   100 mg at 04/07/20 0817  . haloperidol (HALDOL) tablet 5 mg  5 mg Oral QHS Philander Ake, Jackquline Denmark, MD   5 mg at 04/06/20 2133  . insulin aspart (novoLOG) injection 0-15 Units  0-15 Units Subcutaneous TID WC Kemberly Taves, Jackquline Denmark, MD   5 Units at 04/07/20 [phone]  . insulin aspart (novoLOG) injection 12 Units  12 Units Subcutaneous TID WC Clifton Safley, Jackquline Denmark, MD   12 Units at 04/07/20 [phone]  . insulin glargine (LANTUS) injection 48 Units  48 Units Subcutaneous  Daily Estreya Clay, Jackquline Denmark, MD   48 Units at 04/07/20 1001  . lamoTRIgine (LAMICTAL) tablet 200 mg  200 mg Oral Daily Jannice Beitzel, Jackquline Denmark, MD   200 mg at 04/07/20 0817  . magnesium hydroxide (MILK OF MAGNESIA) suspension 30 mL  30 mL Oral Daily PRN Forrestine Lecrone, Jackquline Denmark, MD   30 mL at 04/06/20 1753  . melatonin tablet 5 mg  5 mg Oral QHS Gillermo Murdoch, NP   5 mg at 04/06/20 2133  . nystatin cream (MYCOSTATIN)   Topical BID Gillermo Murdoch, NP   1 application at 04/07/20 0820    Lab Results:  Results for orders placed or performed during the hospital encounter of 04/04/20 (from the past 48 hour(s))  Glucose, capillary     Status: Abnormal   Collection Time: 04/05/20  4:12 PM  Result Value Ref Range   Glucose-Capillary 147 (H) 70 - 99 mg/dL    Comment: Glucose reference range applies only to samples taken after fasting for at least 8 hours.   Comment 1 Notify RN   Glucose, capillary     Status: Abnormal   Collection Time: 04/06/20 11:24 AM  Result Value Ref Range   Glucose-Capillary 228 (H) 70 - 99 mg/dL    Comment: Glucose reference range applies only to samples taken after fasting for at least 8 hours.  Glucose, capillary     Status: Abnormal   Collection Time: 04/06/20  4:21 PM  Result Value Ref Range   Glucose-Capillary 124 (H) 70 - 99 mg/dL    Comment: Glucose reference range applies only to samples taken after fasting for at least 8 hours.  Glucose, capillary     Status: Abnormal   Collection Time: 04/07/20  6:56 AM  Result Value Ref Range   Glucose-Capillary 211 (H) 70 - 99 mg/dL    Comment: Glucose reference range applies only to samples taken after fasting for at least 8 hours.   Comment 1 Notify RN     Blood Alcohol level:  No results found for: [facility]  Metabolic Disorder Labs: Lab Results  Component Value Date   HGBA1C 13.6 (H) 03/30/2020   MPG 343.62 03/30/2020   MPG 355 03/28/2020   No results found for: PROLACTIN No results found for: CHOL, TRIG, HDL, CHOLHDL, VLDL,  LDLCALC  Physical Findings: AIMS:  , ,  ,  ,    CIWA:    COWS:     Musculoskeletal: Strength & Muscle Tone: within normal limits Gait & Station: normal Patient leans: N/A  Psychiatric  Specialty Exam: Physical Exam  Nursing note and vitals reviewed. Constitutional: She appears well-developed and well-nourished.  HENT:  Head: Normocephalic and atraumatic.  Eyes: Pupils are equal, round, and reactive to light. Conjunctivae are normal.  Cardiovascular: Regular rhythm and normal heart sounds.  Respiratory: Effort normal. No respiratory distress.  GI: Soft.  Musculoskeletal:        General: Normal range of motion.     Cervical back: Normal range of motion.  Neurological: She is alert.  Skin: Skin is warm and dry.  Psychiatric: She has a normal mood and affect. Her behavior is normal. Judgment and thought content normal.    Review of Systems  Constitutional: Negative.   HENT: Negative.   Eyes: Negative.   Respiratory: Negative.   Cardiovascular: Negative.   Gastrointestinal: Negative.   Musculoskeletal: Negative.   Skin: Negative.   Neurological: Negative.   Psychiatric/Behavioral: Negative.     Blood pressure 121/84, pulse 92, temperature 98.2 F (36.8 C), temperature source Oral, resp. rate 18, height 5\' 7"  (1.702 m), weight 136 kg, SpO2 98 %, unknown if currently breastfeeding.Body mass index is 46.96 kg/m.  General Appearance: Casual  Eye Contact:  Good  Speech:  Clear and Coherent  Volume:  Normal  Mood:  Euthymic  Affect:  Congruent  Thought Process:  Goal Directed  Orientation:  Full (Time, Place, and Person)  Thought Content:  Logical  Suicidal Thoughts:  No  Homicidal Thoughts:  No  Memory:  Immediate;   Fair Recent;   Fair Remote;   Fair  Judgement:  Fair  Insight:  Fair  Psychomotor Activity:  Normal  Concentration:  Concentration: Fair  Recall:  [employer] of Knowledge:  Fair  Language:  Fair  Akathisia:  No  Handed:  Right  AIMS (if indicated):      Assets:  Desire for Improvement Housing Physical Health Social Support  ADL's:  Intact  Cognition:  WNL  Sleep:  Number of Hours: 6.5     Treatment Plan Summary: Daily contact with patient to assess and evaluate symptoms and progress in treatment, Medication management and Plan Patient is scheduled for discharge tomorrow.  Reviewed medication.  Encourage patient to continue outpatient follow-up.  Alethia Berthold, MD 04/07/2020, 11:22 AM

## 2020-04-07 NOTE — Plan of Care (Signed)
The patient is cooperative and pleasant.  Problem: Education: Goal: Emotional status will improve Outcome: Progressing Goal: Mental status will improve Outcome: Progressing   

## 2020-04-07 NOTE — Progress Notes (Signed)
F - Monitor Blood Glucose Levels  D - The patient was calm, cooperative, and pleasant.  She accepted medications as ordered and remained under behavioral control.  Cindy Leonard was eager to learn about her insulin and methods of maintaining a stable blood glucose level.  The patient remained visible in the milieu and socialized with peers.  Mood was stable and euthymic.    A - Medications and emotional support provided.  Time spent educating the patient about monitoring her blood glucose levels.    R - Continue with care.

## 2020-04-07 NOTE — B[facility] Group Notes (Signed)
LCSW Aftercare Discharge Planning Group Note  04/07/2020   Type of Group and Topic: Psychoeducational Group: Discharge Planning  Participation Level: Active  Description of Group  Discharge planning group reviews patient's anticipated discharge plans and assists patients to anticipate and address any barriers to wellness/recovery in the community. Suicide prevention education is reviewed with patients in group.  Therapeutic Goals  1. Patients will state their anticipated discharge plan and mental health aftercare  2. Patients will identify potential barriers to wellness in the community setting  3. Patients will engage in problem solving, solution focused discussion of ways to anticipate and address barriers to wellness/recovery  Summary of Patient Progress  Ledonna remained present throughout group and asked insightful and topical questions regarding outpatient follow up options. She offered encouragement to a peer.  Plan for Discharge/Comments: Anderson stated she would like to be established with peer support services or a [place] program. Patient spoke about wanting to get her disability income re-instated so she can find a group home. Patient expressed interest in CST referrals at discharge.  Transportation Means: Family or peer support, she reports a peer support provider helped with transportation in the past  Supports: Unknown  Therapeutic Modalities:  Motivational Interviewing  Darreld Mclean, [place]  04/07/2020 10:22 AM

## 2020-04-07 NOTE — Plan of Care (Signed)
Pleasant on approach, cooperative with treatment, medication compliant, she spent most of the evening in the dayroom with peers. She appears to be in bed resting quietly at this time.

## 2020-04-08 LAB — GLUCOSE, CAPILLARY
Glucose-Capillary: 227 mg/dL — ABNORMAL HIGH (ref 70–99)
Glucose-Capillary: 203 mg/dL — ABNORMAL HIGH (ref 70–99)

## 2020-04-08 MED ORDER — INSULIN GLARGINE 100 UNIT/ML ~~LOC~~ SOLN: 48.0000 [IU] | Freq: Every day | SUBCUTANEOUS | 2 refills | Status: DC

## 2020-04-08 MED ORDER — HALOPERIDOL 5 MG PO TABS: 5.0000 mg | ORAL_TABLET | Freq: Every day | ORAL | 1 refills | Status: DC

## 2020-04-08 MED ORDER — ACCU-CHEK AVIVA PLUS W/DEVICE KIT: PACK | 0 refills | Status: DC

## 2020-04-08 MED ORDER — MELATONIN 5 MG PO TABS: 5.0000 mg | ORAL_TABLET | Freq: Every day | ORAL | 1 refills | Status: DC

## 2020-04-08 MED ORDER — FLUCONAZOLE 100 MG PO TABS: 100.0000 mg | ORAL_TABLET | Freq: Every day | ORAL | 0 refills | Status: AC

## 2020-04-08 MED ORDER — LAMOTRIGINE 100 MG PO TABS: 200.0000 mg | ORAL_TABLET | Freq: Every day | ORAL | 1 refills | Status: DC

## 2020-04-08 MED ORDER — CLONAZEPAM 0.25 MG PO TBDP: 0.2500 mg | ORAL_TABLET | Freq: Two times a day (BID) | ORAL | 1 refills | Status: DC

## 2020-04-08 MED ORDER — ACCU-CHEK AVIVA PLUS VI STRP: ORAL_STRIP | 12 refills | Status: DC

## 2020-04-08 MED ORDER — NYSTATIN 100000 UNIT/GM EX CREA: TOPICAL_CREAM | Freq: Two times a day (BID) | CUTANEOUS | 0 refills | Status: DC

## 2020-04-08 MED ORDER — DOCUSATE SODIUM 100 MG PO CAPS: 100.0000 mg | ORAL_CAPSULE | Freq: Two times a day (BID) | ORAL | 1 refills | Status: DC

## 2020-04-08 MED ORDER — ACCU-CHEK SOFT TOUCH LANCETS MISC: 12 refills | Status: AC

## 2020-04-08 MED ORDER — INSULIN ASPART 100 UNIT/ML ~~LOC~~ SOLN: 12.0000 [IU] | Freq: Three times a day (TID) | SUBCUTANEOUS | 2 refills | Status: DC

## 2020-04-08 NOTE — Discharge Summary (Signed)
Physician Discharge Summary Note  Patient:  Cindy Leonard is an 24 y.o., female MRN:  [id number] DOB:  1996/04/27 Patient phone:  [phone] (home)  Patient address:   [address] [place] [address],  Total Time spent with patient: 30 minutes  Date of Admission:  04/04/2020 Date of Discharge: 04/08/2020  Reason for Admission: Transferred from the medical service where she had been treated for diabetic ketoacidosis.  Since admission to psychiatry 15-minute checks have been maintained.  During her admission to psychiatry she has not displayed any inappropriate behavior.  She has not shown any symptoms of psychosis.  She has been calm and appropriate throughout her stay and has been accepting of appropriate treatment.  Blood sugars have been brought under better control with nighttime as well as mealtime insulin.  Mood symptoms brought under control with modest dose of antipsychotic.  At the time of discharge patient is calm lucid completely denying suicidal ideation.  Expresses improved insight.  States a willingness to follow-up with outpatient mental health treatment.  Principal Problem: Bipolar affective disorder, current episode manic [facility]) Discharge Diagnoses: Principal Problem:   Bipolar affective disorder, current episode manic ([place]) Active Problems:   DKA (diabetic ketoacidoses) ([place])   Diabetes ([place])   Past Psychiatric History: Past history of chronic mood and behavior problems  Past Medical History:  Past Medical History:  Diagnosis Date  . Bipolar 1 disorder ([place])   . Depression   . Schizophrenia ([place] )    History reviewed. No pertinent surgical history. Family History:  Family History  Problem Relation Age of Onset  . Stroke Mother   . Diabetes Other   . Hypertension Other   . Asthma Father   . Asthma Brother    Family Psychiatric  History: See previous Social History:  Social History   Substance and Sexual Activity  Alcohol Use No     Social History    Substance and Sexual Activity  Drug Use No   Comment: clean of cocaine x 3 yrs    Social History   Socioeconomic History  . Marital status: Single    Spouse name: Not on file  . Number of children: Not on file  . Years of education: Not on file  . Highest education level: Not on file  Occupational History  . Not on file  Tobacco Use  . Smoking status: Never Smoker  . Smokeless tobacco: Never Used  Substance and Sexual Activity  . Alcohol use: No  . Drug use: No    Comment: clean of cocaine x 3 yrs  . Sexual activity: Yes    Birth control/protection: None  Other Topics Concern  . Not on file  Social History Narrative  . Not on file   Social Determinants of Health   Financial Resource Strain:   . Difficulty of Paying Living Expenses:   Food Insecurity:   . Worried About Charity fundraiser in the Last Year:   . Arboriculturist in the Last Year:   Transportation Needs:   . Film/video editor (Medical):   Marland Kitchen Lack of Transportation (Non-Medical):   Physical Activity:   . Days of Exercise per Week:   . Minutes of Exercise per Session:   Stress:   . Feeling of Stress :   Social Connections:   . Frequency of Communication with Friends and Family:   . Frequency of Social Gatherings with Friends and Family:   . Attends Religious Services:   . Active Member of  Clubs or Organizations:   . Attends Archivist Meetings:   Marland Kitchen Marital Status:     Hospital Course: Continued on 15-minute checks.  Blood sugars brought under control with insulin.  Patient was engaged in individual and group therapy.  She showed no dangerous behavior and showed no psychotic symptoms.  Modest dose Haldol use for behavior and mood.  She was sleeping better and interacting very appropriately with staff and peers.  Agrees to outpatient mental health care.  Physical Findings: AIMS:  , ,  ,  ,    CIWA:    COWS:     Musculoskeletal: Strength & Muscle Tone: within normal limits Gait &  Station: normal Patient leans: N/A  Psychiatric Specialty Exam: Physical Exam  Nursing note and vitals reviewed. Constitutional: She appears well-developed and well-nourished.  HENT:  Head: Normocephalic and atraumatic.  Eyes: Pupils are equal, round, and reactive to light. Conjunctivae are normal.  Cardiovascular: Regular rhythm and normal heart sounds.  Respiratory: Effort normal. No respiratory distress.  GI: Soft.  Musculoskeletal:        General: Normal range of motion.     Cervical back: Normal range of motion.  Neurological: She is alert.  Skin: Skin is warm and dry.  Psychiatric: She has a normal mood and affect. Her speech is normal and behavior is normal. Judgment and thought content normal. Cognition and memory are normal.    Review of Systems  Constitutional: Negative.   HENT: Negative.   Eyes: Negative.   Respiratory: Negative.   Cardiovascular: Negative.   Gastrointestinal: Negative.   Musculoskeletal: Negative.   Skin: Negative.   Neurological: Negative.   Psychiatric/Behavioral: Negative.     Blood pressure 127/89, pulse 77, temperature 98 F (36.7 C), temperature source Oral, resp. rate 17, height 5' 7" (1.702 m), weight 136 kg, SpO2 100 %, unknown if currently breastfeeding.Body mass index is 46.96 kg/m.  General Appearance: Casual  Eye Contact:  Good  Speech:  Clear and Coherent  Volume:  Normal  Mood:  Euthymic  Affect:  Constricted  Thought Process:  Goal Directed  Orientation:  Full (Time, Place, and Person)  Thought Content:  Logical  Suicidal Thoughts:  No  Homicidal Thoughts:  No  Memory:  Immediate;   Fair Recent;   Fair Remote;   Fair  Judgement:  Fair  Insight:  Fair  Psychomotor Activity:  Normal  Concentration:  Concentration: Fair  Recall:  [employer] of Knowledge:  Fair  Language:  Fair  Akathisia:  No  Handed:  Right  AIMS (if indicated):     Assets:  Desire for Improvement  ADL's:  Intact  Cognition:  WNL  Sleep:  Number  of Hours: 7.5        Has this patient used any form of tobacco in the last 30 days? (Cigarettes, Smokeless Tobacco, Cigars, and/or Pipes) Yes, No  Blood Alcohol level:  No results found for: [facility]  Metabolic Disorder Labs:  Lab Results  Component Value Date   HGBA1C 13.6 (H) 03/30/2020   MPG 343.62 03/30/2020   MPG 355 03/28/2020   No results found for: PROLACTIN No results found for: CHOL, TRIG, HDL, CHOLHDL, VLDL, LDLCALC  See Psychiatric Specialty Exam and Suicide Risk Assessment completed by Attending Physician prior to discharge.  Discharge destination:  Home  Is patient on multiple antipsychotic therapies at discharge:  No   Has Patient had three or more failed trials of antipsychotic monotherapy by history:  No  Recommended Plan  for Multiple Antipsychotic Therapies: NA  Discharge Instructions    Diet - low sodium heart healthy   Complete by: As directed    Increase activity slowly   Complete by: As directed      Allergies as of 04/08/2020      Reactions   Food Swelling   Eyes/throat swelling (blueberries)      Medication List    STOP taking these medications   alum & mag hydroxide-simeth 200-200-20 MG/5ML suspension Commonly known as: MAALOX/MYLANTA   benztropine 0.5 MG tablet Commonly known as: COGENTIN   clonazePAM 1 MG tablet Commonly known as: KLONOPIN Replaced by: clonazePAM 0.25 MG disintegrating tablet   dextrose 50 % solution   FLUoxetine 10 MG capsule Commonly known as: PROZAC   lactulose 10 GM/15ML solution Commonly known as: CHRONULAC     TAKE these medications     Indication  Accu-Chek Aviva Plus test strip Generic drug: glucose blood Use as instructed  Indication: Diabetes   Accu-Chek Aviva Plus w/Device Kit Use as directed  Indication: Diabetes   accu-chek soft touch lancets Use as instructed  Indication: Diabetes   clonazePAM 0.25 MG disintegrating tablet Commonly known as: KLONOPIN Take 1 tablet (0.25 mg total) by  mouth 2 (two) times daily. Replaces: clonazePAM 1 MG tablet  Indication: Agitation   docusate sodium 100 MG capsule Commonly known as: COLACE Take 1 capsule (100 mg total) by mouth 2 (two) times daily.  Indication: Constipation   fluconazole 100 MG tablet Commonly known as: DIFLUCAN Take 1 tablet (100 mg total) by mouth daily for 7 days.  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  Indication: Problems with Behavior, Abnormally Increased Activity   insulin aspart 100 UNIT/ML injection Commonly known as: novoLOG Inject 12 Units into the skin 3 (three) times daily with meals. What changed:   how much to take  Another medication with the same name was removed. Continue taking this medication, and follow the directions you see here.  Indication: Type 2 Diabetes   insulin glargine 100 UNIT/ML injection Commonly known as: LANTUS Inject 0.48 mLs (48 Units total) into the skin daily.  Indication: Type 2 Diabetes   lamoTRIgine 100 MG tablet Commonly known as: LAMICTAL Take 2 tablets (200 mg total) by mouth daily.  Indication: Manic-Depression   melatonin 5 MG Tabs Take 1 tablet (5 mg total) by mouth at bedtime. What changed:   how much to take  when to take this  Indication: Trouble Sleeping   nystatin cream Commonly known as: MYCOSTATIN Apply topically 2 (two) times daily. To affected area What changed: additional instructions  Indication: Skin Infection due to Candida Yeast      Follow-up Information    [facility] Follow up.   Contact information: [place] [address] [phone]        [facility] Follow up.   Contact information: [address] [place] [address] [phone]           Follow-up recommendations:  Activity:  Activity as tolerated Diet:  Diabetic diet Other:  Follow-up with  outpatient mental health services and medical treatment  Comments: Prescriptions given at discharge  Signed: Alethia Berthold, MD 04/08/2020, 9:25 AM

## 2020-04-08 NOTE — Plan of Care (Signed)
  Problem: Education: Goal: Emotional status will improve Outcome: Progressing Goal: Mental status will improve Outcome: Progressing Goal: Verbalization of understanding the information provided will improve Outcome: Progressing   Problem: Activity: Goal: Sleeping patterns will improve Outcome: Adequate for Discharge   Problem: Safety: Goal: Periods of time without injury will increase Outcome: Progressing

## 2020-04-08 NOTE — Progress Notes (Signed)
Patient calm and cooperative, appropriate with staff and peers. Medication compliant. Denies SI, HI, AVH. Noted in Dayroom socializing with peers. Reports feeling better, eating well and sleeping well.  Encouragement and support provided. Safety checks maintained. Medications given as prescribed. Pt receptive and remains safe on unit with q15 min checks.

## 2020-04-08 NOTE — BHH Suicide Risk Assessment (Signed)
[facility] Discharge Suicide Risk Assessment   Principal Problem: Bipolar affective disorder, current episode manic [facility]) Discharge Diagnoses: Principal Problem:   Bipolar affective disorder, current episode manic (HCC) Active Problems:   DKA (diabetic ketoacidoses) (HCC)   Diabetes (HCC)   Total Time spent with patient: 30 minutes  Musculoskeletal: Strength & Muscle Tone: within normal limits Gait & Station: normal Patient leans: N/A  Psychiatric Specialty Exam: Review of Systems  Constitutional: Negative.   HENT: Negative.   Eyes: Negative.   Respiratory: Negative.   Cardiovascular: Negative.   Gastrointestinal: Negative.   Musculoskeletal: Negative.   Skin: Negative.   Neurological: Negative.   Psychiatric/Behavioral: Negative.     Blood pressure 127/89, pulse 77, temperature 98 F (36.7 C), temperature source Oral, resp. rate 17, height 5\' 7"  (1.702 m), weight 136 kg, SpO2 100 %, unknown if currently breastfeeding.Body mass index is 46.96 kg/m.  General Appearance: Casual  Eye Contact::  Good  Speech:  Clear and Coherent409  Volume:  Normal  Mood:  Euthymic  Affect:  Congruent  Thought Process:  Goal Directed  Orientation:  Full (Time, Place, and Person)  Thought Content:  Logical  Suicidal Thoughts:  No  Homicidal Thoughts:  No  Memory:  Immediate;   Fair Recent;   Fair Remote;   Fair  Judgement:  Fair  Insight:  Fair  Psychomotor Activity:  Normal  Concentration:  Fair  Recall:  [id number] of Knowledge:Fair  Language: Fair  Akathisia:  No  Handed:  Right  AIMS (if indicated):     Assets:  Desire for Improvement Housing Resilience Social Support  Sleep:  Number of Hours: 7.5  Cognition: WNL  ADL's:  Intact   Mental Status Per Nursing Assessment::   On Admission:  NA  Demographic Factors:  Unemployed  Loss Factors: Decline in physical health  Historical Factors: Impulsivity  Risk Reduction Factors:   Living with another person, especially a  relative, Positive social support and Positive therapeutic relationship  Continued Clinical Symptoms:  Bipolar Disorder:   [place]ixed State Medical Diagnoses and Treatments/Surgeries  Cognitive Features That Contribute To Risk:  None    Suicide Risk:  Minimal: No identifiable suicidal ideation.  Patients presenting with no risk factors but with morbid ruminations; may be classified as minimal risk based on the severity of the depressive symptoms  Follow-up Information    [facility] Follow up.   Contact information: [address] [place] [phone]           Plan Of Care/Follow-up recommendations:  Activity:  Activity as tolerated Diet:  Diabetic diet Other:  Follow-up with outpatient care at [facility] and outpatient medical care  [facility], MD 04/08/2020, 9:16 AM

## 2020-04-08 NOTE — Progress Notes (Addendum)
Recreation Therapy Notes   Date: 04/08/2020  Time: 9:30 am  Location: Craft room  Behavioral response: Appropriate  Intervention Topic: Goals   Discussion/Intervention:  Group content on today was focused on goals. Patients described what goals are and how they define goals. Individuals expressed how they go about setting goals and reaching them. The group identified how important goals are and if they make short term goals to reach long term goals. Patients described how many goals they work on at a time and what affects them not reaching their goal. Individuals described how much time they put into planning and obtaining their goals. The group participated in the intervention "My Goal Board" and made personal goal boards to help them achieve their goal. Clinical Observations/Feedback:  Patient came to group and defined goals as something you achieve and new ways to develop as a person. She stated that her goals are to maintain her attitude, lose weight and stay away from social media. Individual was social with peers and staff while participating on the intervention.  [name] LRT/CTRS         [name] 04/08/2020 11:59 AM

## 2020-04-08 NOTE — Progress Notes (Signed)
Recreation Therapy Notes  INPATIENT RECREATION TR PLAN  Patient Details Name: Cindy Leonard MRN: [id number] DOB: 1996-09-24 Today's Date: 04/08/2020  Rec Therapy Plan Is patient appropriate for Therapeutic Recreation?: Yes Treatment times per week: at least 3 Estimated Length of Stay: 5-7 days TR Treatment/Interventions: Group participation (Comment)  Discharge Criteria Pt will be discharged from therapy if:: Discharged Treatment plan/goals/alternatives discussed and agreed upon by:: Patient/family  Discharge Summary Short term goals set: Patient will demonstrate improved communication skills by spontaneously contributing to 2 group discussions within 5 recreation therapy group sessions Short term goals met: Complete Progress toward goals comments: Groups attended Which groups?: Other (Comment), Goal setting(Emotions) Reason goals not met: N/A Therapeutic equipment acquired: N/A Reason patient discharged from therapy: Discharge from hospital Pt/family agrees with progress & goals achieved: Yes Date patient discharged from therapy: 04/08/20   Edrei Norgaard 04/08/2020, 3:29 PM

## 2020-04-08 NOTE — Progress Notes (Signed)
  [facility] Adult Case Management Discharge Plan :  Will you be returning to the same living situation after discharge:  Yes,  pt reports she is returning to the home with her mother.  At discharge, do you have transportation home?: Yes,  pt reports brother will provide transportation.   Do you have the ability to pay for your medications: Yes,  [facility].  Release of information consent forms completed and in the chart;  Patient's signature needed at discharge.  Patient to Follow up at: Follow-up Information    [facility] Follow up.   Why: Medication management appointment is 04/24/2020 at 1:00PM, therapy is scheduled for 04/24/2020 at 2:00PM.  Thanks! Contact information: [address] [place], [place] [address] Phone: [phone] Fax: [phone] [email or web address] Office Hours: Monday - Friday 8:00 a.m. - 5:00 p.m.          Next level of care provider has access to [facility] Link:yes  Safety Planning and Suicide Prevention discussed: Yes,  SPE completed with patients mother.     Has patient been referred to the Quitline?: Patient refused referral  Patient has been referred for addiction treatment: Pt. refused referral  Harden Mo, LCSW 04/08/2020, 9:51 AM

## 2020-04-08 NOTE — Progress Notes (Addendum)
Patient ID: Cindy Leonard, female   DOB: [birth year], 24 y.o.   MRN: [id number]  Discharge Note:  Patient denies SI/HI/AVH at this time. Discharge instructions, AVS, prescriptions, discharge note and transition record gone over with patient. Patient agrees to comply with medication management, follow-up visit, and outpatient therapy. Patient belongings returned to patient. Patient questions and concerns addressed and answered. Patient ambulatory off unit. Patient discharged to home with her Brother.

## 2020-04-08 NOTE — Plan of Care (Signed)
Problem: Communication Goal: STG - Patient will demonstrate improved communication skills by spontaneously contributing to 2 group discussions within 5 recreation therapy group sessions Description: STG - Patient will demonstrate improved communication skills by spontaneously contributing to 2 group discussions within 5 recreation therapy group sessions Outcome: Completed/Met   

## 2020-04-08 NOTE — Progress Notes (Signed)
D- Patient alert and oriented. Patient presents in a pleasant mood on assessment stating that she slept good last night, "I be out when they give me my Melatonin". Patient had no complaints or concerns to voice to this Clinical research associate. Patient denies SI, HI, AVH, and pain at this time. Patient also denies any depression, however, she stated that she's a little anxious about going home. Patient's goal for today is "leaving, going home".   A- Scheduled medications administered to patient, per MD orders. Support and encouragement provided.  Routine safety checks conducted every 15 minutes.  Patient informed to notify staff with problems or concerns.  R- No adverse drug reactions noted. Patient contracts for safety at this time. Patient compliant with medications and treatment plan. Patient receptive, calm, and cooperative. Patient interacts well with others on the unit.  Patient remains safe at this time.

## 2020-04-19 ENCOUNTER — Other Ambulatory Visit: Payer: Self-pay

## 2020-04-19 ENCOUNTER — Encounter: Payer: Self-pay | Admitting: Emergency Medicine

## 2020-04-19 ENCOUNTER — Emergency Department
Admission: EM | Admit: 2020-04-19 | Discharge: 2020-04-20 | Disposition: A | Discharge status: Other Acute Inpatient Hospital | Payer: Medicaid Other | Attending: Emergency Medicine | Admitting: Emergency Medicine

## 2020-04-19 DIAGNOSIS — R45850 Homicidal ideations: Secondary | ICD-10-CM

## 2020-04-19 DIAGNOSIS — F209 Schizophrenia, unspecified: Secondary | ICD-10-CM | POA: Diagnosis present

## 2020-04-19 DIAGNOSIS — Z794 Long term (current) use of insulin: Secondary | ICD-10-CM | POA: Diagnosis not present

## 2020-04-19 DIAGNOSIS — F250 Schizoaffective disorder, bipolar type: Secondary | ICD-10-CM | POA: Insufficient documentation

## 2020-04-19 DIAGNOSIS — R45851 Suicidal ideations: Secondary | ICD-10-CM

## 2020-04-19 DIAGNOSIS — E119 Type 2 diabetes mellitus without complications: Secondary | ICD-10-CM | POA: Diagnosis not present

## 2020-04-19 DIAGNOSIS — Z79899 Other long term (current) drug therapy: Secondary | ICD-10-CM | POA: Insufficient documentation

## 2020-04-19 DIAGNOSIS — Z20822 Contact with and (suspected) exposure to covid-19: Secondary | ICD-10-CM | POA: Diagnosis not present

## 2020-04-19 DIAGNOSIS — Z046 Encounter for general psychiatric examination, requested by authority: Secondary | ICD-10-CM | POA: Diagnosis present

## 2020-04-19 LAB — URINE DRUG SCREEN, QUALITATIVE (ARMC ONLY)
Amphetamines, Ur Screen: NOT DETECTED
Barbiturates, Ur Screen: NOT DETECTED
Benzodiazepine, Ur Scrn: NOT DETECTED
Cannabinoid 50 Ng, Ur ~~LOC~~: NOT DETECTED
Cocaine Metabolite,Ur ~~LOC~~: NOT DETECTED
MDMA (Ecstasy)Ur Screen: NOT DETECTED
Methadone Scn, Ur: NOT DETECTED
Opiate, Ur Screen: NOT DETECTED
Phencyclidine (PCP) Ur S: NOT DETECTED
Tricyclic, Ur Screen: NOT DETECTED

## 2020-04-19 LAB — COMPREHENSIVE METABOLIC PANEL
ALT: 33 U/L (ref 0–44)
AST: 19 U/L (ref 15–41)
Albumin: 4 g/dL (ref 3.5–5.0)
Alkaline Phosphatase: 65 U/L (ref 38–126)
Anion gap: 8 (ref 5–15)
BUN: 13 mg/dL (ref 6–20)
CO2: 28 mmol/L (ref 22–32)
Calcium: 9.5 mg/dL (ref 8.9–10.3)
Chloride: 102 mmol/L (ref 98–111)
Creatinine, Ser: 0.98 mg/dL (ref 0.44–1.00)
GFR calc Af Amer: >60 mL/min (ref >60)
GFR calc non Af Amer: >60 mL/min (ref >60)
Glucose, Bld: 203 mg/dL — ABNORMAL HIGH (ref 70–99)
Potassium: 3.8 mmol/L (ref 3.5–5.1)
Sodium: 138 mmol/L (ref 135–145)
Total Bilirubin: 0.6 mg/dL (ref 0.3–1.2)
Total Protein: 7.8 g/dL (ref 6.5–8.1)

## 2020-04-19 LAB — SALICYLATE LEVEL: Salicylate Lvl: <7 mg/dL — ABNORMAL LOW (ref 7.0–30.0)

## 2020-04-19 LAB — CBC
HCT: 42.7 % (ref 36.0–46.0)
Hemoglobin: 13.5 g/dL (ref 12.0–15.0)
MCH: 26.3 pg (ref 26.0–34.0)
MCHC: 31.6 g/dL (ref 30.0–36.0)
MCV: 83.1 fL (ref 80.0–100.0)
Platelets: 293 10*3/uL (ref 150–400)
RBC: 5.14 MIL/uL — ABNORMAL HIGH (ref 3.87–5.11)
RDW: 14.3 % (ref 11.5–15.5)
WBC: 6.7 10*3/uL (ref 4.0–10.5)
nRBC: 0 % (ref 0.0–0.2)

## 2020-04-19 LAB — ACETAMINOPHEN LEVEL: Acetaminophen (Tylenol), Serum: <10 ug/mL — ABNORMAL LOW (ref 10–30)

## 2020-04-19 LAB — ETHANOL: Alcohol, Ethyl (B): <10 mg/dL (ref <10)

## 2020-04-19 LAB — POCT PREGNANCY, URINE: Preg Test, Ur: NEGATIVE

## 2020-04-19 LAB — GLUCOSE, CAPILLARY: Glucose-Capillary: 181 mg/dL — ABNORMAL HIGH (ref 70–99)

## 2020-04-19 NOTE — ED Notes (Addendum)
1 earring with red stone, 1 pair flip flops, 1 pair burgundy shorts, 1 pair blue underwear, 1 pink purse, 1 blue and white plaid book bag, 1 brown sports bra.    Pt's changed into hospital scrub pants at this time, pt continues to wear her personal T-shirt due to not having hospital scrub top that will fit patient.   Pt with L tragus piercing that remains in due to this RN and Judeth Cornfield, EDT being unable to remove it.
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
## 2020-04-20 DIAGNOSIS — F209 Schizophrenia, unspecified: Secondary | ICD-10-CM

## 2020-04-20 DIAGNOSIS — R45850 Homicidal ideations: Secondary | ICD-10-CM | POA: Insufficient documentation

## 2020-04-20 LAB — GLUCOSE, CAPILLARY
Glucose-Capillary: 184 mg/dL — ABNORMAL HIGH (ref 70–99)
Glucose-Capillary: 225 mg/dL — ABNORMAL HIGH (ref 70–99)

## 2020-04-20 LAB — RESP PANEL BY RT PCR (RSV, FLU A&B, COVID)
Influenza A by PCR: NEGATIVE
Influenza B by PCR: NEGATIVE
Respiratory Syncytial Virus by PCR: NEGATIVE
SARS Coronavirus 2 by RT PCR: NEGATIVE

## 2020-04-20 MED ORDER — INSULIN GLARGINE 100 UNIT/ML ~~LOC~~ SOLN
48.0000 [IU] | Freq: Every day | SUBCUTANEOUS | Status: DC
  Filled 2020-04-20: qty 0.48

## 2020-04-20 MED ORDER — INSULIN ASPART 100 UNIT/ML ~~LOC~~ SOLN
12.0000 [IU] | Freq: Three times a day (TID) | SUBCUTANEOUS | Status: DC
  Administered 2020-04-20: 12 [IU] via SUBCUTANEOUS
  Filled 2020-04-20: qty 1

## 2020-04-20 NOTE — ED Notes (Signed)
Pt brought into ED BHU via sally port and wand with metal detector for safety by [employer] Security officer. Patient oriented to unit/care area: Pt informed of unit policies and procedures.  Informed that, for their safety, care areas are designed for safety and monitored by security cameras at all times. Patient verbalizes understanding, and verbal contract for safety obtained.Pt shown to their room.  
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
## 2020-04-20 NOTE — ED Notes (Signed)
Pt verbalized understanding for need to transport to [place] for continued care. Pt is in NAD at this time. Pt transported with [facility].

## 2020-04-20 NOTE — ED Notes (Signed)
Pt provided with snack at this time. Reports not eating any dinner prior to arrival.

## 2020-04-20 NOTE — BH Assessment (Signed)
Patient has been accepted to [facility].  Patient assigned "Cheron Every" Unit Accepting physician is Dr. Otho Perl.  Call report to [phone].  Representative was [employer].   ER Staff is aware of it:  Luann, ER Secretary  Selena Batten, Patient's Nurse

## 2020-04-20 NOTE — ED Notes (Signed)
EMTALA reviewed by this RN.  

## 2020-04-20 NOTE — ED Provider Notes (Signed)
Patient accepted to [facility] by Dr. Recardo Evangelist, MD 04/20/20 [phone]

## 2020-04-20 NOTE — ED Notes (Signed)
This RN spoke with Summer, Charity fundraiser at [employer] to give report.
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
## 2020-04-20 NOTE — BH Assessment (Addendum)
Referral information for Psychiatric Hospitalization faxed to;    [facility] ([phone]),    [facility] [phone]), Currently under review with [facility] [phone]), Currently under review   [facility] [phone] -or- [phone]),    [facility] [phone])
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
## 2020-04-20 NOTE — ED Notes (Addendum)
Pt is cooperative at this time. Reports she gets angry at family at home and has HI towards those at home. Denies any SI

## 2020-04-24 ENCOUNTER — Encounter (HOSPITAL_COMMUNITY): Payer: Self-pay | Admitting: Psychiatry

## 2020-04-24 ENCOUNTER — Ambulatory Visit (HOSPITAL_COMMUNITY): Payer: Medicaid Other | Admitting: Psychiatry

## 2020-04-24 ENCOUNTER — Ambulatory Visit (HOSPITAL_COMMUNITY): Payer: Self-pay | Admitting: Clinical

## 2020-07-10 ENCOUNTER — Emergency Department
Admission: EM | Admit: 2020-07-10 | Discharge: 2020-07-10 | Disposition: A | Discharge status: Home / Self Care | Payer: Medicaid Other | Attending: Emergency Medicine | Admitting: Emergency Medicine

## 2020-07-10 ENCOUNTER — Other Ambulatory Visit: Payer: Self-pay

## 2020-07-10 ENCOUNTER — Emergency Department: Payer: Medicaid Other

## 2020-07-10 DIAGNOSIS — R0789 Other chest pain: Secondary | ICD-10-CM | POA: Diagnosis present

## 2020-07-10 DIAGNOSIS — E860 Dehydration: Secondary | ICD-10-CM | POA: Insufficient documentation

## 2020-07-10 DIAGNOSIS — Z794 Long term (current) use of insulin: Secondary | ICD-10-CM | POA: Diagnosis not present

## 2020-07-10 DIAGNOSIS — E1165 Type 2 diabetes mellitus with hyperglycemia: Secondary | ICD-10-CM | POA: Diagnosis not present

## 2020-07-10 DIAGNOSIS — R358 Other polyuria: Secondary | ICD-10-CM | POA: Insufficient documentation

## 2020-07-10 DIAGNOSIS — R631 Polydipsia: Secondary | ICD-10-CM | POA: Diagnosis not present

## 2020-07-10 DIAGNOSIS — R3589 Other polyuria: Secondary | ICD-10-CM

## 2020-07-10 DIAGNOSIS — R739 Hyperglycemia, unspecified: Secondary | ICD-10-CM

## 2020-07-10 LAB — BLOOD GAS, VENOUS
Acid-Base Excess: 3.4 mmol/L — ABNORMAL HIGH (ref 0.0–2.0)
Bicarbonate: 28.5 mmol/L — ABNORMAL HIGH (ref 20.0–28.0)
O2 Saturation: 87.7 %
Patient temperature: 37
pCO2, Ven: 44 mmHg (ref 44.0–60.0)
pH, Ven: 7.42 (ref 7.250–7.430)
pO2, Ven: 53 mmHg — ABNORMAL HIGH (ref 32.0–45.0)

## 2020-07-10 LAB — URINALYSIS, COMPLETE (UACMP) WITH MICROSCOPIC
Bilirubin Urine: NEGATIVE
Glucose, UA: >=500 mg/dL — AB
Ketones, ur: 20 mg/dL — AB
Nitrite: NEGATIVE
Protein, ur: NEGATIVE mg/dL
RBC / HPF: >50 RBC/hpf — ABNORMAL HIGH (ref 0–5)
Specific Gravity, Urine: 1.038 — ABNORMAL HIGH (ref 1.005–1.030)
pH: 5 (ref 5.0–8.0)

## 2020-07-10 LAB — BASIC METABOLIC PANEL
Anion gap: 15 (ref 5–15)
BUN: 18 mg/dL (ref 6–20)
CO2: 22 mmol/L (ref 22–32)
Calcium: 9.3 mg/dL (ref 8.9–10.3)
Chloride: 95 mmol/L — ABNORMAL LOW (ref 98–111)
Creatinine, Ser: 1.17 mg/dL — ABNORMAL HIGH (ref 0.44–1.00)
GFR calc Af Amer: >60 mL/min (ref >60)
GFR calc non Af Amer: >60 mL/min (ref >60)
Glucose, Bld: 460 mg/dL — ABNORMAL HIGH (ref 70–99)
Potassium: 4.7 mmol/L (ref 3.5–5.1)
Sodium: 132 mmol/L — ABNORMAL LOW (ref 135–145)

## 2020-07-10 LAB — GLUCOSE, CAPILLARY
Glucose-Capillary: 467 mg/dL — ABNORMAL HIGH (ref 70–99)
Glucose-Capillary: 394 mg/dL — ABNORMAL HIGH (ref 70–99)

## 2020-07-10 LAB — CBC
HCT: 48.3 % — ABNORMAL HIGH (ref 36.0–46.0)
Hemoglobin: 15.7 g/dL — ABNORMAL HIGH (ref 12.0–15.0)
MCH: 26.6 pg (ref 26.0–34.0)
MCHC: 32.5 g/dL (ref 30.0–36.0)
MCV: 81.9 fL (ref 80.0–100.0)
Platelets: 180 10*3/uL (ref 150–400)
RBC: 5.9 MIL/uL — ABNORMAL HIGH (ref 3.87–5.11)
RDW: 14 % (ref 11.5–15.5)
WBC: 8.8 10*3/uL (ref 4.0–10.5)
nRBC: 0 % (ref 0.0–0.2)

## 2020-07-10 LAB — TROPONIN I (HIGH SENSITIVITY): Troponin I (High Sensitivity): 8 ng/L (ref <18)

## 2020-07-10 MED ORDER — LACTATED RINGERS IV BOLUS
1000.0000 mL | Freq: Once | INTRAVENOUS | Status: AC
  Administered 2020-07-10: 1000 mL via INTRAVENOUS

## 2020-07-10 NOTE — ED Triage Notes (Signed)
Pt presents via POV with c/o chest pain and hyperglycemia. Pt compliant with medications at home. Pt states glucometer reads "high" for past two days. Pt also c/o central chest pain, non-radiating that has been ongoing for 2 days. Pt states pain is dull and makes it painful to eat.

## 2020-07-10 NOTE — ED Notes (Signed)
Pt presents to ED w/ hyperglycemia following 2 days of high BG per pt's glucometer. Pt states she is compliant w/ insulin. Pt reports urinary frequency and increased thirst x 2 days. Pt states she was treated for hyperglycemia at hospital in [place] x 2 months ago.

## 2020-07-10 NOTE — ED Provider Notes (Signed)
[facility] Emergency Department Provider Note ____________________________________________   First MD Initiated Contact with Patient 07/10/20 1318     (approximate)  I have reviewed the triage vital signs and the nursing notes.  HISTORY  Chief Complaint Chest Pain and Hyperglycemia   HPI Cindy Leonard is a 24 y.o. femalewho presents to the ED for evaluation of chest pain and hyperglycemia.  Chart review indicates history of anxiety, bipolar, schizophrenia and DM on insulin.  Patient reports her glucometer reading "high" for the past 2 days.  She reports associated polyuria, polydipsia and chest pain.  She denies dysuria, but does report 1-2 episodes of urinary incontinence.  Denies diarrhea, stool incontinence, saddle anesthesias, back trauma, fevers or abdominal pain.  Regarding her chest pain, she reports 3/10 substernal aching chest pain that is constant over the past 2 days concurrent with her hyperglycemia.  This is nonradiating and she takes no medications to help alleviate this.  She reports having her insulin at home and be compliant with her medications at baseline.  She reports not having a PCP right now as she recently moved to this county and is looking to establish with a PCP.  She reports having multiple refills with her insulin pens from a July hospitalization and to her own for acute psychiatric care.  Patient is on her second liter of IV fluids when I talk to her and she reports resolving symptoms.  She reports her chest pain is no longer present and reports hunger.  Past Medical History:  Diagnosis Date  . Bipolar 1 disorder ([place])   . Depression   . Schizophrenia [facility])     Patient Active Problem List   Diagnosis Date Noted  . Homicidal ideation   . Bipolar affective disorder, current episode manic ([place]) 04/04/2020  . Diabetes ([place]) 04/04/2020  . DKA (diabetic ketoacidoses) ([place]) 03/28/2020  . Depression 03/28/2020  . Hyponatremia  03/28/2020  . Elevated blood pressure reading 09/14/2018  . Acne 09/14/2018  . Morbid obesity ([place]) 03/22/2017  . Hx of bipolar disorder 03/22/2017  . Schizophrenia ([place]) 03/22/2017    No past surgical history on file.  Prior to Admission medications   Medication Sig Start Date End Date Taking? Authorizing Provider  Blood Glucose Monitoring Suppl (ACCU-CHEK AVIVA PLUS) w/Device KIT Use as directed 04/08/20   Clapacs, Madie Reno, MD  clonazePAM (KLONOPIN) 0.25 MG disintegrating tablet Take 1 tablet (0.25 mg total) by mouth 2 (two) times daily. 04/08/20   Clapacs, Madie Reno, MD  docusate sodium (COLACE) 100 MG capsule Take 1 capsule (100 mg total) by mouth 2 (two) times daily. 04/08/20   Clapacs, Madie Reno, MD  glucose blood (ACCU-CHEK AVIVA PLUS) test strip Use as instructed 04/08/20   Clapacs, Madie Reno, MD  haloperidol (HALDOL) 5 MG tablet Take 1 tablet (5 mg total) by mouth at bedtime. 04/08/20   Clapacs, Madie Reno, MD  insulin aspart (NOVOLOG) 100 UNIT/ML injection Inject 12 Units into the skin 3 (three) times daily with meals. 04/08/20   Clapacs, Madie Reno, MD  insulin glargine (LANTUS) 100 UNIT/ML injection Inject 0.48 mLs (48 Units total) into the skin daily. 04/08/20   Clapacs, Madie Reno, MD  lamoTRIgine (LAMICTAL) 100 MG tablet Take 2 tablets (200 mg total) by mouth daily. 04/08/20   Clapacs, Madie Reno, MD  Lancets (ACCU-CHEK SOFT TOUCH) lancets Use as instructed 04/08/20   Clapacs, Madie Reno, MD  melatonin 5 MG TABS Take 1 tablet (5 mg total) by mouth at bedtime.  04/08/20   Clapacs, Madie Reno, MD  nystatin cream (MYCOSTATIN) Apply topically 2 (two) times daily. To affected area 04/08/20   Clapacs, Madie Reno, MD    Allergies Food  Family History  Problem Relation Age of Onset  . Stroke Mother   . Diabetes Other   . Hypertension Other   . Asthma Father   . Asthma Brother     Social History Social History   Tobacco Use  . Smoking status: Never Smoker  . Smokeless tobacco: Never Used  Vaping Use  . Vaping Use: Never used    Substance Use Topics  . Alcohol use: No  . Drug use: No    Comment: clean of cocaine x 3 yrs    Review of Systems  Constitutional: No fever/chills.  Positive generalized weakness Eyes: No visual changes. ENT: No sore throat. Cardiovascular: Positive for chest pain. Respiratory: Denies shortness of breath. Gastrointestinal: No abdominal pain.  No nausea, no vomiting.  No diarrhea.  No constipation. Genitourinary: Negative for dysuria.  Positive for polyuria and polydipsia Musculoskeletal: Negative for back pain. Skin: Negative for rash. Neurological: Negative for headaches, focal weakness or numbness.   ____________________________________________   PHYSICAL EXAM:  VITAL SIGNS: Vitals:   07/10/20 1121 07/10/20 1525  BP: (!) 143/93 (!) 127/91  Pulse: 83 87  Resp: 17 18  Temp: 98.7 F (37.1 C)   SpO2: 99% 97%      Constitutional: Alert and oriented. Well appearing and in no acute distress.  Obese.  Sitting up in bed well-appearing and conversational full sentences without distress. Eyes: Conjunctivae are normal. PERRL. EOMI. Head: Atraumatic. Nose: No congestion/rhinnorhea. Mouth/Throat: Mucous membranes are moist.  Oropharynx non-erythematous. Neck: No stridor. No cervical spine tenderness to palpation. Cardiovascular: Normal rate, regular rhythm. Grossly normal heart sounds.  Good peripheral circulation. Respiratory: Normal respiratory effort.  No retractions. Lungs CTAB. Gastrointestinal: Soft , nondistended, nontender to palpation. No abdominal bruits. No CVA tenderness. Musculoskeletal: No lower extremity tenderness nor edema.  No joint effusions. No signs of acute trauma. Neurologic:  Normal speech and language. No gross focal neurologic deficits are appreciated. No gait instability noted. Skin:  Skin is warm, dry and intact. No rash noted. Psychiatric: Mood and affect are normal. Speech and behavior are normal.  ____________________________________________    LABS (all labs ordered are listed, but only abnormal results are displayed)  Labs Reviewed  BASIC METABOLIC PANEL - Abnormal; Notable for the following components:      Result Value   Sodium 132 (*)    Chloride 95 (*)    Glucose, Bld 460 (*)    Creatinine, Ser 1.17 (*)    All other components within normal limits  CBC - Abnormal; Notable for the following components:   RBC 5.90 (*)    Hemoglobin 15.7 (*)    HCT 48.3 (*)    All other components within normal limits  URINALYSIS, COMPLETE (UACMP) WITH MICROSCOPIC - Abnormal; Notable for the following components:   Color, Urine YELLOW (*)    APPearance CLOUDY (*)    Specific Gravity, Urine 1.038 (*)    Glucose, UA >=500 (*)    Hgb urine dipstick LARGE (*)    Ketones, ur 20 (*)    Leukocytes,Ua LARGE (*)    RBC / HPF >50 (*)    Bacteria, UA RARE (*)    All other components within normal limits  GLUCOSE, CAPILLARY - Abnormal; Notable for the following components:   Glucose-Capillary 467 (*)    All other  components within normal limits  BLOOD GAS, VENOUS - Abnormal; Notable for the following components:   pO2, Ven 53.0 (*)    Bicarbonate 28.5 (*)    Acid-Base Excess 3.4 (*)    All other components within normal limits  GLUCOSE, CAPILLARY - Abnormal; Notable for the following components:   Glucose-Capillary 394 (*)    All other components within normal limits  URINE CULTURE  POC URINE PREG, ED  TROPONIN I (HIGH SENSITIVITY)  TROPONIN I (HIGH SENSITIVITY)   ____________________________________________  12 Lead EKG Sinus rhythm, rate of 93 bpm.  Rightward axis and normal intervals.  Isolated T wave inversions to leads III and aVF, as well as V3 and V4.  No ST elevations, depressions or evidence of acute ischemia.  No previous EKGs to compare to.  Patient is chest pain-free during acquisition of this EKG.  ____________________________________________  RADIOLOGY  ED MD interpretation: 2 view CXR without evidence of acute  cardiopulmonary pathology.  Official radiology report(s): DG Chest 2 View  Result Date: 07/10/2020 CLINICAL DATA:  Chest pain EXAM: CHEST - 2 VIEW COMPARISON:  10/13/2014 FINDINGS: The heart size and mediastinal contours are within normal limits. Low lung volumes with crowding of the bronchovascular markings. No focal airspace consolidation. No pleural effusion or pneumothorax. The visualized skeletal structures are unremarkable. IMPRESSION: Low lung volumes.  No acute cardiopulmonary findings. Electronically Signed   By: Davina Poke D.O.   On: 07/10/2020 11:59   ____________________________________________   PROCEDURES and INTERVENTIONS  Procedure(s) performed (including Critical Care):  Procedures  Medications  lactated ringers bolus 1,000 mL (0 mLs Intravenous Stopped 07/10/20 1457)    ____________________________________________   MDM / ED COURSE  24 year old diabetic presenting with hyperglycemia without evidence of DKA, and chest pain without evidence of ACS, amenable to outpatient management.  Normal vital signs on room air.  Exam is reassuring demonstrating an obese patient without distress.  Patient is pleasant and conversational full sentences, no Kussmaul breathing or signs of DKA.  Benign abdomen.  Blood work demonstrates hyperglycemia to the 400s without acidosis or evidence of DKA.  Nonischemic EKG and negative troponin.  Other EKG does have a couple T wave inversions, there is no previous to compare to and she is chest pain-free here in the ED.  She reports the pain is constant for a matter of days without exertional worsening and temporally associated with her hyperglycemia.  CXR demonstrates no radiographic evidence of cardiopulmonary pathology.  Patient bolused a liter of LR with improving blood glucose after this.  She is tolerating p.o. intake without complication. Regarding her UA, she has polyuria and polydipsia due to her hyperglycemia, which is understandable, and  she has no symptoms of UTI such as dysuria or foul-smelling urine.  We discussed her UA with some infectious features, but she does not feel like she has a UTI.  I sent this for a urine culture and we discussed return precautions for the ED regarding any symptoms of worsening UTI.  Patient medically stable for discharge home.  Clinical Course as of Jul 10 1717  Wed Jul 10, 2020  1505 Reassessed.  Patient tolerated p.o. intake without complication. After reviewing her urinalysis results, I further discussed with her her symptoms.  She denies dysuria or foul-smelling urine.  She reports polyuria and single episode of incontinence.  Shared decision-making on the utility of empiric antibiotics versus waiting for urine culture.  She is agreeable to wait for urine culture as she does not feel that she has  a UTI and that her symptoms are most likely due to her hyperglycemia, which I think is reasonable.   [DS]    Clinical Course User Index [DS] Vladimir Crofts, MD     ____________________________________________   FINAL CLINICAL IMPRESSION(S) / ED DIAGNOSES  Final diagnoses:  Hyperglycemia  Other chest pain  Dehydration  Polyuria  Polydipsia     ED Discharge Orders    None       Gricelda Foland Tamala Julian   Note:  This document was prepared using Dragon voice recognition software and may include unintentional dictation errors.   Vladimir Crofts, MD 07/10/20 [phone]

## 2020-07-10 NOTE — Discharge Instructions (Signed)
Please continue your normal care at home, including her insulin regimen.  Thankfully, there is no evidence of strain or damage in your heart from your chest pain.  If you develop any worsening sugar levels, particularly while feeling worse or throwing up, please return to the ED.

## 2020-07-11 LAB — URINE CULTURE: Culture: >=100000 — AB

## 2020-07-20 ENCOUNTER — Other Ambulatory Visit: Payer: Self-pay

## 2020-07-20 ENCOUNTER — Inpatient Hospital Stay
Admission: EM | Admit: 2020-07-20 | Discharge: 2020-07-25 | DRG: 177 | Disposition: A | Discharge status: Home / Self Care | Payer: Medicaid Other | Attending: Internal Medicine | Admitting: Internal Medicine

## 2020-07-20 DIAGNOSIS — E1110 Type 2 diabetes mellitus with ketoacidosis without coma: Secondary | ICD-10-CM | POA: Diagnosis present

## 2020-07-20 DIAGNOSIS — B951 Streptococcus, group B, as the cause of diseases classified elsewhere: Secondary | ICD-10-CM | POA: Diagnosis present

## 2020-07-20 DIAGNOSIS — F064 Anxiety disorder due to known physiological condition: Secondary | ICD-10-CM | POA: Diagnosis present

## 2020-07-20 DIAGNOSIS — E11649 Type 2 diabetes mellitus with hypoglycemia without coma: Secondary | ICD-10-CM | POA: Diagnosis not present

## 2020-07-20 DIAGNOSIS — U071 COVID-19: Principal | ICD-10-CM | POA: Diagnosis present

## 2020-07-20 DIAGNOSIS — Z6841 Body Mass Index (BMI) 40.0 and over, adult: Secondary | ICD-10-CM

## 2020-07-20 DIAGNOSIS — R059 Cough, unspecified: Secondary | ICD-10-CM

## 2020-07-20 DIAGNOSIS — Z79899 Other long term (current) drug therapy: Secondary | ICD-10-CM

## 2020-07-20 DIAGNOSIS — N390 Urinary tract infection, site not specified: Secondary | ICD-10-CM

## 2020-07-20 DIAGNOSIS — R45851 Suicidal ideations: Secondary | ICD-10-CM

## 2020-07-20 DIAGNOSIS — Z794 Long term (current) use of insulin: Secondary | ICD-10-CM

## 2020-07-20 DIAGNOSIS — J1282 Pneumonia due to coronavirus disease 2019: Secondary | ICD-10-CM

## 2020-07-20 DIAGNOSIS — F209 Schizophrenia, unspecified: Secondary | ICD-10-CM | POA: Diagnosis present

## 2020-07-20 DIAGNOSIS — Z8659 Personal history of other mental and behavioral disorders: Secondary | ICD-10-CM

## 2020-07-20 DIAGNOSIS — Z825 Family history of asthma and other chronic lower respiratory diseases: Secondary | ICD-10-CM

## 2020-07-20 DIAGNOSIS — Z823 Family history of stroke: Secondary | ICD-10-CM

## 2020-07-20 DIAGNOSIS — N179 Acute kidney failure, unspecified: Secondary | ICD-10-CM | POA: Diagnosis not present

## 2020-07-20 DIAGNOSIS — F319 Bipolar disorder, unspecified: Secondary | ICD-10-CM | POA: Diagnosis present

## 2020-07-20 LAB — GLUCOSE, CAPILLARY
Glucose-Capillary: 421 mg/dL — ABNORMAL HIGH (ref 70–99)
Glucose-Capillary: 523 mg/dL (ref 70–99)
Glucose-Capillary: >600 mg/dL (ref 70–99)

## 2020-07-20 LAB — URINE DRUG SCREEN, QUALITATIVE (ARMC ONLY)
Amphetamines, Ur Screen: NOT DETECTED
Barbiturates, Ur Screen: NOT DETECTED
Benzodiazepine, Ur Scrn: NOT DETECTED
Cannabinoid 50 Ng, Ur ~~LOC~~: NOT DETECTED
Cocaine Metabolite,Ur ~~LOC~~: NOT DETECTED
MDMA (Ecstasy)Ur Screen: NOT DETECTED
Methadone Scn, Ur: NOT DETECTED
Opiate, Ur Screen: NOT DETECTED
Phencyclidine (PCP) Ur S: NOT DETECTED
Tricyclic, Ur Screen: NOT DETECTED

## 2020-07-20 LAB — CBC
HCT: 46.3 % — ABNORMAL HIGH (ref 36.0–46.0)
Hemoglobin: 15.3 g/dL — ABNORMAL HIGH (ref 12.0–15.0)
MCH: 26.9 pg (ref 26.0–34.0)
MCHC: 33 g/dL (ref 30.0–36.0)
MCV: 81.5 fL (ref 80.0–100.0)
Platelets: 198 10*3/uL (ref 150–400)
RBC: 5.68 MIL/uL — ABNORMAL HIGH (ref 3.87–5.11)
RDW: 14.7 % (ref 11.5–15.5)
WBC: 6.6 10*3/uL (ref 4.0–10.5)
nRBC: 0 % (ref 0.0–0.2)

## 2020-07-20 LAB — BLOOD GAS, VENOUS
Acid-Base Excess: 1.2 mmol/L (ref 0.0–2.0)
Bicarbonate: 27.2 mmol/L (ref 20.0–28.0)
FIO2: 0.21
O2 Saturation: 63.3 %
Patient temperature: 37
pCO2, Ven: 47 mmHg (ref 44.0–60.0)
pH, Ven: 7.37 (ref 7.250–7.430)
pO2, Ven: 34 mmHg (ref 32.0–45.0)

## 2020-07-20 LAB — BASIC METABOLIC PANEL
Anion gap: 16 — ABNORMAL HIGH (ref 5–15)
BUN: 20 mg/dL (ref 6–20)
CO2: 24 mmol/L (ref 22–32)
Calcium: 9.3 mg/dL (ref 8.9–10.3)
Chloride: 92 mmol/L — ABNORMAL LOW (ref 98–111)
Creatinine, Ser: 1.29 mg/dL — ABNORMAL HIGH (ref 0.44–1.00)
GFR calc Af Amer: >60 mL/min (ref >60)
GFR calc non Af Amer: 57 mL/min — ABNORMAL LOW (ref >60)
Glucose, Bld: 798 mg/dL (ref 70–99)
Potassium: 4.4 mmol/L (ref 3.5–5.1)
Sodium: 132 mmol/L — ABNORMAL LOW (ref 135–145)

## 2020-07-20 LAB — URINALYSIS, COMPLETE (UACMP) WITH MICROSCOPIC
Bacteria, UA: NONE SEEN
Bilirubin Urine: NEGATIVE
Glucose, UA: >=500 mg/dL — AB
Hgb urine dipstick: NEGATIVE
Ketones, ur: 20 mg/dL — AB
Nitrite: NEGATIVE
Protein, ur: NEGATIVE mg/dL
Specific Gravity, Urine: 1.033 — ABNORMAL HIGH (ref 1.005–1.030)
pH: 6 (ref 5.0–8.0)

## 2020-07-20 LAB — POCT PREGNANCY, URINE: Preg Test, Ur: NEGATIVE

## 2020-07-20 LAB — SALICYLATE LEVEL: Salicylate Lvl: <7 mg/dL — ABNORMAL LOW (ref 7.0–30.0)

## 2020-07-20 LAB — ETHANOL: Alcohol, Ethyl (B): <10 mg/dL (ref <10)

## 2020-07-20 LAB — ACETAMINOPHEN LEVEL: Acetaminophen (Tylenol), Serum: <10 ug/mL — ABNORMAL LOW (ref 10–30)

## 2020-07-20 MED ORDER — SODIUM CHLORIDE 0.9 % IV BOLUS: 1000.0000 mL | Freq: Once | INTRAVENOUS | Status: DC

## 2020-07-20 MED ORDER — INSULIN ASPART 100 UNIT/ML ~~LOC~~ SOLN
SUBCUTANEOUS | Status: AC
  Administered 2020-07-21: 4 [IU] via SUBCUTANEOUS
  Filled 2020-07-20: qty 1

## 2020-07-20 MED ORDER — INSULIN GLARGINE 100 UNIT/ML ~~LOC~~ SOLN
48.0000 [IU] | Freq: Once | SUBCUTANEOUS | Status: AC
  Administered 2020-07-21: 48 [IU] via SUBCUTANEOUS
  Filled 2020-07-20: qty 0.48

## 2020-07-20 MED ORDER — INSULIN ASPART 100 UNIT/ML ~~LOC~~ SOLN
12.0000 [IU] | Freq: Once | SUBCUTANEOUS | Status: AC
  Administered 2020-07-21: 12 [IU] via INTRAVENOUS

## 2020-07-20 MED ORDER — INSULIN GLARGINE 100 UNIT/ML ~~LOC~~ SOLN: 12.0000 [IU] | Freq: Once | SUBCUTANEOUS | Status: DC

## 2020-07-20 MED ORDER — SODIUM CHLORIDE 0.9 % IV BOLUS
1000.0000 mL | Freq: Once | INTRAVENOUS | Status: AC
  Administered 2020-07-20: 1000 mL via INTRAVENOUS

## 2020-07-20 MED ORDER — INSULIN ASPART 100 UNIT/ML ~~LOC~~ SOLN
10.0000 [IU] | Freq: Once | SUBCUTANEOUS | Status: AC
  Administered 2020-07-20: 10 [IU] via INTRAVENOUS
  Filled 2020-07-20: qty 1

## 2020-07-20 NOTE — ED Triage Notes (Signed)
Pt comes POV with hyperglycemia. Pt reading was high this morning and high now. Pt states increased urination and general malaise. Pt dx with type 2 a couple of months ago. Trying to learn to get it under control with meds but not working.

## 2020-07-20 NOTE — ED Notes (Addendum)
Pt dressed out in by this RN and EDT Misty Stanley May, pt belongings include cell phone, white t-shirt, black slides, PJ bottom, cloth mask.

## 2020-07-20 NOTE — ED Notes (Signed)
Pt given a cup of ice water with no lid or straw.

## 2020-07-20 NOTE — ED Notes (Signed)
Pt stopped me in the hallway and stated " I am bi polar and Im having suicidal thoughts".  Consulting civil engineer notified.  Pt being dressed out and waiting for medical clearance.

## 2020-07-20 NOTE — ED Provider Notes (Signed)
[facility] Emergency Department Provider Note  ____________________________________________   First MD Initiated Contact with Patient 07/20/20 2047     (approximate)  I have reviewed the triage vital signs and the nursing notes.   HISTORY  Chief Complaint Hyperglycemia    HPI Cindy Leonard is a 24 y.o. female  With schizoaffective d/o, dm2, here with multiple complaints. Pt states that her sugar has been "high" for a few days - unclear why, states she has been taking her meds and eating a "better" diet. She reports she's had associated nausea, fatigue, polyuria, polydipsia. She states she has felt fatigued, weak with this and subsequently had worsening depression. Her mood has been poor and she has felt like wanting to kill herself over the past 24 hours. She tried to jump into traffic today per her report but her friends stopped her. No other complaints.    Past Medical History:  Diagnosis Date  . Bipolar 1 disorder ([place])   . Depression   . Schizophrenia [facility])     Patient Active Problem List   Diagnosis Date Noted  . Homicidal ideation   . Bipolar affective disorder, current episode manic ([place]) 04/04/2020  . Diabetes ([place]) 04/04/2020  . DKA (diabetic ketoacidoses) ([place]) 03/28/2020  . Depression 03/28/2020  . Hyponatremia 03/28/2020  . Elevated blood pressure reading 09/14/2018  . Acne 09/14/2018  . Morbid obesity ([place]) 03/22/2017  . Hx of bipolar disorder 03/22/2017  . Schizophrenia ([place]) 03/22/2017    History reviewed. No pertinent surgical history.  Prior to Admission medications   Medication Sig Start Date End Date Taking? Authorizing Provider  Blood Glucose Monitoring Suppl (ACCU-CHEK AVIVA PLUS) w/Device KIT Use as directed 04/08/20   Clapacs, Madie Reno, MD  clonazePAM (KLONOPIN) 0.25 MG disintegrating tablet Take 1 tablet (0.25 mg total) by mouth 2 (two) times daily. 04/08/20   Clapacs, Madie Reno, MD  docusate sodium (COLACE) 100 MG capsule  Take 1 capsule (100 mg total) by mouth 2 (two) times daily. 04/08/20   Clapacs, Madie Reno, MD  glucose blood (ACCU-CHEK AVIVA PLUS) test strip Use as instructed 04/08/20   Clapacs, Madie Reno, MD  haloperidol (HALDOL) 5 MG tablet Take 1 tablet (5 mg total) by mouth at bedtime. 04/08/20   Clapacs, Madie Reno, MD  insulin aspart (NOVOLOG) 100 UNIT/ML injection Inject 12 Units into the skin 3 (three) times daily with meals. 04/08/20   Clapacs, Madie Reno, MD  insulin glargine (LANTUS) 100 UNIT/ML injection Inject 0.48 mLs (48 Units total) into the skin daily. 04/08/20   Clapacs, Madie Reno, MD  lamoTRIgine (LAMICTAL) 100 MG tablet Take 2 tablets (200 mg total) by mouth daily. 04/08/20   Clapacs, Madie Reno, MD  Lancets (ACCU-CHEK SOFT TOUCH) lancets Use as instructed 04/08/20   Clapacs, Madie Reno, MD  melatonin 5 MG TABS Take 1 tablet (5 mg total) by mouth at bedtime. 04/08/20   Clapacs, Madie Reno, MD  nystatin cream (MYCOSTATIN) Apply topically 2 (two) times daily. To affected area 04/08/20   Clapacs, Madie Reno, MD    Allergies Food  Family History  Problem Relation Age of Onset  . Stroke Mother   . Diabetes Other   . Hypertension Other   . Asthma Father   . Asthma Brother     Social History Social History   Tobacco Use  . Smoking status: Never Smoker  . Smokeless tobacco: Never Used  Vaping Use  . Vaping Use: Never used  Substance Use Topics  .  Alcohol use: No  . Drug use: No    Comment: clean of cocaine x 3 yrs    Review of Systems  Review of Systems  Constitutional: Positive for fatigue. Negative for chills and fever.  HENT: Negative for sore throat.   Respiratory: Negative for shortness of breath.   Cardiovascular: Negative for chest pain.  Gastrointestinal: Negative for abdominal pain.  Endocrine: Positive for polydipsia, polyphagia and polyuria.  Genitourinary: Negative for flank pain.  Musculoskeletal: Negative for neck pain.  Skin: Negative for rash and wound.  Allergic/Immunologic: Negative for  immunocompromised state.  Neurological: Positive for weakness. Negative for numbness.  Hematological: Does not bruise/bleed easily.  Psychiatric/Behavioral: Positive for dysphoric mood.  All other systems reviewed and are negative.    ____________________________________________  PHYSICAL EXAM:      VITAL SIGNS: ED Triage Vitals  Enc Vitals Group     BP 07/20/20 1747 (!) 145/104     Pulse Rate 07/20/20 1747 (!) 116     Resp 07/20/20 1747 18     Temp 07/20/20 1747 98.3 F (36.8 C)     Temp Source 07/20/20 1747 Oral     SpO2 07/20/20 1747 97 %     Weight 07/20/20 1748 299 lb (135.6 kg)     Height 07/20/20 1748 '5\' 7"'  (1.702 m)     Head Circumference --      Peak Flow --      Pain Score 07/20/20 1753 10     Pain Loc --      Pain Edu? --      Excl. in [place]? --      Physical Exam Vitals and nursing note reviewed.  Constitutional:      General: She is not in acute distress.    Appearance: She is well-developed.  HENT:     Head: Normocephalic and atraumatic.     Mouth/Throat:     Mouth: Mucous membranes are dry.  Eyes:     Conjunctiva/sclera: Conjunctivae normal.  Cardiovascular:     Rate and Rhythm: Normal rate and regular rhythm.     Heart sounds: Normal heart sounds. No murmur heard.  No friction rub.  Pulmonary:     Effort: Pulmonary effort is normal. No respiratory distress.     Breath sounds: Normal breath sounds. No wheezing or rales.  Abdominal:     General: There is no distension.     Palpations: Abdomen is soft.     Tenderness: There is no abdominal tenderness.  Musculoskeletal:     Cervical back: Neck supple.  Skin:    General: Skin is warm.     Capillary Refill: Capillary refill takes less than 2 seconds.  Neurological:     Mental Status: She is alert and oriented to person, place, and time.     Motor: No abnormal muscle tone.  Psychiatric:        Mood and Affect: Mood is depressed.        Thought Content: Thought content includes suicidal ideation.         ____________________________________________   LABS (all labs ordered are listed, but only abnormal results are displayed)  Labs Reviewed  GLUCOSE, CAPILLARY - Abnormal; Notable for the following components:      Result Value   Glucose-Capillary >600 (*)    All other components within normal limits  BASIC METABOLIC PANEL - Abnormal; Notable for the following components:   Sodium 132 (*)    Chloride 92 (*)    Glucose, Bld 798 (*)  Creatinine, Ser 1.29 (*)    GFR calc non Af Amer 57 (*)    Anion gap 16 (*)    All other components within normal limits  CBC - Abnormal; Notable for the following components:   RBC 5.68 (*)    Hemoglobin 15.3 (*)    HCT 46.3 (*)    All other components within normal limits  URINALYSIS, COMPLETE (UACMP) WITH MICROSCOPIC - Abnormal; Notable for the following components:   Color, Urine STRAW (*)    APPearance CLEAR (*)    Specific Gravity, Urine 1.033 (*)    Glucose, UA >=500 (*)    Ketones, ur 20 (*)    Leukocytes,Ua TRACE (*)    All other components within normal limits  GLUCOSE, CAPILLARY - Abnormal; Notable for the following components:   Glucose-Capillary 523 (*)    All other components within normal limits  BLOOD GAS, VENOUS  ETHANOL  SALICYLATE LEVEL  ACETAMINOPHEN LEVEL  URINE DRUG SCREEN, QUALITATIVE ([facility] ONLY)  CBG MONITORING, ED  POC URINE PREG, ED  POCT PREGNANCY, URINE  CBG MONITORING, ED    ____________________________________________  EKG: Pending ________________________________________  RADIOLOGY All imaging, including plain films, CT scans, and ultrasounds, independently reviewed by me, and interpretations confirmed via formal radiology reads.  ED MD interpretation:   None  Official radiology report(s): No results found.  ____________________________________________  PROCEDURES   Procedure(s) performed (including Critical  Care):  Procedures  ____________________________________________  INITIAL IMPRESSION / MDM / [place] / ED COURSE  As part of my medical decision making, I reviewed the following data within the [place] notes reviewed and incorporated, Old chart reviewed, Notes from prior ED visits, and [place] Controlled Substance Database       *Cindy Leonard was evaluated in Emergency Department on 07/20/2020 for the symptoms described in the history of present illness. She was evaluated in the context of the global COVID-19 pandemic, which necessitated consideration that the patient might be at risk for infection with the SARS-CoV-2 virus that causes COVID-19. Institutional protocols and algorithms that pertain to the evaluation of patients at risk for COVID-19 are in a state of rapid change based on information released by regulatory bodies including the CDC and federal and state organizations. These policies and algorithms were followed during the patient's care in the ED.  Some ED evaluations and interventions may be delayed as a result of limited staffing during the pandemic.*  Clinical Course as of Jul 21 2055  Sat Jul 20, 2020  1753 Glucose-Capillary(!!): >600 [EU]    Clinical Course User Index [EU] Willaim Rayas, Student-PA    Medical Decision Making:  23 yo F here with hyperglycemia, weakness, and suicidal ideation. Reports her SI is 2/2 her underlying medical disease/hyperglycemia. Denies any recent infection or other apparent triggers for hyperglycemia.  CO2 normal, but BG markedly elevated. AG minimally elevated at 16. Doubt significant DKA, only 20 ketones in urine and UA does show possible UTI. Given that pt will be in the ED for psych clearance, will repeat BMP. If it's stable, can be psych dispo. If any changes, will need med admission.  ____________________________________________  FINAL CLINICAL IMPRESSION(S) / ED DIAGNOSES  Final  diagnoses:  None     MEDICATIONS GIVEN DURING THIS VISIT:  Medications  sodium chloride 0.9 % bolus 1,000 mL (has no administration in time range)  insulin aspart (novoLOG) injection 10 Units (has no administration in time range)  sodium chloride 0.9 % bolus 1,000 mL (  0 mLs Intravenous Stopped 07/20/20 1903)     ED Discharge Orders    None       Note:  This document was prepared using Dragon voice recognition software and may include unintentional dictation errors.   Duffy Bruce, MD 07/21/20 [phone]

## 2020-07-20 NOTE — ED Notes (Signed)
Informed Dr. Lenard Lance pt was making statements about being bipolar and having suicidal thoughts. Informed him that patient is the next for a medical bed and that we have dressed her out. Advised him due to her not being in a bed that we would hold the IVF and insulin until she can be monitored more closely.

## 2020-07-21 ENCOUNTER — Encounter: Payer: Self-pay | Admitting: Internal Medicine

## 2020-07-21 ENCOUNTER — Other Ambulatory Visit: Payer: Self-pay

## 2020-07-21 DIAGNOSIS — R45851 Suicidal ideations: Secondary | ICD-10-CM

## 2020-07-21 DIAGNOSIS — N179 Acute kidney failure, unspecified: Secondary | ICD-10-CM | POA: Diagnosis not present

## 2020-07-21 DIAGNOSIS — N390 Urinary tract infection, site not specified: Secondary | ICD-10-CM | POA: Diagnosis present

## 2020-07-21 DIAGNOSIS — Z794 Long term (current) use of insulin: Secondary | ICD-10-CM | POA: Diagnosis not present

## 2020-07-21 DIAGNOSIS — E1110 Type 2 diabetes mellitus with ketoacidosis without coma: Secondary | ICD-10-CM

## 2020-07-21 DIAGNOSIS — Z8659 Personal history of other mental and behavioral disorders: Secondary | ICD-10-CM

## 2020-07-21 DIAGNOSIS — U071 COVID-19: Secondary | ICD-10-CM | POA: Diagnosis present

## 2020-07-21 DIAGNOSIS — F209 Schizophrenia, unspecified: Secondary | ICD-10-CM | POA: Diagnosis present

## 2020-07-21 DIAGNOSIS — Z825 Family history of asthma and other chronic lower respiratory diseases: Secondary | ICD-10-CM | POA: Diagnosis not present

## 2020-07-21 DIAGNOSIS — E11649 Type 2 diabetes mellitus with hypoglycemia without coma: Secondary | ICD-10-CM | POA: Diagnosis not present

## 2020-07-21 DIAGNOSIS — B951 Streptococcus, group B, as the cause of diseases classified elsewhere: Secondary | ICD-10-CM | POA: Diagnosis present

## 2020-07-21 DIAGNOSIS — Z6841 Body Mass Index (BMI) 40.0 and over, adult: Secondary | ICD-10-CM | POA: Diagnosis not present

## 2020-07-21 DIAGNOSIS — F319 Bipolar disorder, unspecified: Secondary | ICD-10-CM | POA: Diagnosis present

## 2020-07-21 DIAGNOSIS — Z823 Family history of stroke: Secondary | ICD-10-CM | POA: Diagnosis not present

## 2020-07-21 DIAGNOSIS — Z79899 Other long term (current) drug therapy: Secondary | ICD-10-CM | POA: Diagnosis not present

## 2020-07-21 DIAGNOSIS — F064 Anxiety disorder due to known physiological condition: Secondary | ICD-10-CM | POA: Diagnosis present

## 2020-07-21 DIAGNOSIS — J1282 Pneumonia due to coronavirus disease 2019: Secondary | ICD-10-CM | POA: Diagnosis not present

## 2020-07-21 LAB — GLUCOSE, CAPILLARY
Glucose-Capillary: 422 mg/dL — ABNORMAL HIGH (ref 70–99)
Glucose-Capillary: 440 mg/dL — ABNORMAL HIGH (ref 70–99)
Glucose-Capillary: 323 mg/dL — ABNORMAL HIGH (ref 70–99)
Glucose-Capillary: 326 mg/dL — ABNORMAL HIGH (ref 70–99)
Glucose-Capillary: 248 mg/dL — ABNORMAL HIGH (ref 70–99)
Glucose-Capillary: 252 mg/dL — ABNORMAL HIGH (ref 70–99)
Glucose-Capillary: 198 mg/dL — ABNORMAL HIGH (ref 70–99)
Glucose-Capillary: 309 mg/dL — ABNORMAL HIGH (ref 70–99)
Glucose-Capillary: 219 mg/dL — ABNORMAL HIGH (ref 70–99)
Glucose-Capillary: 304 mg/dL — ABNORMAL HIGH (ref 70–99)

## 2020-07-21 LAB — BASIC METABOLIC PANEL
Anion gap: 10 (ref 5–15)
Anion gap: 17 — ABNORMAL HIGH (ref 5–15)
BUN: 11 mg/dL (ref 6–20)
BUN: 16 mg/dL (ref 6–20)
CO2: 20 mmol/L — ABNORMAL LOW (ref 22–32)
CO2: 24 mmol/L (ref 22–32)
Calcium: 7.2 mg/dL — ABNORMAL LOW (ref 8.9–10.3)
Calcium: 8.7 mg/dL — ABNORMAL LOW (ref 8.9–10.3)
Chloride: 100 mmol/L (ref 98–111)
Chloride: 109 mmol/L (ref 98–111)
Creatinine, Ser: 0.82 mg/dL (ref 0.44–1.00)
Creatinine, Ser: 1.11 mg/dL — ABNORMAL HIGH (ref 0.44–1.00)
GFR calc Af Amer: >60 mL/min (ref >60)
GFR calc Af Amer: >60 mL/min (ref >60)
GFR calc non Af Amer: >60 mL/min (ref >60)
GFR calc non Af Amer: >60 mL/min (ref >60)
Glucose, Bld: 287 mg/dL — ABNORMAL HIGH (ref 70–99)
Glucose, Bld: 433 mg/dL — ABNORMAL HIGH (ref 70–99)
Potassium: 4 mmol/L (ref 3.5–5.1)
Potassium: 4.3 mmol/L (ref 3.5–5.1)
Sodium: 139 mmol/L (ref 135–145)
Sodium: 141 mmol/L (ref 135–145)

## 2020-07-21 LAB — CBC
HCT: 42.7 % (ref 36.0–46.0)
Hemoglobin: 13.9 g/dL (ref 12.0–15.0)
MCH: 26.4 pg (ref 26.0–34.0)
MCHC: 32.6 g/dL (ref 30.0–36.0)
MCV: 81 fL (ref 80.0–100.0)
Platelets: 179 10*3/uL (ref 150–400)
RBC: 5.27 MIL/uL — ABNORMAL HIGH (ref 3.87–5.11)
RDW: 14.5 % (ref 11.5–15.5)
WBC: 6.2 10*3/uL (ref 4.0–10.5)
nRBC: 0 % (ref 0.0–0.2)

## 2020-07-21 LAB — BETA-HYDROXYBUTYRIC ACID
Beta-Hydroxybutyric Acid: 2.42 mmol/L — ABNORMAL HIGH (ref 0.05–0.27)
Beta-Hydroxybutyric Acid: 1.2 mmol/L — ABNORMAL HIGH (ref 0.05–0.27)

## 2020-07-21 LAB — HEMOGLOBIN A1C
Hgb A1c MFr Bld: 14.7 % — ABNORMAL HIGH (ref 4.8–5.6)
Mean Plasma Glucose: 375.19 mg/dL

## 2020-07-21 LAB — SARS CORONAVIRUS 2 BY RT PCR (HOSPITAL ORDER, PERFORMED IN ~~LOC~~ HOSPITAL LAB): SARS Coronavirus 2: POSITIVE — AB

## 2020-07-21 MED ORDER — INSULIN REGULAR(HUMAN) IN NACL 100-0.9 UT/100ML-% IV SOLN
INTRAVENOUS | Status: DC
  Filled 2020-07-21: qty 100

## 2020-07-21 MED ORDER — ALUM & MAG HYDROXIDE-SIMETH 200-200-20 MG/5ML PO SUSP: 30.0000 mL | Freq: Four times a day (QID) | ORAL | Status: DC | PRN

## 2020-07-21 MED ORDER — LACTATED RINGERS IV SOLN: INTRAVENOUS | Status: DC

## 2020-07-21 MED ORDER — DIPHENHYDRAMINE HCL 50 MG/ML IJ SOLN: 50.0000 mg | Freq: Once | INTRAMUSCULAR | Status: DC | PRN

## 2020-07-21 MED ORDER — CLONAZEPAM 0.25 MG PO TBDP
0.2500 mg | ORAL_TABLET | Freq: Two times a day (BID) | ORAL | Status: DC
  Administered 2020-07-21 – 2020-07-22 (×4): 0.25 mg via ORAL
  Filled 2020-07-21 (×4): qty 1

## 2020-07-21 MED ORDER — INSULIN REGULAR(HUMAN) IN NACL 100-0.9 UT/100ML-% IV SOLN
INTRAVENOUS | Status: DC
  Administered 2020-07-21: 6.5 [IU]/h via INTRAVENOUS
  Filled 2020-07-21: qty 100

## 2020-07-21 MED ORDER — ACETAMINOPHEN 325 MG PO TABS: 650.0000 mg | ORAL_TABLET | ORAL | Status: DC | PRN

## 2020-07-21 MED ORDER — DOCUSATE SODIUM 100 MG PO CAPS
100.0000 mg | ORAL_CAPSULE | Freq: Two times a day (BID) | ORAL | Status: DC
  Administered 2020-07-21 – 2020-07-25 (×6): 100 mg via ORAL
  Filled 2020-07-21 (×8): qty 1

## 2020-07-21 MED ORDER — SODIUM CHLORIDE 0.9 % IV SOLN
2.0000 g | Freq: Once | INTRAVENOUS | Status: AC
  Administered 2020-07-21: 2 g via INTRAVENOUS
  Filled 2020-07-21: qty 20

## 2020-07-21 MED ORDER — LACTATED RINGERS IV BOLUS: 20.0000 mL/kg | Freq: Once | INTRAVENOUS | Status: DC

## 2020-07-21 MED ORDER — FAMOTIDINE IN NACL 20-0.9 MG/50ML-% IV SOLN: 20.0000 mg | Freq: Once | INTRAVENOUS | Status: DC | PRN

## 2020-07-21 MED ORDER — POTASSIUM CHLORIDE 10 MEQ/100ML IV SOLN
10.0000 meq | INTRAVENOUS | Status: DC
  Administered 2020-07-21: 10 meq via INTRAVENOUS
  Filled 2020-07-21: qty 100

## 2020-07-21 MED ORDER — LAMOTRIGINE 100 MG PO TABS
200.0000 mg | ORAL_TABLET | Freq: Every day | ORAL | Status: DC
  Administered 2020-07-21 – 2020-07-25 (×5): 200 mg via ORAL
  Filled 2020-07-21 (×5): qty 2

## 2020-07-21 MED ORDER — ENOXAPARIN SODIUM 40 MG/0.4ML ~~LOC~~ SOLN
40.0000 mg | Freq: Two times a day (BID) | SUBCUTANEOUS | Status: DC
  Administered 2020-07-21 – 2020-07-25 (×9): 40 mg via SUBCUTANEOUS
  Filled 2020-07-21 (×9): qty 0.4

## 2020-07-21 MED ORDER — DEXTROSE IN LACTATED RINGERS 5 % IV SOLN: INTRAVENOUS | Status: DC

## 2020-07-21 MED ORDER — ALBUTEROL SULFATE HFA 108 (90 BASE) MCG/ACT IN AERS
2.0000 | INHALATION_SPRAY | Freq: Once | RESPIRATORY_TRACT | Status: DC | PRN
  Filled 2020-07-21: qty 6.7

## 2020-07-21 MED ORDER — INSULIN GLARGINE 100 UNIT/ML ~~LOC~~ SOLN
48.0000 [IU] | Freq: Every day | SUBCUTANEOUS | Status: DC
  Filled 2020-07-21: qty 0.48

## 2020-07-21 MED ORDER — INSULIN ASPART 100 UNIT/ML ~~LOC~~ SOLN
12.0000 [IU] | Freq: Three times a day (TID) | SUBCUTANEOUS | Status: DC
  Administered 2020-07-21 – 2020-07-23 (×5): 12 [IU] via SUBCUTANEOUS
  Filled 2020-07-21 (×6): qty 1

## 2020-07-21 MED ORDER — EPINEPHRINE 0.3 MG/0.3ML IJ SOAJ
0.3000 mg | Freq: Once | INTRAMUSCULAR | Status: DC | PRN
  Filled 2020-07-21: qty 0.3

## 2020-07-21 MED ORDER — DEXTROSE 50 % IV SOLN: 0.0000 mL | INTRAVENOUS | Status: DC | PRN

## 2020-07-21 MED ORDER — SODIUM CHLORIDE 0.9 % IV SOLN: INTRAVENOUS | Status: DC | PRN

## 2020-07-21 MED ORDER — METHYLPREDNISOLONE SODIUM SUCC 125 MG IJ SOLR: 125.0000 mg | Freq: Once | INTRAMUSCULAR | Status: DC | PRN

## 2020-07-21 MED ORDER — LACTATED RINGERS IV BOLUS
1712.0000 mL | Freq: Once | INTRAVENOUS | Status: AC
  Administered 2020-07-21: 1712 mL via INTRAVENOUS

## 2020-07-21 MED ORDER — ONDANSETRON HCL 4 MG PO TABS: 4.0000 mg | ORAL_TABLET | Freq: Three times a day (TID) | ORAL | Status: DC | PRN

## 2020-07-21 MED ORDER — MELATONIN 5 MG PO TABS
5.0000 mg | ORAL_TABLET | Freq: Every day | ORAL | Status: DC
  Administered 2020-07-21 – 2020-07-24 (×5): 5 mg via ORAL
  Filled 2020-07-21 (×7): qty 1

## 2020-07-21 MED ORDER — HALOPERIDOL 5 MG PO TABS
5.0000 mg | ORAL_TABLET | Freq: Every day | ORAL | Status: DC
  Administered 2020-07-21 (×2): 5 mg via ORAL
  Filled 2020-07-21 (×3): qty 1

## 2020-07-21 MED ORDER — INSULIN ASPART 100 UNIT/ML ~~LOC~~ SOLN
0.0000 [IU] | Freq: Three times a day (TID) | SUBCUTANEOUS | Status: DC
  Administered 2020-07-21: 17:00:00 7 [IU] via SUBCUTANEOUS
  Administered 2020-07-22: 12:00:00 15 [IU] via SUBCUTANEOUS
  Administered 2020-07-22: 17:00:00 7 [IU] via SUBCUTANEOUS
  Administered 2020-07-22 – 2020-07-23 (×2): 15 [IU] via SUBCUTANEOUS
  Administered 2020-07-23: 4 [IU] via SUBCUTANEOUS
  Administered 2020-07-23: 09:00:00 11 [IU] via SUBCUTANEOUS
  Administered 2020-07-24: 12:00:00 15 [IU] via SUBCUTANEOUS
  Administered 2020-07-24: 11 [IU] via SUBCUTANEOUS
  Administered 2020-07-24: 18:00:00 7 [IU] via SUBCUTANEOUS
  Administered 2020-07-25: 12:00:00 11 [IU] via SUBCUTANEOUS
  Administered 2020-07-25: 4 [IU] via SUBCUTANEOUS
  Filled 2020-07-21 (×12): qty 1

## 2020-07-21 MED ORDER — SODIUM CHLORIDE 0.9 % IV SOLN
1200.0000 mg | Freq: Once | INTRAVENOUS | Status: AC
  Administered 2020-07-21: 1200 mg via INTRAVENOUS
  Filled 2020-07-21: qty 10

## 2020-07-21 NOTE — ED Notes (Addendum)
Pt noted to have urinated in bed, while sleeping. Pt changed into a new gown. Pt on toilet attempting to urinate some more. Bed clean and new linen placed. Pt is steady on her feet, with no assistance. HOB elevated to approximately 60-75 degrees.
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
## 2020-07-21 NOTE — H&P (Signed)
History and Physical    Cindy Leonard VCB:[id number] DOB: October 11, 1996 DOA: 07/20/2020  PCP: Patient, No Pcp Per   Patient coming from: Home  I have personally briefly reviewed patient's old medical records in [place]  Chief Complaint: Hyperglycemia, malaise, suicidal ideation  HPI: Cindy Leonard is a 24 y.o. female with medical history significant for bipolar mood disorder, schizophrenia, morbid obesity and poorly controlled type 2 diabetes, hospitalized in May for DKA who presents to the emergency room with complaints of malaise, frequent urination and high blood sugar readings.  Patient initially arrived voluntarily but then expressed that she tried to jump in front of car earlier on her friend preventing her from doing so and she was subsequently IVC.  She denies any other complaints.  Patient takes medication as prescribed to the best of her ability but blood sugar readings are still high.  She otherwise denies any complaints and denies chest pain, shortness of breath, fever, chills, cough, headache or abdominal pain.  She was assessed by behavioral health while in the emergency room ED Course: On arrival she was slightly hypertensive at 145/104, tachypneic at 116 with otherwise normal vitals.  Blood work notable for blood sugar of 798 with an anion gap of 16 which did not improve after IV fluid hydration.  Urine drug screen negative and alcohol level less than 10.  Patient was started on an insulin infusion.  Hospitalist consulted for admission   Review of Systems: As per HPI otherwise all other systems on review of systems negative.    Past Medical History:  Diagnosis Date  . Bipolar 1 disorder ([place])   . Depression   . Schizophrenia ([place])     History reviewed. No pertinent surgical history.   reports that she has never smoked. She has never used smokeless tobacco. She reports that she does not drink alcohol and does not use drugs.  Allergies  Allergen Reactions  .  Food Swelling    Eyes/throat swelling (blueberries)    Family History  Problem Relation Age of Onset  . Stroke Mother   . Diabetes Other   . Hypertension Other   . Asthma Father   . Asthma Brother       Prior to Admission medications   Medication Sig Start Date End Date Taking? Authorizing Provider  Blood Glucose Monitoring Suppl (ACCU-CHEK AVIVA PLUS) w/Device KIT Use as directed 04/08/20   Clapacs, Madie Reno, MD  clonazePAM (KLONOPIN) 0.25 MG disintegrating tablet Take 1 tablet (0.25 mg total) by mouth 2 (two) times daily. 04/08/20   Clapacs, Madie Reno, MD  docusate sodium (COLACE) 100 MG capsule Take 1 capsule (100 mg total) by mouth 2 (two) times daily. 04/08/20   Clapacs, Madie Reno, MD  glucose blood (ACCU-CHEK AVIVA PLUS) test strip Use as instructed 04/08/20   Clapacs, Madie Reno, MD  haloperidol (HALDOL) 5 MG tablet Take 1 tablet (5 mg total) by mouth at bedtime. 04/08/20   Clapacs, Madie Reno, MD  insulin aspart (NOVOLOG) 100 UNIT/ML injection Inject 12 Units into the skin 3 (three) times daily with meals. 04/08/20   Clapacs, Madie Reno, MD  insulin glargine (LANTUS) 100 UNIT/ML injection Inject 0.48 mLs (48 Units total) into the skin daily. 04/08/20   Clapacs, Madie Reno, MD  lamoTRIgine (LAMICTAL) 100 MG tablet Take 2 tablets (200 mg total) by mouth daily. 04/08/20   Clapacs, Madie Reno, MD  Lancets (ACCU-CHEK SOFT TOUCH) lancets Use as instructed 04/08/20   Clapacs, Madie Reno, MD  melatonin 5 MG TABS Take 1 tablet (5 mg total) by mouth at bedtime. 04/08/20   Clapacs, Madie Reno, MD  nystatin cream (MYCOSTATIN) Apply topically 2 (two) times daily. To affected area 04/08/20   Gonzella Lex, MD    Physical Exam: Vitals:   07/20/20 1747 07/20/20 1748 07/20/20 2040  BP: (!) 145/104  122/69  Pulse: (!) 116  (!) 114  Resp: 18  20  Temp: 98.3 F (36.8 C)  98.8 F (37.1 C)  TempSrc: Oral  Oral  SpO2: 97%  95%  Weight:  135.6 kg   Height:  '5\' 7"'  (1.702 m)      Vitals:   07/20/20 1747 07/20/20 1748 07/20/20 2040  BP: (!)  145/104  122/69  Pulse: (!) 116  (!) 114  Resp: 18  20  Temp: 98.3 F (36.8 C)  98.8 F (37.1 C)  TempSrc: Oral  Oral  SpO2: 97%  95%  Weight:  135.6 kg   Height:  '5\' 7"'  (1.702 m)       Constitutional:  Somnolent but arousable. Not in any apparent distress HEENT:      Head: Normocephalic and atraumatic.         Eyes: PERLA, EOMI, Conjunctivae are normal. Sclera is non-icteric.       Mouth/Throat: Mucous membranes are moist.       Neck: Supple with no signs of meningismus. Cardiovascular: Regular rate and rhythm. No murmurs, gallops, or rubs. 2+ symmetrical distal pulses are present . No JVD. No LE edema Respiratory: Respiratory effort normal .Lungs sounds clear bilaterally. No wheezes, crackles, or rhonchi.  Gastrointestinal: Soft, non tender, and non distended with positive bowel sounds. No rebound or guarding. Genitourinary: No CVA tenderness. Musculoskeletal: Nontender with normal range of motion in all extremities. No cyanosis, or erythema of extremities. Neurologic:  Face is symmetric. Moving all extremities. No gross focal neurologic deficits . Skin: Skin is warm, dry.  No rash or ulcers Psychiatric: Mood and affect are normal    Labs on Admission: I have personally reviewed following labs and imaging studies  CBC: Recent Labs  Lab 07/20/20 1755  WBC 6.6  HGB 15.3*  HCT 46.3*  MCV 81.5  PLT 517   Basic Metabolic Panel: Recent Labs  Lab 07/20/20 1755 07/20/20 2350  NA 132* 141  K 4.4 4.3  CL 92* 100  CO2 24 24  GLUCOSE 798* 433*  BUN 20 16  CREATININE 1.29* 1.11*  CALCIUM 9.3 8.7*   GFR: Estimated Creatinine Clearance: 110.6 mL/min (A) (by C-G formula based on SCr of 1.11 mg/dL (H)). Liver Function Tests: No results for input(s): AST, ALT, ALKPHOS, BILITOT, PROT, ALBUMIN in the last 168 hours. No results for input(s): LIPASE, AMYLASE in the last 168 hours. No results for input(s): AMMONIA in the last 168 hours. Coagulation Profile: No results for  input(s): INR, PROTIME in the last 168 hours. Cardiac Enzymes: No results for input(s): CKTOTAL, CKMB, CKMBINDEX, TROPONINI in the last 168 hours. BNP (last 3 results) No results for input(s): PROBNP in the last 8760 hours. HbA1C: No results for input(s): HGBA1C in the last 72 hours. CBG: Recent Labs  Lab 07/20/20 1744 07/20/20 2010 07/20/20 2342  GLUCAP >600* 523* 421*   Lipid Profile: No results for input(s): CHOL, HDL, LDLCALC, TRIG, CHOLHDL, LDLDIRECT in the last 72 hours. Thyroid Function Tests: No results for input(s): TSH, T4TOTAL, FREET4, T3FREE, THYROIDAB in the last 72 hours. Anemia Panel: No results for input(s): VITAMINB12, FOLATE, FERRITIN, TIBC, IRON,  RETICCTPCT in the last 72 hours. Urine analysis:    Component Value Date/Time   COLORURINE STRAW (A) 07/20/2020 1755   APPEARANCEUR CLEAR (A) 07/20/2020 1755   LABSPEC 1.033 (H) 07/20/2020 1755   PHURINE 6.0 07/20/2020 1755   GLUCOSEU >=500 (A) 07/20/2020 1755   HGBUR NEGATIVE 07/20/2020 1755   BILIRUBINUR NEGATIVE 07/20/2020 1755   KETONESUR 20 (A) 07/20/2020 1755   PROTEINUR NEGATIVE 07/20/2020 1755   NITRITE NEGATIVE 07/20/2020 1755   LEUKOCYTESUR TRACE (A) 07/20/2020 1755    Radiological Exams on Admission: No results found.   Assessment/Plan 24 year old female with history of bipolar mood disorder, schizophrenia, morbid obesity and poorly controlled type 2 diabetes, presenting with elevated blood sugar readings voicing suicidal ideation while in the ER.      DKA (diabetic ketoacidoses) (HCC) -Continue insulin infusion per Endo tool protocol -Continue IV fluid hydration per protocol -Follow A1c    Suicidal ideation   Hx of bipolar disorder   Schizophrenia ([place]) -One-to-one suicide precautions -Behavioral health consult in the a.m.   Morbid obesity ([place]) -This complicates overall prognosis and care    DVT prophylaxis: Lovenox  Code Status: full code  Family Communication:  none  Disposition  Plan: Back to previous home environment Consults called: none  Status:.At the time of admission, it appears that the appropriate admission status for this patient is INPATIENT. This is judged to be reasonable and necessary in order to provide the required intensity of service to ensure the patient's safety given the presenting symptoms, physical exam findings, and initial radiographic and laboratory data in the context of their  Comorbid conditions.   Patient requires inpatient status due to high intensity of service, high risk for further deterioration and high frequency of surveillance required.   I certify that at the point of admission it is my clinical judgment that the patient will require inpatient hospital care spanning beyond [place] MD [facility]     07/21/2020, 2:02 AM

## 2020-07-21 NOTE — Progress Notes (Signed)
PHARMACIST - PHYSICIAN COMMUNICATION  CONCERNING:  Enoxaparin (Lovenox) for DVT Prophylaxis    RECOMMENDATION: Patient was prescribed enoxaprin 40mg  q24 hours for VTE prophylaxis.   Filed Weights   07/20/20 1748  Weight: 135.6 kg (299 lb)    Body mass index is 46.83 kg/m.  Estimated Creatinine Clearance: 110.6 mL/min (A) (by C-G formula based on SCr of 1.11 mg/dL (H)).   Based on [facility] policy patient is candidate for enoxaparin 40mg  every 12 hours due to BMI being >40.  DESCRIPTION: Pharmacy has adjusted enoxaparin dose per [facility] policy.  Patient is now receiving enoxaparin _40___mg every _12__ hours    , PharmD Clinical Pharmacist  07/21/2020 2:10 AM

## 2020-07-21 NOTE — Progress Notes (Signed)
Inpatient Diabetes Program Recommendations  AACE/ADA: New Consensus Statement on Inpatient Glycemic Control (2015)  Target Ranges:  Prepandial:   less than 140 mg/dL      Peak postprandial:   less than 180 mg/dL (1-2 hours)      Critically ill patients:  140 - 180 mg/dL   Lab Results  Component Value Date   GLUCAP 198 (H) 07/21/2020   HGBA1C 13.6 (H) 03/30/2020    Review of Glycemic Control  Diabetes history: DM Outpatient Diabetes medications: Lantus 48 units + Humalog 12 units tid meal coverage  Current orders for Inpatient glycemic control: Lantus 48 units + Novolog 12 units tid meal coverage if eats 50%  Inpatient Diabetes Program Recommendations:   Noted patient is currently in ED with multiple complaints of Hyperglycemia, malaise, suicidal ideation. -Please give Lantus insulin 2 hrs prior to D/C of IV insulin and cover CBG with Novolog correction @ time of IV insulin discontinued. Will follow during hospitalization.  Thank you, Billy Fischer. Analyah Mcconnon, RN, MSN, CDE  Diabetes Coordinator Inpatient Glycemic Control Team Team Pager [phone] (8am-5pm) 07/21/2020 10:41 AM

## 2020-07-21 NOTE — Progress Notes (Signed)
Pharmacy COVID-19 Monoclonal Antibody Screening  Cindy Leonard was identified as being not hospitalized with symptoms from Covid-19 on admission but an incidental positive PCR has been documented.  The patient may qualify for the use of monoclonal antibodies (mAB) for COVID-19 viral infection to prevent worsening symptoms stemming from Covid-19 infection.  The patient was identified based on a positive COVID-19 PCR and not requiring the use of supplemental oxygen at this time.  This patient meets the FDA criteria for Emergency Use Authorization of casirivimab/imdevimab.  Has a (+) direct SARS-CoV-2 viral test result  Is NOT hospitalized due to COVID-19  Is within 10 days of symptom onset  Has at least one of the high risk factor(s) for progression to severe COVID-19 and/or hospitalization as defined in EUA.  Specific high risk criteria : BMI > 25 and Diabetes  Additionally: The patient has not had a positive COVID-19 PCR in the last 90 days.  The patient is unvaccinated against COVID-19.  Since the patient is unvaccinated and meets high risk criteria, the patient is eligible for mAB administration.   This eligibility and indication for treatment was discussed with the patient's physician: Dr. Alford Highland  Plan: Based on the above discussion, it was decided that the patient will receive one dose of mAB combination.Casirivimab/imdevimab has been ordered. Pharmacy will coordinate administration timing with patient's nurse. Recommended infusion monitoring parameters communicated to the nursing team.   Raiford Noble, PharmD Pharmacy Resident  07/21/2020 11:06 AM

## 2020-07-21 NOTE — ED Notes (Signed)
Provider was notified of the elevated respiration rate. Provider stated to make sure the [facility] was elevated. HOB is elevated at 30 degrees. RN asked if I could lift the [facility] more and pt stated no. Pt declines difficulty breathing.

## 2020-07-21 NOTE — Progress Notes (Signed)
Patient ID: Cindy Leonard, female   DOB: 1996/05/29, 24 y.o.   MRN: [id number] [facility] PROGRESS NOTE  Amandeep Hogston Sacks GUR:[id number] DOB: [birth year] DOA: 07/20/2020 PCP: Patient, No Pcp Per  HPI/Subjective: Patient feeling okay.  Her sugars have been high.  Patient was involuntarily committed in the emergency room secondary to suicidal ideation.  Patient denies any suicidal or homicidal ideation to me today.  No cough or shortness of breath.  On room air.  Found to be Covid positive  Objective: Vitals:   07/21/20 1240 07/21/20 1431  BP: 125/74 135/88  Pulse: 98 (!) 115  Resp: 18 18  Temp:  99.2 F (37.3 C)  SpO2: 95% 94%    Intake/Output Summary (Last 24 hours) at 07/21/2020 1505 Last data filed at 07/21/2020 1300 Gross per 24 hour  Intake 3386.79 ml  Output 120 ml  Net 3266.79 ml   Filed Weights   07/20/20 1748  Weight: 135.6 kg    ROS: Review of Systems  Respiratory: Negative for cough and shortness of breath.   Cardiovascular: Negative for chest pain.  Gastrointestinal: Negative for abdominal pain, nausea and vomiting.   Exam: Physical Exam HENT:     Head: Normocephalic.     Mouth/Throat:     Pharynx: No oropharyngeal exudate.  Eyes:     General: Lids are normal.     Conjunctiva/sclera: Conjunctivae normal.  Cardiovascular:     Rate and Rhythm: Normal rate and regular rhythm.     Heart sounds: Normal heart sounds, S1 normal and S2 normal.  Pulmonary:     Breath sounds: Examination of the right-lower field reveals decreased breath sounds. Examination of the left-lower field reveals decreased breath sounds. Decreased breath sounds present. No wheezing, rhonchi or rales.  Abdominal:     Palpations: Abdomen is soft.     Tenderness: There is no abdominal tenderness.  Musculoskeletal:     Right foot: No swelling.     Left foot: No swelling.  Skin:    General: Skin is warm.     Findings: No rash.  Neurological:     Mental Status: She is alert and  oriented to person, place, and time.       Data Reviewed: Basic Metabolic Panel: Recent Labs  Lab 07/20/20 1755 07/20/20 2350 07/21/20 0455  NA 132* 141 139  K 4.4 4.3 4.0  CL 92* 100 109  CO2 24 24 20*  GLUCOSE 798* 433* 287*  BUN 20 16 11   CREATININE 1.29* 1.11* 0.82  CALCIUM 9.3 8.7* 7.2*   CBC: Recent Labs  Lab 07/20/20 1755 07/21/20 0216  WBC 6.6 6.2  HGB 15.3* 13.9  HCT 46.3* 42.7  MCV 81.5 81.0  PLT 198 179    CBG: Recent Labs  Lab 07/21/20 0455 07/21/20 0625 07/21/20 0809 07/21/20 0938 07/21/20 1134  GLUCAP 326* 248* 252* 198* 309*    Recent Results (from the past 240 hour(s))  SARS Coronavirus 2 by RT PCR (hospital order, performed in [facility] hospital lab) Nasopharyngeal Nasopharyngeal Swab     Status: Abnormal   Collection Time: 07/21/20 12:45 AM   Specimen: Nasopharyngeal Swab  Result Value Ref Range Status   SARS Coronavirus 2 POSITIVE (A) NEGATIVE Final    Comment: RESULT CALLED TO, READ BACK BY AND VERIFIED WITH: ASHTON PETERS 07/21/20 AT 0202 HS (NOTE) SARS-CoV-2 target nucleic acids are DETECTED  SARS-CoV-2 RNA is generally detectable in upper respiratory specimens  during the acute phase of infection.  Positive results are indicative  of the presence of the identified virus, but do not rule out bacterial infection or co-infection with other pathogens not detected by the test.  Clinical correlation with patient history and  other diagnostic information is necessary to determine patient infection status.  The expected result is negative.  Fact Sheet for Patients:   BoilerBrush.com.cy   Fact Sheet for Healthcare Providers:   https://pope.com/    This test is not yet approved or cleared by the [place] FDA and  has been authorized for detection and/or diagnosis of SARS-CoV-2 by FDA under an Emergency Use Authorization (EUA).  This EUA will remain in effect (meaning this test   can be used) for the duration of  the COVID-19 declaration under Section 564(b)(1) of the Act, 21 U.S.C. section 360-bbb-3(b)(1), unless the authorization is terminated or revoked sooner.  Performed at [facility], [address] [place], [place] [address]       Scheduled Meds: . clonazePAM  0.25 mg Oral BID  . docusate sodium  100 mg Oral BID  . enoxaparin (LOVENOX) injection  40 mg Subcutaneous Q12H  . haloperidol  5 mg Oral QHS  . insulin aspart  0-20 Units Subcutaneous TID WC  . insulin aspart  12 Units Subcutaneous TID WC  . [START ON 07/22/2020] insulin glargine  48 Units Subcutaneous Daily  . lamoTRIgine  200 mg Oral Daily  . melatonin  5 mg Oral QHS   Continuous Infusions: . sodium chloride    . famotidine (PEPCID) IV      Assessment/Plan:  1. Diabetic ketoacidosis.  Patient able to come off the insulin drip this afternoon and is on short acting insulin prior to meals and long-acting insulin daily. 2. Covid positive.  Chest x-ray negative and breathing comfortably on room air.  In speaking with pharmacist patient and the patient's mother we will give the monoclonal antibody today 3. Involuntary committed with suicidal ideation with history of bipolar disorder and schizophrenia.  Psychiatry to evaluate.  Continue psychiatric medications as per psychiatrist. 4. Obesity with a BMI of 46.83 5. Urinalysis positive and given a dose of Rocephin in the emergency room.  Await urine culture before further antibiotic.    Code Status:     Code Status Orders  (From admission, onward)         Start     Ordered   07/21/20 0201  Full code  Continuous        07/21/20 0201        Code Status History    Date Active Date Inactive Code Status Order ID Comments User Context   07/21/2020 0007 07/21/2020 0201 Full Code [id number]  Shaune Pollack, MD ED   04/20/2020 0220 04/20/2020 1628 Full Code [id number]  Nita Sickle, MD ED   04/04/2020 1059 04/08/2020 1806 Full Code  [id number]  Audery Amel, MD Inpatient   03/28/2020 1526 04/04/2020 1050 Full Code [id number]  Lucile Shutters, MD ED   Advance Care Planning Activity     Family Communication: Spoke with the patient's mother on the phone Disposition Plan: Status is: Inpatient  Dispo: The patient is from: Home              Anticipated d/c is to: Home              Anticipated d/c date is: Currently involuntary committed              Patient currently being treated for diabetic ketoacidosis and Covid positive with monoclonal antibody.  Consultants:  Psychiatry  Time spent: 33 minutes  [employer]

## 2020-07-21 NOTE — ED Provider Notes (Signed)
I assumed care of the patient from Dr. Erma Heritage at 5:00 AM with recommendation to follow-up on BMP.  Patient's BMP shows an improvement in her glucose to 433 however anion gap increased to 17.  As such concern for diabetic ketoacidosis, insulin infusion initiated.  Patient discussed with hospitalist after hospital admission further evaluation and management.  Hospital staff was notified of the patient's suicidal ideation and the need for psychiatry consultation   Darci Current, MD 07/21/20 [phone]

## 2020-07-21 NOTE — ED Notes (Signed)
Provider messaged to notify that pts respiration rate is elevated between 25 and 30bpm on average.

## 2020-07-21 NOTE — ED Notes (Signed)
Dr. Para March notified pt tested positive for covid-19. Pt stated potassium was burning. Dr. Para March notified and the rate has been changed to 64ml/hr. Pt is on cardiac, bp and pulse ox monitoring.

## 2020-07-21 NOTE — ED Notes (Signed)
RN verified with Dr. Para March that provider wants a total of 2, IVPB of potassium. Provider stated yes to make a total of of IV potassium.

## 2020-07-22 ENCOUNTER — Inpatient Hospital Stay: Payer: Medicaid Other

## 2020-07-22 DIAGNOSIS — J1282 Pneumonia due to coronavirus disease 2019: Secondary | ICD-10-CM

## 2020-07-22 DIAGNOSIS — N390 Urinary tract infection, site not specified: Secondary | ICD-10-CM

## 2020-07-22 LAB — BASIC METABOLIC PANEL
Anion gap: 13 (ref 5–15)
BUN: 12 mg/dL (ref 6–20)
CO2: 21 mmol/L — ABNORMAL LOW (ref 22–32)
Calcium: 8.8 mg/dL — ABNORMAL LOW (ref 8.9–10.3)
Chloride: 98 mmol/L (ref 98–111)
Creatinine, Ser: 0.96 mg/dL (ref 0.44–1.00)
GFR calc Af Amer: >60 mL/min (ref >60)
GFR calc non Af Amer: >60 mL/min (ref >60)
Glucose, Bld: 429 mg/dL — ABNORMAL HIGH (ref 70–99)
Potassium: 3.8 mmol/L (ref 3.5–5.1)
Sodium: 132 mmol/L — ABNORMAL LOW (ref 135–145)

## 2020-07-22 LAB — URINE CULTURE: Culture: >=100000 — AB

## 2020-07-22 LAB — GLUCOSE, CAPILLARY
Glucose-Capillary: 349 mg/dL — ABNORMAL HIGH (ref 70–99)
Glucose-Capillary: 311 mg/dL — ABNORMAL HIGH (ref 70–99)
Glucose-Capillary: 219 mg/dL — ABNORMAL HIGH (ref 70–99)
Glucose-Capillary: 345 mg/dL — ABNORMAL HIGH (ref 70–99)

## 2020-07-22 MED ORDER — ZINC SULFATE 220 (50 ZN) MG PO CAPS
220.0000 mg | ORAL_CAPSULE | Freq: Every day | ORAL | Status: DC
  Administered 2020-07-22 – 2020-07-25 (×4): 220 mg via ORAL
  Filled 2020-07-22 (×4): qty 1

## 2020-07-22 MED ORDER — ALBUTEROL SULFATE HFA 108 (90 BASE) MCG/ACT IN AERS
2.0000 | INHALATION_SPRAY | Freq: Four times a day (QID) | RESPIRATORY_TRACT | Status: DC
  Administered 2020-07-22 – 2020-07-25 (×11): 2 via RESPIRATORY_TRACT
  Filled 2020-07-22: qty 6.7

## 2020-07-22 MED ORDER — SODIUM CHLORIDE 0.9 % IV SOLN
200.0000 mg | Freq: Once | INTRAVENOUS | Status: AC
  Administered 2020-07-22: 200 mg via INTRAVENOUS
  Filled 2020-07-22: qty 200

## 2020-07-22 MED ORDER — CLONAZEPAM 0.25 MG PO TBDP
0.5000 mg | ORAL_TABLET | Freq: Two times a day (BID) | ORAL | Status: DC
  Administered 2020-07-22 – 2020-07-25 (×6): 0.5 mg via ORAL
  Filled 2020-07-22 (×6): qty 2

## 2020-07-22 MED ORDER — SODIUM CHLORIDE 0.9 % IV SOLN
1.0000 g | INTRAVENOUS | Status: AC
  Administered 2020-07-22 – 2020-07-23 (×2): 1 g via INTRAVENOUS
  Filled 2020-07-22 (×2): qty 1

## 2020-07-22 MED ORDER — ASCORBIC ACID 500 MG PO TABS
250.0000 mg | ORAL_TABLET | Freq: Every day | ORAL | Status: DC
  Administered 2020-07-22 – 2020-07-25 (×4): 250 mg via ORAL
  Filled 2020-07-22 (×4): qty 1

## 2020-07-22 MED ORDER — TRIHEXYPHENIDYL HCL 2 MG PO TABS
1.0000 mg | ORAL_TABLET | Freq: Two times a day (BID) | ORAL | Status: DC
  Administered 2020-07-23 – 2020-07-25 (×5): 1 mg via ORAL
  Filled 2020-07-22 (×7): qty 1

## 2020-07-22 MED ORDER — GUAIFENESIN 100 MG/5ML PO SOLN
5.0000 mL | ORAL | Status: DC | PRN
  Administered 2020-07-22: 12:00:00 100 mg via ORAL
  Filled 2020-07-22: qty 10

## 2020-07-22 MED ORDER — INSULIN GLARGINE 100 UNIT/ML ~~LOC~~ SOLN
52.0000 [IU] | Freq: Every day | SUBCUTANEOUS | Status: DC
  Administered 2020-07-22 – 2020-07-23 (×2): 52 [IU] via SUBCUTANEOUS
  Filled 2020-07-22 (×2): qty 0.52

## 2020-07-22 MED ORDER — DULOXETINE HCL 20 MG PO CPEP
20.0000 mg | ORAL_CAPSULE | Freq: Every day | ORAL | Status: DC
  Administered 2020-07-22 – 2020-07-25 (×4): 20 mg via ORAL
  Filled 2020-07-22 (×4): qty 1

## 2020-07-22 MED ORDER — HYDROCOD POLST-CPM POLST ER 10-8 MG/5ML PO SUER: 5.0000 mL | Freq: Two times a day (BID) | ORAL | Status: DC | PRN

## 2020-07-22 MED ORDER — SODIUM CHLORIDE 0.9 % IV SOLN
100.0000 mg | Freq: Every day | INTRAVENOUS | Status: DC
  Administered 2020-07-23 – 2020-07-25 (×3): 100 mg via INTRAVENOUS
  Filled 2020-07-22 (×3): qty 20

## 2020-07-22 MED ORDER — HALOPERIDOL 5 MG PO TABS
7.5000 mg | ORAL_TABLET | Freq: Every day | ORAL | Status: DC
  Administered 2020-07-22 – 2020-07-24 (×3): 7.5 mg via ORAL
  Filled 2020-07-22 (×4): qty 1

## 2020-07-22 NOTE — Progress Notes (Signed)
Inpatient Diabetes Program Recommendations  AACE/ADA: New Consensus Statement on Inpatient Glycemic Control   Target Ranges:  Prepandial:   less than 140 mg/dL      Peak postprandial:   less than 180 mg/dL (1-2 hours)      Critically ill patients:  140 - 180 mg/dL  Results for DERISHA, FUNDERBURKE (MRN [id number]) as of 07/22/2020 08:17  Ref. Range 07/21/2020 08:09 07/21/2020 09:38 07/21/2020 11:34 07/21/2020 17:01 07/21/2020 21:06 07/22/2020 07:59  Glucose-Capillary Latest Ref Range: 70 - 99 mg/dL 435 (H) 686 (H) 168 (H) 219 (H) 304 (H) 349 (H)    Review of Glycemic Control  Diabetes history: DM2 Outpatient Diabetes medications: Lantus 48 units QHS, Humalog 12 units TID with meals Current orders for Inpatient glycemic control: Lantus 48 units QHS, Novolog 12 units TID with meals, Novolog 0-20 units TID with meals  Inpatient Diabetes Program Recommendations:    Insulin: Please consider increasing Lantus to 52 units QHS and add Novolog 0-5 units QHS for bedtime correction.  Thanks, Orlando Penner, RN, MSN, CDE Diabetes Coordinator Inpatient Diabetes Program [phone] (Team Pager from 8am to 5pm)

## 2020-07-22 NOTE — Consult Note (Signed)
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 female patient admitted with  COVID 19 with respiratory complications---DM that needs to be in control ---she voiced SI and possible plans in frustration with management of DM   She was placed on IVC initially .  HPI:  She says she is compliant with meds however mom does report she goes out at night and is not generally responsible per se.   However she contracts for safety and says current meds are okay with her.   She does not have antidepressant that I did add today  She has basic mood swings, ups and downs, highs and lows, lability, lack of sleep speeded actions and thoughts, ---impulsive acts ---mixed with major depressive and anxiety symptoms   Mood issues worsened by health concerns including DM obesity and need for tighter control and the issues of lifestyle pressures to maintain  She voices SI due to overwhelm and anxiety but has no active SI HI or plans  Asked three diff ways and times she says she does not need sitter and can be on her own in recovery from COVID ---and to manage the rest of her DM stabilization   She says she has no active Side effects from her Psych meds and is already followed by outpatient psychiatry regularly she says    Past  Psychiatric History:  Previously admitted to our unit --where she spent several days with these issues but feels she is in a better space now and does not need admission   Risk to Self: Suicidal Ideation: Yes-Currently Present Suicidal Intent: Yes-Currently Present Is patient at risk for suicide?: Yes Suicidal Plan?: No-Not Currently/Within Last 6 Months Access to Means: Yes Specify Access to Suicidal Means: Patient had access to a road What has been your use of drugs/alcohol within the last 12 months?: None How many times?: 1 Other Self Harm Risks: None Triggers for Past Attempts: None known Intentional Self Injurious Behavior: None Risk to Others: Homicidal Ideation: No Thoughts of Harm to Others: No Current Homicidal Intent: No Current Homicidal Plan: No Access to Homicidal Means: No Identified Victim: None History of harm to others?: No Assessment of Violence: None Noted Violent Behavior Description: None Does patient have access to weapons?: No Criminal Charges Pending?: No Does patient have a court date: No Prior Inpatient Therapy: Prior Inpatient Therapy: Yes Prior Therapy Dates: Multiple Dates Prior Therapy Facilty/Provider(s): [facility], [facility] Reason for Treatment: Bipolar Prior Outpatient Therapy: Prior Outpatient Therapy: No Does patient have an ACCT team?: No Does patient have Intensive In-House Services?  : No Does patient have [facility] services? : No Does patient have P4CC services?: No  Past Medical History:  Past Medical History:  Diagnosis Date  . Bipolar 1 disorder (HCC)   . Depression   .  Schizophrenia (HCC)    History reviewed. No pertinent surgical history. Family History:  Family History  Problem Relation Age of Onset  . Stroke Mother   . Diabetes Other   . Hypertension Other   . Asthma Father   . Asthma Brother    Family Psychiatric  History:  Mom with elements of anxiety and mood  Social History:  Social History   Substance and  Sexual Activity  Alcohol Use No     Social History   Substance and Sexual Activity  Drug Use No   Comment: clean of cocaine x 3 yrs    Social History   Socioeconomic History  . Marital status: Single    Spouse name: Not on file  . Number of children: Not on file  . Years of education: Not on file  . Highest education level: Not on file  Occupational History  . Not on file  Tobacco Use  . Smoking status: Never Smoker  . Smokeless tobacco: Never Used  Vaping Use  . Vaping Use: Never used  Substance and Sexual Activity  . Alcohol use: No  . Drug use: No    Comment: clean of cocaine x 3 yrs  . Sexual activity: Yes    Birth control/protection: None  Other Topics Concern  . Not on file  Social History Narrative  . Not on file   Social Determinants of Health   Financial Resource Strain:   . Difficulty of Paying Living Expenses: Not on file  Food Insecurity:   . Worried About Programme researcher, broadcasting/film/video in the Last Year: Not on file  . Ran Out of Food in the Last Year: Not on file  Transportation Needs:   . Lack of Transportation (Medical): Not on file  . Lack of Transportation (Non-Medical): Not on file  Physical Activity:   . Days of Exercise per Week: Not on file  . Minutes of Exercise per Session: Not on file  Stress:   . Feeling of Stress : Not on file  Social Connections:   . Frequency of Communication with Friends and Family: Not on file  . Frequency of Social Gatherings with Friends and Family: Not on file  . Attends Religious Services: Not on file  . Active Member of Clubs or Organizations: Not on file  . Attends Banker Meetings: Not on file  . Marital Status: Not on file   Additional Social History:   she needs supportive groups for mgt of dual diagnosis issues and lifestyle for DM  Allergies:   Allergies  Allergen Reactions  . Food Swelling    Eyes/throat swelling (blueberries)    Labs:  Results for orders placed or performed during the  hospital encounter of 07/20/20 (from the past 48 hour(s))  Glucose, capillary     Status: Abnormal   Collection Time: 07/20/20  8:10 PM  Result Value Ref Range   Glucose-Capillary 523 (HH) 70 - 99 mg/dL    Comment: Glucose reference range applies only to samples taken after fasting for at least 8 hours.   Comment 1 Notify RN   Ethanol     Status: None   Collection Time: 07/20/20  8:31 PM  Result Value Ref Range   Alcohol, Ethyl (B) <10 <10 mg/dL    Comment: (NOTE) Lowest detectable limit for serum alcohol is 10 mg/dL.  For medical purposes only. Performed at [facility], [address], [place], [place] [address]   Salicylate level     Status:  Abnormal   Collection Time: 07/20/20  8:31 PM  Result Value Ref Range   Salicylate Lvl <7.0 (L) 7.0 - 30.0 mg/dL    Comment: Performed at [facility], [address] [place], [place] [address]  Acetaminophen level     Status: Abnormal   Collection Time: 07/20/20  8:31 PM  Result Value Ref Range   Acetaminophen (Tylenol), Serum <10 (L) 10 - 30 ug/mL    Comment: (NOTE) Therapeutic concentrations vary significantly. A range of 10-30 ug/mL  may be an effective concentration for many patients. However, some  are best treated at concentrations outside of this range. Acetaminophen concentrations >150 ug/mL at 4 hours after ingestion  and >50 ug/mL at 12 hours after ingestion are often associated with  toxic reactions.  Performed at [facility], [address] [place], [place] [address]   Glucose, capillary     Status: Abnormal   Collection Time: 07/20/20 11:42 PM  Result Value Ref Range   Glucose-Capillary 421 (H) 70 - 99 mg/dL    Comment: Glucose reference range applies only to samples taken after fasting for at least 8 hours.  Basic metabolic panel     Status: Abnormal   Collection Time: 07/20/20 11:50 PM  Result Value Ref Range   Sodium 141 135 - 145 mmol/L   Potassium 4.3 3.5 - 5.1 mmol/L    Chloride 100 98 - 111 mmol/L   CO2 24 22 - 32 mmol/L   Glucose, Bld 433 (H) 70 - 99 mg/dL    Comment: Glucose reference range applies only to samples taken after fasting for at least 8 hours.   BUN 16 6 - 20 mg/dL   Creatinine, Ser 6.81 (H) 0.44 - 1.00 mg/dL   Calcium 8.7 (L) 8.9 - 10.3 mg/dL   GFR calc non Af Amer >60 >60 mL/min   GFR calc Af Amer >60 >60 mL/min   Anion gap 17 (H) 5 - 15    Comment: Performed at [facility], [address] [place], [place] [address]  SARS Coronavirus 2 by RT PCR (hospital order, performed in [facility] hospital lab) Nasopharyngeal Nasopharyngeal Swab     Status: Abnormal   Collection Time: 07/21/20 12:45 AM   Specimen: Nasopharyngeal Swab  Result Value Ref Range   SARS Coronavirus 2 POSITIVE (A) NEGATIVE    Comment: RESULT CALLED TO, READ BACK BY AND VERIFIED WITH: ASHTON PETERS 07/21/20 AT 0202 HS (NOTE) SARS-CoV-2 target nucleic acids are DETECTED  SARS-CoV-2 RNA is generally detectable in upper respiratory specimens  during the acute phase of infection.  Positive results are indicative  of the presence of the identified virus, but do not rule out bacterial infection or co-infection with other pathogens not detected by the test.  Clinical correlation with patient history and  other diagnostic information is necessary to determine patient infection status.  The expected result is negative.  Fact Sheet for Patients:   BoilerBrush.com.cy   Fact Sheet for Healthcare Providers:   https://pope.com/    This test is not yet approved or cleared by the [place] FDA and  has been authorized for detection and/or diagnosis of SARS-CoV-2 by FDA under an Emergency Use Authorization (EUA).  This EUA will remain in effect (meaning this test  can be used) for the duration of  the COVID-19 declaration under Section 564(b)(1) of the Act, 21 U.S.C. section 360-bbb-3(b)(1), unless the authorization  is terminated or revoked sooner.  Performed at [facility], [address] [place], [place] [address]   Beta-hydroxybutyric acid     Status: Abnormal   Collection Time: 07/21/20  2:16 AM  Result Value Ref Range   Beta-Hydroxybutyric Acid 2.42 (H) 0.05 - 0.27 mmol/L    Comment: Performed at [facility], [address] [place], [place] [address]  CBC     Status: Abnormal   Collection Time: 07/21/20  2:16 AM  Result Value Ref Range   WBC 6.2 4.0 - 10.5 K/uL   RBC 5.27 (H) 3.87 - 5.11 MIL/uL   Hemoglobin 13.9 12.0 - 15.0 g/dL   HCT 09.8 36 - 46 %   MCV 81.0 80.0 - 100.0 fL   MCH 26.4 26.0 - 34.0 pg   MCHC 32.6 30.0 - 36.0 g/dL   RDW 11.9 14.7 - 82.9 %   Platelets 179 150 - 400 K/uL   nRBC 0.0 0.0 - 0.2 %    Comment: Performed at [facility], [address] [place], [place] [address]  Hemoglobin A1c     Status: Abnormal   Collection Time: 07/21/20  2:16 AM  Result Value Ref Range   Hgb A1c MFr Bld 14.7 (H) 4.8 - 5.6 %    Comment: (NOTE) Pre diabetes:          5.7%-6.4%  Diabetes:              >6.4%  Glycemic control for   <7.0% adults with diabetes    Mean Plasma Glucose 375.19 mg/dL    Comment: Performed at [facility], [address] [place], [place] [address]  Glucose, capillary     Status: Abnormal   Collection Time: 07/21/20  2:24 AM  Result Value Ref Range   Glucose-Capillary 422 (H) 70 - 99 mg/dL    Comment: Glucose reference range applies only to samples taken after fasting for at least 8 hours.  Glucose, capillary     Status: Abnormal   Collection Time: 07/21/20  3:10 AM  Result Value Ref Range   Glucose-Capillary 440 (H) 70 - 99 mg/dL    Comment: Glucose reference range applies only to samples taken after fasting for at least 8 hours.  Glucose, capillary     Status: Abnormal   Collection Time: 07/21/20  3:46 AM  Result Value Ref Range   Glucose-Capillary 323 (H) 70 - 99 mg/dL    Comment: Glucose reference range  applies only to samples taken after fasting for at least 8 hours.  Basic metabolic panel     Status: Abnormal   Collection Time: 07/21/20  4:55 AM  Result Value Ref Range   Sodium 139 135 - 145 mmol/L   Potassium 4.0 3.5 - 5.1 mmol/L   Chloride 109 98 - 111 mmol/L   CO2 20 (L) 22 - 32 mmol/L   Glucose, Bld 287 (H) 70 - 99 mg/dL    Comment: Glucose reference range applies only to samples taken after fasting for at least 8 hours.   BUN 11 6 - 20 mg/dL   Creatinine, Ser 8.46 0.44 - 1.00 mg/dL   Calcium 7.2 (L) 8.9 - 10.3 mg/dL   GFR calc non Af Amer >60 >60 mL/min   GFR calc Af Amer >60 >60 mL/min   Anion gap 10 5 - 15    Comment: Performed at [facility], [address] [place], [place] [address]  Glucose, capillary     Status: Abnormal   Collection Time: 07/21/20  4:55 AM  Result Value Ref Range  Glucose-Capillary 326 (H) 70 - 99 mg/dL    Comment: Glucose reference range applies only to samples taken after fasting for at least 8 hours.  Beta-hydroxybutyric acid     Status: Abnormal   Collection Time: 07/21/20  5:00 AM  Result Value Ref Range   Beta-Hydroxybutyric Acid 1.20 (H) 0.05 - 0.27 mmol/L    Comment: Performed at [facility], [address] [place], [place] [address]  Glucose, capillary     Status: Abnormal   Collection Time: 07/21/20  6:25 AM  Result Value Ref Range   Glucose-Capillary 248 (H) 70 - 99 mg/dL    Comment: Glucose reference range applies only to samples taken after fasting for at least 8 hours.  Glucose, capillary     Status: Abnormal   Collection Time: 07/21/20  8:09 AM  Result Value Ref Range   Glucose-Capillary 252 (H) 70 - 99 mg/dL    Comment: Glucose reference range applies only to samples taken after fasting for at least 8 hours.  Glucose, capillary     Status: Abnormal   Collection Time: 07/21/20  9:38 AM  Result Value Ref Range   Glucose-Capillary 198 (H) 70 - 99 mg/dL    Comment: Glucose reference range applies only to  samples taken after fasting for at least 8 hours.  Glucose, capillary     Status: Abnormal   Collection Time: 07/21/20 11:34 AM  Result Value Ref Range   Glucose-Capillary 309 (H) 70 - 99 mg/dL    Comment: Glucose reference range applies only to samples taken after fasting for at least 8 hours.  Glucose, capillary     Status: Abnormal   Collection Time: 07/21/20  5:01 PM  Result Value Ref Range   Glucose-Capillary 219 (H) 70 - 99 mg/dL    Comment: Glucose reference range applies only to samples taken after fasting for at least 8 hours.  Glucose, capillary     Status: Abnormal   Collection Time: 07/21/20  9:06 PM  Result Value Ref Range   Glucose-Capillary 304 (H) 70 - 99 mg/dL    Comment: Glucose reference range applies only to samples taken after fasting for at least 8 hours.   Comment 1 Notify RN    Comment 2 Document in Chart   Glucose, capillary     Status: Abnormal   Collection Time: 07/22/20  7:59 AM  Result Value Ref Range   Glucose-Capillary 349 (H) 70 - 99 mg/dL    Comment: Glucose reference range applies only to samples taken after fasting for at least 8 hours.  Basic metabolic panel     Status: Abnormal   Collection Time: 07/22/20 10:04 AM  Result Value Ref Range   Sodium 132 (L) 135 - 145 mmol/L   Potassium 3.8 3.5 - 5.1 mmol/L   Chloride 98 98 - 111 mmol/L   CO2 21 (L) 22 - 32 mmol/L   Glucose, Bld 429 (H) 70 - 99 mg/dL    Comment: Glucose reference range applies only to samples taken after fasting for at least 8 hours.   BUN 12 6 - 20 mg/dL   Creatinine, Ser 7.820.96 0.44 - 1.00 mg/dL   Calcium 8.8 (L) 8.9 - 10.3 mg/dL   GFR calc non Af Amer >60 >60 mL/min   GFR calc Af Amer >60 >60 mL/min   Anion gap 13 5 - 15    Comment: Performed at [facility], [address] [place], [place] [address]  Glucose, capillary  Status: Abnormal   Collection Time: 07/22/20 11:53 AM  Result Value Ref Range   Glucose-Capillary 311 (H) 70 - 99 mg/dL    Comment: Glucose  reference range applies only to samples taken after fasting for at least 8 hours.  Glucose, capillary     Status: Abnormal   Collection Time: 07/22/20  4:21 PM  Result Value Ref Range   Glucose-Capillary 219 (H) 70 - 99 mg/dL    Comment: Glucose reference range applies only to samples taken after fasting for at least 8 hours.    Current Facility-Administered Medications  Medication Dose Route Frequency Provider Last Rate Last Admin  . 0.9 %  sodium chloride infusion   Intravenous PRN Alford Highland, MD      . acetaminophen (TYLENOL) tablet 650 mg  650 mg Oral Q4H PRN Shaune Pollack, MD      . albuterol (VENTOLIN HFA) 108 (90 Base) MCG/ACT inhaler 2 puff  2 puff Inhalation Once PRN Wieting, Richard, MD      . albuterol (VENTOLIN HFA) 108 (90 Base) MCG/ACT inhaler 2 puff  2 puff Inhalation Q6H Alford Highland, MD   2 puff at 07/22/20 1217  . alum & mag hydroxide-simeth (MAALOX/MYLANTA) 200-200-20 MG/5ML suspension 30 mL  30 mL Oral Q6H PRN Shaune Pollack, MD      . ascorbic acid (VITAMIN C) tablet 250 mg  250 mg Oral Daily Alford Highland, MD   250 mg at 07/22/20 1217  . cefTRIAXone (ROCEPHIN) 1 g in sodium chloride 0.9 % 100 mL IVPB  1 g Intravenous Q24H Alford Highland, MD   Stopped at 07/22/20 1346  . chlorpheniramine-HYDROcodone (TUSSIONEX) 10-8 MG/5ML suspension 5 mL  5 mL Oral Q12H PRN Alford Highland, MD      . clonazePAM Scarlette Calico) disintegrating tablet 0.5 mg  0.5 mg Oral BID Roselind Messier, MD      . diphenhydrAMINE (BENADRYL) injection 50 mg  50 mg Intravenous Once PRN Alford Highland, MD      . docusate sodium (COLACE) capsule 100 mg  100 mg Oral BID Shaune Pollack, MD   100 mg at 07/22/20 9476  . DULoxetine (CYMBALTA) DR capsule 20 mg  20 mg Oral Daily Roselind Messier, MD      . enoxaparin (LOVENOX) injection 40 mg  40 mg Subcutaneous Q12H Andris Baumann, MD   40 mg at 07/22/20 5465  . EPINEPHrine (EPI-PEN) injection 0.3 mg  0.3 mg Intramuscular Once PRN Alford Highland, MD      . famotidine (PEPCID) IVPB 20 mg premix  20 mg Intravenous Once PRN Alford Highland, MD      . guaiFENesin (ROBITUSSIN) 100 MG/5ML solution 100 mg  5 mL Oral Q4H PRN Alford Highland, MD   100 mg at 07/22/20 1216  . haloperidol (HALDOL) tablet 7.5 mg  7.5 mg Oral QHS Roselind Messier, MD      . insulin aspart (novoLOG) injection 0-20 Units  0-20 Units Subcutaneous TID WC Shaune Pollack, MD   7 Units at 07/22/20 1659  . insulin aspart (novoLOG) injection 12 Units  12 Units Subcutaneous TID WC Shaune Pollack, MD   12 Units at 07/22/20 1700  . insulin glargine (LANTUS) injection 52 Units  52 Units Subcutaneous Daily Alford Highland, MD   52 Units at 07/22/20 1219  . lamoTRIgine (LAMICTAL) tablet 200 mg  200 mg Oral Daily Shaune Pollack, MD   200 mg at 07/22/20 0834  . melatonin tablet 5 mg  5 mg Oral QHS Shaune Pollack, MD  5 mg at 07/21/20 2159  . ondansetron (ZOFRAN) tablet 4 mg  4 mg Oral Q8H PRN Shaune Pollack, MD      . Melene Muller ON 07/23/2020] remdesivir 100 mg in sodium chloride 0.9 % 100 mL IVPB  100 mg Intravenous Daily Albina Billet, RPH      . [START ON 07/23/2020] trihexyphenidyl (ARTANE) tablet 1 mg  1 mg Oral BID WC Roselind Messier, MD      . zinc sulfate capsule 220 mg  220 mg Oral Daily Alford Highland, MD   220 mg at 07/22/20 1217    Musculoskeletal: Strength & Muscle Tone:  Not known  Gait & Station:  Manages with assistance at times  Patient leans:  Na   Psychiatric Specialty Exam: Physical Exam  Review of Systems  Blood pressure 128/85, pulse 100, temperature 99.4 F (37.4 C), temperature source Oral, resp. rate 16, height 5\' 7"  (1.702 m), weight 135.6 kg, last menstrual period 03/02/2020, SpO2 94 %, unknown if currently breastfeeding.Body mass index is 46.83 kg/m.  Mental Status  Obese AA female in COVID room, mask is off careless  Eye contact okay  Rapport strange and odd at baseline Concentration and attention normal  Consciousness not  clouded or fluctuant Speech somewhat loud and pressured at baseline Mood and affect ---normal but slightly strange  Thought process and content no new psychosis or mania Movements --no shakes tremors and tics Abstraction fair Memory normal for immediate remote recent Fund of knowledge intelligence below average Judgement insight reliability fair to poor SI and HI contracts for safety asked three diff ways and times  Says she does not need sitter or IVC  SI she said were just passing emotions                                                      Psycho motor activity okay Recall normal Language regular  Akathisia none Handedness not known  Aims not done ADL---limited and needs support and coaching Cognition fair Sleep on and off  Assets ---caring family            Treatment Plan Summary:  AA female with above problems --contracts for safety and is okay with medications  I adjusted and increased haldol to 7.5 qhs Added artane 1 bid for side effect prevention Added Cymbalta for mood and Anxiety  She continues with  lamictal 200 daily   No other change for now   She says she has no active SI or plans     Disposition: Per CW and her and family and post med stabilization   Roselind Messier, MD 07/22/2020 7:04 PM

## 2020-07-22 NOTE — TOC Initial Note (Signed)
Transition of Care [facility]) - Initial/Assessment Note    Patient Details  Name: Cindy Leonard MRN: [id number] Date of Birth: 11/14/1995  Transition [facility]) CM/SW Contact:    Allayne Butcher, RN Phone Number: 07/22/2020, 4:11 PM  Clinical Narrative:                 Patient admitted to the hospital with DKA and found to have COVID.  Patient seems to be having a hard time managing her diabetes.  Patient has a history of bipolar and suicidal threats.  Patient is currently IVC'd.  RNCM was unable to speak with patient over the phone but was able to reach her mother who is listed as an emergency contact.  From speaking with the mother Cindy Leonard, patient has been living with her since August 23rd and before that she was at the [facility], where her mother said she was doing well.  Patient's mother does not want the patient to return to her home at discharge, she reports that the patient sleeps most of the time and then goes out and stays out all night.  Patient does not have an established primary care provider but she does have Medicaid and should have one assigned to her.   TOC will cont to follow and assist as able with discharge planning.    Expected Discharge Plan: Home/Self Care Barriers to Discharge: Continued Medical Work up   Patient Goals and CMS Choice        Expected Discharge Plan and Services Expected Discharge Plan: Home/Self Care   Discharge Planning Services: CM Consult   Living arrangements for the past 2 months: Single Family Home                           HH Arranged: NA          Prior Living Arrangements/Services Living arrangements for the past 2 months: Single Family Home Lives with:: Parents Patient language and need for interpreter reviewed:: Yes              Criminal Activity/Legal Involvement Pertinent to Current Situation/Hospitalization: No - Comment as needed  Activities of Daily Living Home Assistive Devices/Equipment:  None ADL Screening (condition at time of admission) Patient's cognitive ability adequate to safely complete daily activities?: Yes Is the patient deaf or have difficulty hearing?: No Does the patient have difficulty seeing, even when wearing glasses/contacts?: No Does the patient have difficulty concentrating, remembering, or making decisions?: No Patient able to express need for assistance with ADLs?: No Does the patient have difficulty dressing or bathing?: No Independently performs ADLs?: Yes (appropriate for developmental age) Does the patient have difficulty walking or climbing stairs?: No Weakness of Legs: None Weakness of Arms/Hands: None  Permission Sought/Granted   Permission granted to share information with : No              Emotional Assessment         Alcohol / Substance Use: Not Applicable Psych Involvement: Outpatient Provider, Yes (comment)  Admission diagnosis:  Suicidal ideation [R45.851] DKA (diabetic ketoacidoses) (HCC) [E11.10] DKA, type 2 (HCC) [E11.10] Diabetic ketoacidosis without coma associated with type 2 diabetes mellitus (HCC) [E11.10] Patient Active Problem List   Diagnosis Date Noted  . Urinary tract infection without hematuria   . Suicidal ideation 07/21/2020  . Pneumonia due to COVID-19 virus   . Homicidal ideation   . Bipolar affective disorder, current episode manic (HCC) 04/04/2020  .  Diabetes (HCC) 04/04/2020  . DKA (diabetic ketoacidoses) (HCC) 03/28/2020  . Depression 03/28/2020  . Hyponatremia 03/28/2020  . Elevated blood pressure reading 09/14/2018  . Acne 09/14/2018  . Morbid obesity (HCC) 03/22/2017  . Hx of bipolar disorder 03/22/2017  . Schizophrenia (HCC) 03/22/2017   PCP:  Patient, No Pcp Per Pharmacy:   [facility] [address] [place] - [address] [place] [address] Phone: [phone] Fax: [phone]     Social Determinants of Health (SDOH) Interventions    Readmission Risk  Interventions No flowsheet data found.

## 2020-07-22 NOTE — Progress Notes (Signed)
Patient ID: Cindy Leonard, female   DOB: 1996/08/23, 24 y.o.   MRN: [id number] [facility] PROGRESS NOTE  Cindy Leonard [id number] DOB: 1996-10-18 DOA: 07/20/2020 PCP: Patient, No Pcp Per  HPI/Subjective: Patient coughing a lot last night.  Asking for some cough medication.  Repeat chest x-ray showed Covid pneumonia.  She is breathing okay.  Off the insulin drip as of yesterday.  Low-grade temperature.  Objective: Vitals:   07/22/20 1120 07/22/20 1518  BP: 107/81 128/85  Pulse: 98 100  Resp: 16   Temp: 99.2 F (37.3 C) 99.4 F (37.4 C)  SpO2: 96% 94%    Intake/Output Summary (Last 24 hours) at 07/22/2020 1520 Last data filed at 07/22/2020 1500 Gross per 24 hour  Intake 912 ml  Output 200 ml  Net 712 ml   Filed Weights   07/20/20 1748  Weight: 135.6 kg    ROS: Review of Systems  Respiratory: Positive for shortness of breath. Negative for cough.   Cardiovascular: Negative for chest pain.  Gastrointestinal: Negative for abdominal pain, nausea and vomiting.   Exam: Physical Exam HENT:     Head: Normocephalic.     Nose: No mucosal edema.     Mouth/Throat:     Pharynx: No oropharyngeal exudate.  Eyes:     General: Lids are normal.     Conjunctiva/sclera: Conjunctivae normal.  Cardiovascular:     Rate and Rhythm: Normal rate and regular rhythm.     Heart sounds: Normal heart sounds, S1 normal and S2 normal.  Pulmonary:     Breath sounds: Examination of the right-lower field reveals decreased breath sounds. Examination of the left-lower field reveals decreased breath sounds. Decreased breath sounds present. No wheezing, rhonchi or rales.  Abdominal:     Palpations: Abdomen is soft.     Tenderness: There is no abdominal tenderness.  Musculoskeletal:     Right ankle: No swelling.     Left ankle: No swelling.  Skin:    General: Skin is warm.     Findings: No rash.  Neurological:     Mental Status: She is alert and oriented to person, place, and time.        Data Reviewed: Basic Metabolic Panel: Recent Labs  Lab 07/20/20 1755 07/20/20 2350 07/21/20 0455 07/22/20 1004  NA 132* 141 139 132*  K 4.4 4.3 4.0 3.8  CL 92* 100 109 98  CO2 24 24 20* 21*  GLUCOSE 798* 433* 287* 429*  BUN 20 16 11 12   CREATININE 1.29* 1.11* 0.82 0.96  CALCIUM 9.3 8.7* 7.2* 8.8*   CBC: Recent Labs  Lab 07/20/20 1755 07/21/20 0216  WBC 6.6 6.2  HGB 15.3* 13.9  HCT 46.3* 42.7  MCV 81.5 81.0  PLT 198 179    CBG: Recent Labs  Lab 07/21/20 1134 07/21/20 1701 07/21/20 2106 07/22/20 0759 07/22/20 1153  GLUCAP 309* 219* 304* 349* 311*    Recent Results (from the past 240 hour(s))  Urine culture     Status: Abnormal   Collection Time: 07/20/20  5:55 PM   Specimen: Urine, Random  Result Value Ref Range Status   Specimen Description   Final    URINE, RANDOM Performed at [facility], [address], [place], [place]    Special Requests   Final    NONE Performed at [facility], [address] [place], [place]    Culture (A)  Final    >=100,000 COLONIES/mL GROUP B STREP(S.AGALACTIAE)ISOLATED TESTING AGAINST  S. AGALACTIAE NOT ROUTINELY PERFORMED DUE TO PREDICTABILITY OF AMP/PEN/VAN SUSCEPTIBILITY. Performed at [facility], [address] [place], [place] [address]    Report Status 07/22/2020 FINAL  Final  SARS Coronavirus 2 by RT PCR (hospital order, performed in [facility] hospital lab) Nasopharyngeal Nasopharyngeal Swab     Status: Abnormal   Collection Time: 07/21/20 12:45 AM   Specimen: Nasopharyngeal Swab  Result Value Ref Range Status   SARS Coronavirus 2 POSITIVE (A) NEGATIVE Final    Comment: RESULT CALLED TO, READ BACK BY AND VERIFIED WITH: ASHTON PETERS 07/21/20 AT 0202 HS (NOTE) SARS-CoV-2 target nucleic acids are DETECTED  SARS-CoV-2 RNA is generally detectable in upper respiratory specimens  during the acute phase of infection.  Positive results are indicative  of the  presence of the identified virus, but do not rule out bacterial infection or co-infection with other pathogens not detected by the test.  Clinical correlation with patient history and  other diagnostic information is necessary to determine patient infection status.  The expected result is negative.  Fact Sheet for Patients:   BoilerBrush.com.cy   Fact Sheet for Healthcare Providers:   https://pope.com/    This test is not yet approved or cleared by the [place] FDA and  has been authorized for detection and/or diagnosis of SARS-CoV-2 by FDA under an Emergency Use Authorization (EUA).  This EUA will remain in effect (meaning this test  can be used) for the duration of  the COVID-19 declaration under Section 564(b)(1) of the Act, 21 U.S.C. section 360-bbb-3(b)(1), unless the authorization is terminated or revoked sooner.  Performed at [facility], [address], [place], [place] [address]      Studies: [facility] Chest [place] 1 View  Result Date: 07/22/2020 CLINICAL DATA:  Cough and shortness of breath.  COVID positive. EXAM: PORTABLE CHEST 1 VIEW COMPARISON:  Chest x-ray dated July 10, 2020. FINDINGS: The heart size and mediastinal contours are within normal limits. Normal pulmonary vascularity. Low lung volumes. Patchy opacities in both mid to lower lungs, greater on the left. No pleural effusion or pneumothorax. No acute osseous abnormality. IMPRESSION: 1. Multifocal pneumonia, consistent with clinical history of COVID-19 infection. Electronically Signed   By: Obie Dredge M.D.   On: 07/22/2020 09:34    Scheduled Meds: . albuterol  2 puff Inhalation Q6H  . vitamin C  250 mg Oral Daily  . clonazePAM  0.25 mg Oral BID  . docusate sodium  100 mg Oral BID  . enoxaparin (LOVENOX) injection  40 mg Subcutaneous Q12H  . haloperidol  5 mg Oral QHS  . insulin aspart  0-20 Units Subcutaneous TID WC  . insulin aspart  12 Units  Subcutaneous TID WC  . insulin glargine  52 Units Subcutaneous Daily  . lamoTRIgine  200 mg Oral Daily  . melatonin  5 mg Oral QHS  . zinc sulfate  220 mg Oral Daily   Continuous Infusions: . sodium chloride    . cefTRIAXone (ROCEPHIN)  IV 1 g (07/22/20 1316)  . famotidine (PEPCID) IV    . remdesivir 200 mg in sodium chloride 0.9% 250 mL IVPB     Followed by  . [START ON 07/23/2020] remdesivir 100 mg in NS 100 mL      Assessment/Plan:  1. COVID-19 pneumonia.  Patient received monoclonal antibody yesterday because her chest x-ray was negative.  Patient had more coughing overnight and repeat chest x-ray shows pneumonia.  Start remdesivir today.  Hold off on steroids  because she was admitted with diabetic ketoacidosis. 2. Diabetic ketoacidosis.  Patient off insulin drip yesterday.  Sugars still high in the 300s.  Increase glargine insulin up to 52 units daily with 12 units of short acting insulin prior to meals.  Hemoglobin A1c elevated at 14.7. 3. Involuntary committed with suicidal ideation in the emergency room.  Contacted psychiatry again to evaluate patient.  Patient with a history of bipolar disorder and schizophrenia.  Continue current psychiatric medications. 4. Obesity.  BMI of 46.83. 5. Group B strep UTI.  Patient given a dose of Rocephin in the emergency room will give 2 more doses of Rocephin.     Code Status:     Code Status Orders  (From admission, onward)         Start     Ordered   07/21/20 0201  Full code  Continuous        07/21/20 0201        Code Status History    Date Active Date Inactive Code Status Order ID Comments User Context   07/21/2020 0007 07/21/2020 0201 Full Code [id number]  Shaune Pollack, MD ED   04/20/2020 0220 04/20/2020 1628 Full Code [id number]  Nita Sickle, MD ED   04/04/2020 1059 04/08/2020 1806 Full Code [id number]  Audery Amel, MD Inpatient   03/28/2020 1526 04/04/2020 1050 Full Code [id number]  Lucile Shutters, MD ED   Advance Care  Planning Activity     Family Communication: Spoke with the patient's mother on the phone Disposition Plan: Status is: Inpatient  Dispo: The patient is from: Home              Anticipated d/c is to: Home              Anticipated d/c date is: Patient is under involuntary commitment.  Psychiatry needs to see prior to any disposition.  May be able to set up outpatient remdesivir infusions if doing better tomorrow.              Patient currently being treated for COVID-19 pneumonia.  Consultants:  Psychiatry  Antibiotics:  Rocephin  Time spent: 27 minutes  Dale Stra[employer]

## 2020-07-22 NOTE — Consult Note (Addendum)
Remdesivir - Pharmacy Brief Note   O:  Per chest Xray - "Low lung volumes. Patchy opacities in both mid to lower lungs, greater on the left - consistent with COVID-19 PNA"   A/P:  Remdesivir 200 mg IVPB once followed by 100 mg IVPB daily x 4 days.   Albina Billet, PharmD, BCPS Clinical Pharmacist 07/22/2020 10:57 AM

## 2020-07-23 DIAGNOSIS — N3000 Acute cystitis without hematuria: Secondary | ICD-10-CM

## 2020-07-23 LAB — GLUCOSE, CAPILLARY
Glucose-Capillary: 284 mg/dL — ABNORMAL HIGH (ref 70–99)
Glucose-Capillary: 312 mg/dL — ABNORMAL HIGH (ref 70–99)
Glucose-Capillary: 160 mg/dL — ABNORMAL HIGH (ref 70–99)
Glucose-Capillary: 162 mg/dL — ABNORMAL HIGH (ref 70–99)

## 2020-07-23 MED ORDER — INSULIN ASPART 100 UNIT/ML ~~LOC~~ SOLN
17.0000 [IU] | Freq: Three times a day (TID) | SUBCUTANEOUS | Status: DC
  Administered 2020-07-23 – 2020-07-25 (×7): 17 [IU] via SUBCUTANEOUS
  Filled 2020-07-23 (×7): qty 1

## 2020-07-23 MED ORDER — INSULIN GLARGINE 100 UNIT/ML ~~LOC~~ SOLN
60.0000 [IU] | Freq: Every day | SUBCUTANEOUS | Status: DC
  Administered 2020-07-24: 60 [IU] via SUBCUTANEOUS
  Filled 2020-07-23 (×2): qty 0.6

## 2020-07-23 NOTE — Progress Notes (Signed)
Inpatient Diabetes Program Recommendations  AACE/ADA: New Consensus Statement on Inpatient Glycemic Control   Target Ranges:  Prepandial:   less than 140 mg/dL      Peak postprandial:   less than 180 mg/dL (1-2 hours)      Critically ill patients:  140 - 180 mg/dL  Results for Cindy Leonard, Cindy Leonard (MRN [id number]) as of 07/23/2020 07:23  Ref. Range 07/22/2020 07:59 07/22/2020 11:53 07/22/2020 16:21 07/22/2020 21:22  Glucose-Capillary Latest Ref Range: 70 - 99 mg/dL 559 (H) 741 (H) 638 (H) 345 (H)    Review of Glycemic Control  Diabetes history: DM2 Outpatient Diabetes medications: Lantus 48 units QHS, Humalog 12 units TID with meals Current orders for Inpatient glycemic control: Lantus 52 units QHS, Novolog 12 units TID with meals, Novolog 0-20 units TID with meals  Inpatient Diabetes Program Recommendations:    Insulin: Please consider increasing Lantus to 60 units QHS, meal coverage to Novolog 17 units TID with meals, and add Novolog 0-5 units QHS for bedtime correction.  Thanks, Orlando Penner, RN, MSN, CDE Diabetes Coordinator Inpatient Diabetes Program [phone] (Team Pager from 8am to 5pm)

## 2020-07-23 NOTE — Progress Notes (Signed)
SATURATION QUALIFICATIONS: (This note is used to comply with regulatory documentation for home oxygen)  Patient Saturations on Room Air at Rest = 98%  Patient Saturations on Room Air while Ambulating = 91%  Patient able to ambulate 150 feet without c/o SOB, dyspnea, cough, dizziness or weakness on RA.

## 2020-07-23 NOTE — Progress Notes (Signed)
Patient ID: Titania Gault Jenison, female   DOB: [birth year], 24 y.o.   MRN: [id number] [facility] PROGRESS NOTE  Sandar Krinke Grotz [id number] DOB: [birth year] DOA: 07/20/2020 PCP: Patient, No Pcp Per  HPI/Subjective: Patient still has some cough but otherwise doing a little bit better. Still with some shortness of breath.  Admitted initially with diabetic ketoacidosis.  Objective: Vitals:   07/23/20 1139 07/23/20 1552  BP: 124/89 130/86  Pulse: 92 91  Resp: 18 16  Temp: 98.4 F (36.9 C) 98.1 F (36.7 C)  SpO2: 92% 93%    Intake/Output Summary (Last 24 hours) at 07/23/2020 1727 Last data filed at 07/22/2020 2109 Gross per 24 hour  Intake 340 ml  Output --  Net 340 ml   Filed Weights   07/20/20 1748  Weight: 135.6 kg    ROS: Review of Systems  Respiratory: Positive for cough and shortness of breath.   Cardiovascular: Negative for chest pain.  Gastrointestinal: Negative for abdominal pain, nausea and vomiting.   Exam: Physical Exam HENT:     Head: Normocephalic.     Mouth/Throat:     Pharynx: No oropharyngeal exudate.  Eyes:     General: Lids are normal.     Conjunctiva/sclera: Conjunctivae normal.  Cardiovascular:     Rate and Rhythm: Normal rate and regular rhythm.     Heart sounds: Normal heart sounds, S1 normal and S2 normal.  Pulmonary:     Breath sounds: Examination of the right-lower field reveals decreased breath sounds and rhonchi. Examination of the left-lower field reveals decreased breath sounds and rhonchi. Decreased breath sounds and rhonchi present. No wheezing or rales.  Abdominal:     Palpations: Abdomen is soft.     Tenderness: There is no abdominal tenderness.  Musculoskeletal:     Right lower leg: No swelling.     Left lower leg: No swelling.  Skin:    General: Skin is warm.     Findings: No rash.  Neurological:     Mental Status: She is alert and oriented to person, place, and time.       Data Reviewed: Basic Metabolic Panel: Recent  Labs  Lab 07/20/20 1755 07/20/20 2350 07/21/20 0455 07/22/20 1004  NA 132* 141 139 132*  K 4.4 4.3 4.0 3.8  CL 92* 100 109 98  CO2 24 24 20* 21*  GLUCOSE 798* 433* 287* 429*  BUN 20 16 11 12   CREATININE 1.29* 1.11* 0.82 0.96  CALCIUM 9.3 8.7* 7.2* 8.8*   CBC: Recent Labs  Lab 07/20/20 1755 07/21/20 0216  WBC 6.6 6.2  HGB 15.3* 13.9  HCT 46.3* 42.7  MCV 81.5 81.0  PLT 198 179    CBG: Recent Labs  Lab 07/22/20 1621 07/22/20 2122 07/23/20 0813 07/23/20 1211 07/23/20 1550  GLUCAP 219* 345* 284* 312* 160*    Recent Results (from the past 240 hour(s))  Urine culture     Status: Abnormal   Collection Time: 07/20/20  5:55 PM   Specimen: Urine, Random  Result Value Ref Range Status   Specimen Description   Final    URINE, RANDOM Performed at [facility], [address], [place], [place]    Special Requests   Final    NONE Performed at [facility], [address] [place], [place]    Culture (A)  Final    >=100,000 COLONIES/mL GROUP B STREP(S.AGALACTIAE)ISOLATED TESTING AGAINST S. AGALACTIAE NOT ROUTINELY PERFORMED DUE TO PREDICTABILITY OF AMP/PEN/VAN SUSCEPTIBILITY. Performed at [facility], [address], [place], [place] [address]    Report Status 07/22/2020 FINAL  Final  SARS Coronavirus 2 by RT PCR (hospital order, performed in [facility] hospital lab) Nasopharyngeal Nasopharyngeal Swab     Status: Abnormal   Collection Time: 07/21/20 12:45 AM   Specimen: Nasopharyngeal Swab  Result Value Ref Range Status   SARS Coronavirus 2 POSITIVE (A) NEGATIVE Final    Comment: RESULT CALLED TO, READ BACK BY AND VERIFIED WITH: ASHTON PETERS 07/21/20 AT 0202 HS (NOTE) SARS-CoV-2 target nucleic acids are DETECTED  SARS-CoV-2 RNA is generally detectable in upper respiratory specimens  during the acute phase of infection.  Positive results are indicative  of the presence of the identified virus, but do not rule  out bacterial infection or co-infection with other pathogens not detected by the test.  Clinical correlation with patient history and  other diagnostic information is necessary to determine patient infection status.  The expected result is negative.  Fact Sheet for Patients:   BoilerBrush.com.cy   Fact Sheet for Healthcare Providers:   https://pope.com/    This test is not yet approved or cleared by the [place] FDA and  has been authorized for detection and/or diagnosis of SARS-CoV-2 by FDA under an Emergency Use Authorization (EUA).  This EUA will remain in effect (meaning this test  can be used) for the duration of  the COVID-19 declaration under Section 564(b)(1) of the Act, 21 U.S.C. section 360-bbb-3(b)(1), unless the authorization is terminated or revoked sooner.  Performed at [facility], [address] [place], [place] [address]      Studies: [facility] Chest [place] 1 View  Result Date: 07/22/2020 CLINICAL DATA:  Cough and shortness of breath.  COVID positive. EXAM: PORTABLE CHEST 1 VIEW COMPARISON:  Chest x-ray dated July 10, 2020. FINDINGS: The heart size and mediastinal contours are within normal limits. Normal pulmonary vascularity. Low lung volumes. Patchy opacities in both mid to lower lungs, greater on the left. No pleural effusion or pneumothorax. No acute osseous abnormality. IMPRESSION: 1. Multifocal pneumonia, consistent with clinical history of COVID-19 infection. Electronically Signed   By: Obie Dredge M.D.   On: 07/22/2020 09:34    Scheduled Meds: . albuterol  2 puff Inhalation Q6H  . vitamin C  250 mg Oral Daily  . clonazePAM  0.5 mg Oral BID  . docusate sodium  100 mg Oral BID  . DULoxetine  20 mg Oral Daily  . enoxaparin (LOVENOX) injection  40 mg Subcutaneous Q12H  . haloperidol  7.5 mg Oral QHS  . insulin aspart  0-20 Units Subcutaneous TID WC  . insulin aspart  17 Units Subcutaneous TID  WC  . [START ON 07/24/2020] insulin glargine  60 Units Subcutaneous Daily  . lamoTRIgine  200 mg Oral Daily  . melatonin  5 mg Oral QHS  . trihexyphenidyl  1 mg Oral BID WC  . zinc sulfate  220 mg Oral Daily   Continuous Infusions: . sodium chloride    . famotidine (PEPCID) IV    . remdesivir 100 mg in NS 100 mL 100 mg (07/23/20 0843)    Assessment/Plan:  1. COVID-19 pneumonia.  Patient initially received monoclonal antibody because her initial chest x-ray was negative.  Patient then developed more coughing and repeat chest x-ray did show Covid pneumonia.  Today is remdesivir [place] 2.  Hold off on steroids because she does not have any hypoxia and came in with diabetic ketoacidosis.  I was trying to  see if we can set up outpatient remdesivir infusions to complete the course but she lives on her mother's couch in the living room. Her mother was hesitant on taking her back with Covid infection.  The patient does not drive and would need transportation to [place]. 2. Diabetic ketoacidosis.  Increase glargine insulin up to 60 units daily with 17 units short acting insulin prior to meals.  Hemoglobin A1c very elevated at 14.7. 3. Suicidal ideation.  Patient seen by psychiatry.  Case discussed with psychiatrist today okay to discharge from a psychiatric standpoint.  Psychiatrist made some adjustments in psychiatric medications yesterday and can continue upon discharge. 4. Obesity with a BMI of 46.83. 5. Group B strep UTI.  Finished up 3 doses of Rocephin today.      Code Status:     Code Status Orders  (From admission, onward)         Start     Ordered   07/21/20 0201  Full code  Continuous        07/21/20 0201        Code Status History    Date Active Date Inactive Code Status Order ID Comments User Context   07/21/2020 0007 07/21/2020 0201 Full Code [id number]  Shaune Pollack, MD ED   04/20/2020 0220 04/20/2020 1628 Full Code [id number]  Nita Sickle, MD ED   04/04/2020 1059  04/08/2020 1806 Full Code [id number]  Audery Amel, MD Inpatient   03/28/2020 1526 04/04/2020 1050 Full Code [id number]  Lucile Shutters, MD ED   Advance Care Planning Activity     Family Communication: Spoke with patient's mother on the phone Disposition Plan: Status is: Inpatient  Dispo: The patient is from: Home              Anticipated d/c is to: Unclear at this point.  Case discussed with transitional care team to look into other options.              Anticipated d/c date is: May be best to do 5 days of remdesivir here.  Completed [place] 2 today.              Patient currently being treated for COVID-19 pneumonia with IV remdesivir  Time spent: 27 minutes  Ellise Kovack [employer]

## 2020-07-24 LAB — GLUCOSE, CAPILLARY
Glucose-Capillary: 270 mg/dL — ABNORMAL HIGH (ref 70–99)
Glucose-Capillary: 361 mg/dL — ABNORMAL HIGH (ref 70–99)
Glucose-Capillary: 238 mg/dL — ABNORMAL HIGH (ref 70–99)
Glucose-Capillary: 67 mg/dL — ABNORMAL LOW (ref 70–99)
Glucose-Capillary: 115 mg/dL — ABNORMAL HIGH (ref 70–99)

## 2020-07-24 NOTE — Progress Notes (Addendum)
PROGRESS NOTE    Cindy Leonard  QJF:[id number] DOB: May 03, 1996 DOA: 07/20/2020 PCP: Patient, No Pcp Per   Chief complaint.  Shortness of breath Brief Narrative:  Cindy Leonard is a 24 y.o. female with medical history significant for bipolar mood disorder, schizophrenia, morbid obesity and poorly controlled type 2 diabetes, hospitalized in May for DKA who presents to the emergency room with complaints of malaise, frequent urination and high blood sugar readings.  Patient also had a suicide ideation at the time of admission.  Upon arriving the emergency room, he had a glucose level of 798, anion gap 16.  He was also positive for Covid.  Patient was placed on insulin drip, started on remdesivir.  Her anion gap closed fairly quickly.    9/22.  Patient condition still stable.  Obtain psychiatry consult for suicidal ideation with schizophrenia.   Assessment & Plan:   Principal Problem:   DKA (diabetic ketoacidoses) (HCC) Active Problems:   Morbid obesity (HCC)   Hx of bipolar disorder   Schizophrenia (HCC)   Suicidal ideation   Pneumonia due to COVID-19 virus   Urinary tract infection without hematuria  #1.  COVID-19 pneumonia. Condition is improving.  Short of breath has improved.  She has been receiving remdesivir.  Due to home condition, there is no option for her to receive outpatient with decimeter.  She had to be kept in the hospital until 9/24 to complete the course.  2.  Diabetic ketoacidosis with uncontrolled type 2 diabetes per Continue current insulin dose, dose increased yesterday.  4.  Schizophrenia with a suicidal ideation. Discussed with Dr. Smith Robert, patient does not have additional suicide ideation.  Medication adjusted.  5.  Group B strep UTI. Continue Rocephin.  Obtain 2 sets of blood culture.  6.  Morbid obesity. Follow.  7.  Acute kidney injury secondary to Covid. Present at admission.  Condition has resolved.    DVT prophylaxis: Lovenox Code Status:  Full Family Communication: None .   Status is: Inpatient  Remains inpatient appropriate because:Unsafe d/c plan   Dispo: The patient is from: Home              Anticipated d/c is to: Home              Anticipated d/c date is: 2 days              Patient currently is medically stable to d/c.        I/O last 3 completed shifts: In: 580 [P.O.:480; IV Piggyback:100] Out: -  No intake/output data recorded.     Consultants:   Psychiatry  Procedures: None  Antimicrobials:  Rocephin Subjective: Patient doing well today.  Short of breath much improved.  She has a cough, nonproductive. Does not have nausea vomiting diarrhea. No fever chills. No dysuria hematuria today. No headache or dizziness.   Objective: Vitals:   07/23/20 1950 07/24/20 0011 07/24/20 0441 07/24/20 0754  BP: 118/90 106/71 104/89 112/82  Pulse: 85 75 80 82  Resp: 20 20 18 14   Temp: 98.4 F (36.9 C) 97.9 F (36.6 C) 98.1 F (36.7 C) 98.4 F (36.9 C)  TempSrc: Oral Oral Oral Oral  SpO2: 94% 93% 93% 92%  Weight:      Height:        Intake/Output Summary (Last 24 hours) at 07/24/2020 0949 Last data filed at 07/24/2020 0400 Gross per 24 hour  Intake 240 ml  Output --  Net 240 ml   07/26/2020  07/20/20 1748  Weight: 135.6 kg    Examination:  General exam: Appears calm and comfortable, morbidly obese. Respiratory system: Clear to auscultation. Respiratory effort normal. Cardiovascular system: S1 & S2 heard, RRR. No JVD, murmurs, rubs, gallops or clicks. No pedal edema. Gastrointestinal system: Abdomen is nondistended, soft and nontender. No organomegaly or masses felt. Normal bowel sounds heard. Central nervous system: Alert and oriented. No focal neurological deficits. Extremities: Symmetric 5 x 5 power. Skin: No rashes, lesions or ulcers Psychiatry: Judgement and insight appear normal. Mood & affect appropriate.     Data Reviewed: I have personally reviewed following labs and  imaging studies  CBC: Recent Labs  Lab 07/20/20 1755 07/21/20 0216  WBC 6.6 6.2  HGB 15.3* 13.9  HCT 46.3* 42.7  MCV 81.5 81.0  PLT 198 179   Basic Metabolic Panel: Recent Labs  Lab 07/20/20 1755 07/20/20 2350 07/21/20 0455 07/22/20 1004  NA 132* 141 139 132*  K 4.4 4.3 4.0 3.8  CL 92* 100 109 98  CO2 24 24 20* 21*  GLUCOSE 798* 433* 287* 429*  BUN 20 16 11 12   CREATININE 1.29* 1.11* 0.82 0.96  CALCIUM 9.3 8.7* 7.2* 8.8*   GFR: Estimated Creatinine Clearance: 127.9 mL/min (by C-G formula based on SCr of 0.96 mg/dL). Liver Function Tests: No results for input(s): AST, ALT, ALKPHOS, BILITOT, PROT, ALBUMIN in the last 168 hours. No results for input(s): LIPASE, AMYLASE in the last 168 hours. No results for input(s): AMMONIA in the last 168 hours. Coagulation Profile: No results for input(s): INR, PROTIME in the last 168 hours. Cardiac Enzymes: No results for input(s): CKTOTAL, CKMB, CKMBINDEX, TROPONINI in the last 168 hours. BNP (last 3 results) No results for input(s): PROBNP in the last 8760 hours. HbA1C: No results for input(s): HGBA1C in the last 72 hours. CBG: Recent Labs  Lab 07/23/20 0813 07/23/20 1211 07/23/20 1550 07/23/20 2118 07/24/20 0755  GLUCAP 284* 312* 160* 162* 270*   Lipid Profile: No results for input(s): CHOL, HDL, LDLCALC, TRIG, CHOLHDL, LDLDIRECT in the last 72 hours. Thyroid Function Tests: No results for input(s): TSH, T4TOTAL, FREET4, T3FREE, THYROIDAB in the last 72 hours. Anemia Panel: No results for input(s): VITAMINB12, FOLATE, FERRITIN, TIBC, IRON, RETICCTPCT in the last 72 hours. Sepsis Labs: No results for input(s): PROCALCITON, LATICACIDVEN in the last 168 hours.  Recent Results (from the past 240 hour(s))  Urine culture     Status: Abnormal   Collection Time: 07/20/20  5:55 PM   Specimen: Urine, Random  Result Value Ref Range Status   Specimen Description   Final    URINE, RANDOM Performed at [facility],  [address], [place], [place]    Special Requests   Final    NONE Performed at [facility], [address] [place], [place]    Culture (A)  Final    >=100,000 COLONIES/mL GROUP B STREP(S.AGALACTIAE)ISOLATED TESTING AGAINST S. AGALACTIAE NOT ROUTINELY PERFORMED DUE TO PREDICTABILITY OF AMP/PEN/VAN SUSCEPTIBILITY. Performed at [facility], [address] [place], [place]    Report Status 07/22/2020 FINAL  Final  SARS Coronavirus 2 by RT PCR (hospital order, performed in [facility] hospital lab) Nasopharyngeal Nasopharyngeal Swab     Status: Abnormal   Collection Time: 07/21/20 12:45 AM   Specimen: Nasopharyngeal Swab  Result Value Ref Range Status   SARS Coronavirus 2 POSITIVE (A) NEGATIVE Final    Comment: RESULT CALLED TO, READ BACK BY AND VERIFIED WITH:  ASHTON PETERS 07/21/20 AT 0202 HS (NOTE) SARS-CoV-2 target nucleic acids are DETECTED  SARS-CoV-2 RNA is generally detectable in upper respiratory specimens  during the acute phase of infection.  Positive results are indicative  of the presence of the identified virus, but do not rule out bacterial infection or co-infection with other pathogens not detected by the test.  Clinical correlation with patient history and  other diagnostic information is necessary to determine patient infection status.  The expected result is negative.  Fact Sheet for Patients:   BoilerBrush.com.cy   Fact Sheet for Healthcare Providers:   https://pope.com/    This test is not yet approved or cleared by the [place] FDA and  has been authorized for detection and/or diagnosis of SARS-CoV-2 by FDA under an Emergency Use Authorization (EUA).  This EUA will remain in effect (meaning this test  can be used) for the duration of  the COVID-19 declaration under Section 564(b)(1) of the Act, 21 U.S.C. section 360-bbb-3(b)(1), unless the authorization  is terminated or revoked sooner.  Performed at [facility], [address], [place], [place] [address]          Radiology Studies: No results found.      Scheduled Meds: . albuterol  2 puff Inhalation Q6H  . vitamin C  250 mg Oral Daily  . clonazePAM  0.5 mg Oral BID  . docusate sodium  100 mg Oral BID  . DULoxetine  20 mg Oral Daily  . enoxaparin (LOVENOX) injection  40 mg Subcutaneous Q12H  . haloperidol  7.5 mg Oral QHS  . insulin aspart  0-20 Units Subcutaneous TID WC  . insulin aspart  17 Units Subcutaneous TID WC  . insulin glargine  60 Units Subcutaneous Daily  . lamoTRIgine  200 mg Oral Daily  . melatonin  5 mg Oral QHS  . trihexyphenidyl  1 mg Oral BID WC  . zinc sulfate  220 mg Oral Daily   Continuous Infusions: . sodium chloride    . famotidine (PEPCID) IV    . remdesivir 100 mg in NS 100 mL 100 mg (07/24/20 0915)     LOS: 3 days    Time spent: 26 minutes    Marrion Coy, MD [facility]   To contact the attending provider between 7A-7P or the covering provider during after hours 7P-7A, please log into the web site [email or web address] and access using universal [place] password for that web site. If you do not have the password, please call the hospital operator.  07/24/2020, 9:49 AM

## 2020-07-25 LAB — GLUCOSE, CAPILLARY
Glucose-Capillary: 154 mg/dL — ABNORMAL HIGH (ref 70–99)
Glucose-Capillary: 268 mg/dL — ABNORMAL HIGH (ref 70–99)
Glucose-Capillary: 102 mg/dL — ABNORMAL HIGH (ref 70–99)

## 2020-07-25 MED ORDER — HALOPERIDOL 0.5 MG PO TABS: 7.5000 mg | ORAL_TABLET | Freq: Every day | ORAL | 0 refills | Status: DC

## 2020-07-25 MED ORDER — SODIUM CHLORIDE 0.9 % IV SOLN: 100.0000 mg | Freq: Every day | INTRAVENOUS | Status: DC

## 2020-07-25 MED ORDER — INSULIN GLARGINE 100 UNIT/ML ~~LOC~~ SOLN
50.0000 [IU] | Freq: Every day | SUBCUTANEOUS | Status: DC
  Administered 2020-07-25: 09:00:00 50 [IU] via SUBCUTANEOUS
  Filled 2020-07-25 (×2): qty 0.5

## 2020-07-25 MED ORDER — ZINC SULFATE 220 (50 ZN) MG PO CAPS: 220.0000 mg | ORAL_CAPSULE | Freq: Every day | ORAL | 0 refills | Status: DC

## 2020-07-25 MED ORDER — DULOXETINE HCL 20 MG PO CPEP: 20.0000 mg | ORAL_CAPSULE | Freq: Every day | ORAL | 0 refills | Status: DC

## 2020-07-25 MED ORDER — ASCORBIC ACID 250 MG PO TABS
250.0000 mg | ORAL_TABLET | Freq: Every day | ORAL | 0 refills | Status: DC
Start: 2020-07-26 — End: 2022-02-11

## 2020-07-25 NOTE — Progress Notes (Signed)
Patient discharged to home.  Left unit via w/c, brother is waiting outside to take patient home.

## 2020-07-25 NOTE — Progress Notes (Signed)
PROGRESS NOTE    Cindy Leonard  JGO:[id number] DOB: 05/07/1996 DOA: 07/20/2020 PCP: Patient, No Pcp Per   Chief complaint.  Shortness of breath. Brief Narrative:  Cindy Leonard a 24 y.o.femalewith medical history significant forbipolar mood disorder, schizophrenia, morbid obesity and poorly controlled type 2 diabetes, hospitalized in May for DKA who presents to the emergency room with complaints of malaise, frequent urination and high blood sugar readings.  Patient also had a suicide ideation at the time of admission.  Upon arriving the emergency room, he had a glucose level of 798, anion gap 16.  He was also positive for Covid.  Patient was placed on insulin drip, started on remdesivir.  Her anion gap closed fairly quickly.    9/22.  Patient condition still stable.    Psychiatry has cleared patient for discharge with modification of medicines.  Assessment & Plan:   Principal Problem:   DKA (diabetic ketoacidoses) (HCC) Active Problems:   Morbid obesity (HCC)   Hx of bipolar disorder   Schizophrenia (HCC)   Suicidal ideation   Pneumonia due to COVID-19 virus   Urinary tract infection without hematuria  #1.  COVID-19 pneumonia. Condition had improved, but no discharge options at this time.  Final dose of remdesivir tomorrow, will discharge tomorrow after the infusion.  2.  Diabetic ketoacidosis with uncontrolled type 2 diabetes. Patient developed a mild hypoglycemia this morning, decrease Lantus dose.  3.  Schizophrenia with suicidal ideation. Continue current treatments.  4.  Group B strep UTI. Continue 1 more [place] of Rocephin.  Blood culture drawn yesterday is pending.  5.  Morbid obesity.  6.  Acute kidney injury injury secondary to Covid. Condition resolved.    DVT prophylaxis: Lovenox Code Status: Full Family Communication: None  .   Status is: Inpatient  Remains inpatient appropriate because:Unsafe d/c plan   Dispo: The patient is from: Home               Anticipated d/c is to: Home              Anticipated d/c date is: 1 [place]              Patient currently is medically stable to d/c.        I/O last 3 completed shifts: In: 340 [P.O.:240; IV Piggyback:100] Out: -  No intake/output data recorded.     Consultants:   None  Procedures: None  Antimicrobials: None  Subjective: Patient had some mild hypoglycemia this morning, Lantus dose will be decreased today. She states that she did not eat much at supper yesterday.  But still does not have any nausea vomiting or diarrhea.  No constipation. No significant short of breath or cough. No fever or chills.  Objective: Vitals:   07/24/20 1919 07/24/20 2330 07/25/20 0253 07/25/20 0815  BP: 114/73 100/67 117/74 102/76  Pulse: 68 67 69 82  Resp: 20 20 17 16   Temp: 98.3 F (36.8 C) 97.7 F (36.5 C) 98.3 F (36.8 C) 97.9 F (36.6 C)  TempSrc:    Oral  SpO2: 95% 95% 92% 94%  Weight:      Height:        Intake/Output Summary (Last 24 hours) at 07/25/2020 0849 Last data filed at 07/24/2020 1600 Gross per 24 hour  Intake 100 ml  Output --  Net 100 ml   Filed Weights   07/20/20 1748  Weight: 135.6 kg    Examination:  General exam: Appears calm and comfortable, morbid  obesity Respiratory system: Clear to auscultation. Respiratory effort normal. Cardiovascular system: S1 & S2 heard, RRR. No JVD, murmurs, rubs, gallops or clicks. No pedal edema. Gastrointestinal system: Abdomen is nondistended, soft and nontender. No organomegaly or masses felt. Normal bowel sounds heard. Central nervous system: Alert and oriented. No focal neurological deficits. Extremities: Symmetric  Skin: No rashes, lesions or ulcers Psychiatry:  Mood & affect appropriate.     Data Reviewed: I have personally reviewed following labs and imaging studies  CBC: Recent Labs  Lab 07/20/20 1755 07/21/20 0216  WBC 6.6 6.2  HGB 15.3* 13.9  HCT 46.3* 42.7  MCV 81.5 81.0  PLT 198 179    Basic Metabolic Panel: Recent Labs  Lab 07/20/20 1755 07/20/20 2350 07/21/20 0455 07/22/20 1004  NA 132* 141 139 132*  K 4.4 4.3 4.0 3.8  CL 92* 100 109 98  CO2 24 24 20* 21*  GLUCOSE 798* 433* 287* 429*  BUN 20 16 11 12   CREATININE 1.29* 1.11* 0.82 0.96  CALCIUM 9.3 8.7* 7.2* 8.8*   GFR: Estimated Creatinine Clearance: 127.9 mL/min (by C-G formula based on SCr of 0.96 mg/dL). Liver Function Tests: No results for input(s): AST, ALT, ALKPHOS, BILITOT, PROT, ALBUMIN in the last 168 hours. No results for input(s): LIPASE, AMYLASE in the last 168 hours. No results for input(s): AMMONIA in the last 168 hours. Coagulation Profile: No results for input(s): INR, PROTIME in the last 168 hours. Cardiac Enzymes: No results for input(s): CKTOTAL, CKMB, CKMBINDEX, TROPONINI in the last 168 hours. BNP (last 3 results) No results for input(s): PROBNP in the last 8760 hours. HbA1C: No results for input(s): HGBA1C in the last 72 hours. CBG: Recent Labs  Lab 07/24/20 1135 07/24/20 1549 07/24/20 2130 07/24/20 2239 07/25/20 0816  GLUCAP 361* 238* 67* 115* 154*   Lipid Profile: No results for input(s): CHOL, HDL, LDLCALC, TRIG, CHOLHDL, LDLDIRECT in the last 72 hours. Thyroid Function Tests: No results for input(s): TSH, T4TOTAL, FREET4, T3FREE, THYROIDAB in the last 72 hours. Anemia Panel: No results for input(s): VITAMINB12, FOLATE, FERRITIN, TIBC, IRON, RETICCTPCT in the last 72 hours. Sepsis Labs: No results for input(s): PROCALCITON, LATICACIDVEN in the last 168 hours.  Recent Results (from the past 240 hour(s))  Urine culture     Status: Abnormal   Collection Time: 07/20/20  5:55 PM   Specimen: Urine, Random  Result Value Ref Range Status   Specimen Description   Final    URINE, RANDOM Performed at [facility], [address] [place], [place]    Special Requests   Final    NONE Performed at [facility], [address]  [place], [place]    Culture (A)  Final    >=100,000 COLONIES/mL GROUP B STREP(S.AGALACTIAE)ISOLATED TESTING AGAINST S. AGALACTIAE NOT ROUTINELY PERFORMED DUE TO PREDICTABILITY OF AMP/PEN/VAN SUSCEPTIBILITY. Performed at [facility], [address] [place], [place]    Report Status 07/22/2020 FINAL  Final  SARS Coronavirus 2 by RT PCR (hospital order, performed in [facility] hospital lab) Nasopharyngeal Nasopharyngeal Swab     Status: Abnormal   Collection Time: 07/21/20 12:45 AM   Specimen: Nasopharyngeal Swab  Result Value Ref Range Status   SARS Coronavirus 2 POSITIVE (A) NEGATIVE Final    Comment: RESULT CALLED TO, READ BACK BY AND VERIFIED WITH: ASHTON PETERS 07/21/20 AT 0202 HS (NOTE) SARS-CoV-2 target nucleic acids are DETECTED  SARS-CoV-2 RNA is generally detectable in upper respiratory specimens  during  the acute phase of infection.  Positive results are indicative  of the presence of the identified virus, but do not rule out bacterial infection or co-infection with other pathogens not detected by the test.  Clinical correlation with patient history and  other diagnostic information is necessary to determine patient infection status.  The expected result is negative.  Fact Sheet for Patients:   BoilerBrush.com.cy   Fact Sheet for Healthcare Providers:   https://pope.com/    This test is not yet approved or cleared by the [place] FDA and  has been authorized for detection and/or diagnosis of SARS-CoV-2 by FDA under an Emergency Use Authorization (EUA).  This EUA will remain in effect (meaning this test  can be used) for the duration of  the COVID-19 declaration under Section 564(b)(1) of the Act, 21 U.S.C. section 360-bbb-3(b)(1), unless the authorization is terminated or revoked sooner.  Performed at [facility], [address] [place], [place] [address]   CULTURE, BLOOD (ROUTINE  X 2) w Reflex to ID Panel     Status: None (Preliminary result)   Collection Time: 07/24/20 12:32 PM   Specimen: BLOOD  Result Value Ref Range Status   Specimen Description BLOOD BLOOD LEFT HAND  Final   Special Requests   Final    BOTTLES DRAWN AEROBIC AND ANAEROBIC Blood Culture adequate volume   Culture   Final    NO GROWTH < 24 HOURS Performed at [facility], [address] [place], [place] [address]    Report Status PENDING  Incomplete  CULTURE, BLOOD (ROUTINE X 2) w Reflex to ID Panel     Status: None (Preliminary result)   Collection Time: 07/24/20  2:06 PM   Specimen: BLOOD  Result Value Ref Range Status   Specimen Description BLOOD BLOOD LEFT HAND  Final   Special Requests   Final    BOTTLES DRAWN AEROBIC AND ANAEROBIC Blood Culture adequate volume   Culture   Final    NO GROWTH < 24 HOURS Performed at [facility], [address] [place], [place] [address]    Report Status PENDING  Incomplete         Radiology Studies: No results found.      Scheduled Meds: . albuterol  2 puff Inhalation Q6H  . vitamin C  250 mg Oral Daily  . clonazePAM  0.5 mg Oral BID  . docusate sodium  100 mg Oral BID  . DULoxetine  20 mg Oral Daily  . enoxaparin (LOVENOX) injection  40 mg Subcutaneous Q12H  . haloperidol  7.5 mg Oral QHS  . insulin aspart  0-20 Units Subcutaneous TID WC  . insulin aspart  17 Units Subcutaneous TID WC  . insulin glargine  50 Units Subcutaneous Daily  . lamoTRIgine  200 mg Oral Daily  . melatonin  5 mg Oral QHS  . trihexyphenidyl  1 mg Oral BID WC  . zinc sulfate  220 mg Oral Daily   Continuous Infusions: . sodium chloride    . famotidine (PEPCID) IV    . remdesivir 100 mg in NS 100 mL Stopped (07/24/20 0945)     LOS: 4 days    Time spent: 28 minutes    Marrion Coy, MD [facility]   To contact the attending provider between 7A-7P or the covering provider during after hours 7P-7A, please log into the web  site [email or web address] and access using universal [place] password for that web site. If you do not have the  password, please call the hospital operator.  07/25/2020, 8:49 AM

## 2020-07-25 NOTE — Discharge Instructions (Signed)
1.  Follow-up with PCP in 1 week. 2.  Follow-up with a psychiatrist in 1 to 2 weeks. 3.  Self quarantine for at least 10 days.

## 2020-07-25 NOTE — Discharge Summary (Signed)
Physician Discharge Summary  Patient ID: Cindy Leonard MRN: [id number] DOB/AGE: 1996/07/25 24 y.o.  Admit date: 07/20/2020 Discharge date: 07/25/2020  Admission Diagnoses:  Discharge Diagnoses:  Principal Problem:   DKA (diabetic ketoacidoses) ([place]) Active Problems:   Morbid obesity ([place])   Hx of bipolar disorder   Schizophrenia ([place])   Suicidal ideation   Pneumonia due to COVID-19 virus   Urinary tract infection without hematuria   Discharged Condition: good  Hospital Course:  Cindy Leonard a 24 y.o.femalewith medical history significant forbipolar mood disorder, schizophrenia, morbid obesity and poorly controlled type 2 diabetes, hospitalized in May for DKA who presents to the emergency room with complaints of malaise, frequent urination and high blood sugar readings.Patient also had a suicide ideation at the time of admission.  Upon arriving the emergency room, he had a glucose level of 798, anion gap 16. He was also positive for Covid. Patient was placed on insulin drip, started on remdesivir. Her anion gap closed fairly quickly.   9/22.Patient condition still stable.   Psychiatry has cleared patient for discharge with modification of medicines.  9/23.  Patient insist to go home.  He will sign AMA if not discharged.  At this point, I will discharge patient with some adjustment in the psychiatric medicines.  #1.  COVID-19 pneumonia. Condition had improved.  Patient refused for the last dose of remdesivir tomorrow.  Is threatening sign AMA if not discharged, as a result, I will discharge her instead.  2.  Diabetic ketoacidosis with uncontrolled type 2 diabetes. Patient developed a mild hypoglycemia this morning, Lantus dose was decreased.  3.  Schizophrenia with suicidal ideation. Medication adjusted per recommendation from psychiatry.  4.  Group B strep UTI. Completed antibiotics.  Blood cultures so far no growth.  5.  Morbid obesity.  6.  Acute  kidney injury injury secondary to Covid. Condition resolved.    Consults: psychiatry  Significant Diagnostic Studies:   Treatments: Remdesivir, Insulin drip.  Discharge Exam: Blood pressure 102/76, pulse 82, temperature 97.9 F (36.6 C), temperature source Oral, resp. rate 16, height 5' 7" (1.702 m), weight 135.6 kg, last menstrual period 03/02/2020, SpO2 94 %, unknown if currently breastfeeding. General appearance: alert and cooperative Resp: clear to auscultation bilaterally Cardio: regular rate and rhythm, S1, S2 normal, no murmur, click, rub or gallop GI: soft, non-tender; bowel sounds normal; no masses,  no organomegaly Extremities: extremities normal, atraumatic, no cyanosis or edema  Disposition: Discharge disposition: 01-Home or Self Care       Discharge Instructions    Diet - low sodium heart healthy   Complete by: As directed    Increase activity slowly   Complete by: As directed      Allergies as of 07/25/2020      Reactions   Food Swelling   Eyes/throat swelling (blueberries)      Medication List    STOP taking these medications   traZODone 50 MG tablet Commonly known as: DESYREL     TAKE these medications   Accu-Chek Aviva Plus test strip Generic drug: glucose blood Use as instructed   Accu-Chek Aviva Plus w/Device Kit Use as directed   accu-chek soft touch lancets Use as instructed   ascorbic acid 250 MG tablet Commonly known as: VITAMIN C Take 1 tablet (250 mg total) by mouth daily. Start taking on: July 26, 2020   clonazePAM 0.25 MG disintegrating tablet Commonly known as: KLONOPIN Take 1 tablet (0.25 mg total) by mouth 2 (two) times daily.  docusate sodium 100 MG capsule Commonly known as: COLACE Take 1 capsule (100 mg total) by mouth 2 (two) times daily.   DULoxetine 20 MG capsule Commonly known as: CYMBALTA Take 1 capsule (20 mg total) by mouth daily. Start taking on: July 26, 2020   haloperidol 0.5 MG  tablet Commonly known as: HALDOL Take 15 tablets (7.5 mg total) by mouth at bedtime. What changed:   medication strength  how much to take   HumaLOG KwikPen 100 UNIT/ML KwikPen Generic drug: insulin lispro Inject 15 Units into the skin 3 (three) times daily.   lamoTRIgine 100 MG tablet Commonly known as: LAMICTAL Take 100 mg by mouth 2 (two) times daily.   Lantus SoloStar 100 UNIT/ML Solostar Pen Generic drug: insulin glargine Inject 45 Units into the skin at bedtime.   nystatin cream Commonly known as: MYCOSTATIN Apply topically 2 (two) times daily. To affected area   zinc sulfate 220 (50 Zn) MG capsule Take 1 capsule (220 mg total) by mouth daily. Start taking on: July 26, 2020      35 minutes  Signed: Sharen Hones 07/25/2020, 2:46 PM

## 2020-07-29 LAB — CULTURE, BLOOD (ROUTINE X 2)
Culture: NO GROWTH
Culture: NO GROWTH
Special Requests: ADEQUATE
Special Requests: ADEQUATE

## 2020-12-07 ENCOUNTER — Other Ambulatory Visit: Payer: Self-pay

## 2020-12-07 ENCOUNTER — Emergency Department (HOSPITAL_COMMUNITY): Payer: Medicaid Other

## 2020-12-07 ENCOUNTER — Inpatient Hospital Stay (HOSPITAL_COMMUNITY)
Admission: EM | Admit: 2020-12-07 | Discharge: 2020-12-09 | DRG: 571 | Disposition: A | Discharge status: Home / Self Care | Payer: Medicaid Other | Attending: Internal Medicine | Admitting: Internal Medicine

## 2020-12-07 DIAGNOSIS — E1010 Type 1 diabetes mellitus with ketoacidosis without coma: Secondary | ICD-10-CM

## 2020-12-07 DIAGNOSIS — B964 Proteus (mirabilis) (morganii) as the cause of diseases classified elsewhere: Secondary | ICD-10-CM | POA: Diagnosis present

## 2020-12-07 DIAGNOSIS — Z79899 Other long term (current) drug therapy: Secondary | ICD-10-CM

## 2020-12-07 DIAGNOSIS — F1290 Cannabis use, unspecified, uncomplicated: Secondary | ICD-10-CM | POA: Diagnosis present

## 2020-12-07 DIAGNOSIS — I10 Essential (primary) hypertension: Secondary | ICD-10-CM | POA: Diagnosis present

## 2020-12-07 DIAGNOSIS — Z91018 Allergy to other foods: Secondary | ICD-10-CM

## 2020-12-07 DIAGNOSIS — Z6841 Body Mass Index (BMI) 40.0 and over, adult: Secondary | ICD-10-CM

## 2020-12-07 DIAGNOSIS — Z823 Family history of stroke: Secondary | ICD-10-CM

## 2020-12-07 DIAGNOSIS — Z794 Long term (current) use of insulin: Secondary | ICD-10-CM

## 2020-12-07 DIAGNOSIS — F209 Schizophrenia, unspecified: Secondary | ICD-10-CM | POA: Diagnosis present

## 2020-12-07 DIAGNOSIS — Z8616 Personal history of COVID-19: Secondary | ICD-10-CM

## 2020-12-07 DIAGNOSIS — Z91128 Patient's intentional underdosing of medication regimen for other reason: Secondary | ICD-10-CM

## 2020-12-07 DIAGNOSIS — Z825 Family history of asthma and other chronic lower respiratory diseases: Secondary | ICD-10-CM

## 2020-12-07 DIAGNOSIS — Z833 Family history of diabetes mellitus: Secondary | ICD-10-CM

## 2020-12-07 DIAGNOSIS — F319 Bipolar disorder, unspecified: Secondary | ICD-10-CM | POA: Diagnosis present

## 2020-12-07 DIAGNOSIS — E1165 Type 2 diabetes mellitus with hyperglycemia: Secondary | ICD-10-CM

## 2020-12-07 DIAGNOSIS — L0211 Cutaneous abscess of neck: Principal | ICD-10-CM

## 2020-12-07 DIAGNOSIS — Z8249 Family history of ischemic heart disease and other diseases of the circulatory system: Secondary | ICD-10-CM

## 2020-12-07 DIAGNOSIS — Y92009 Unspecified place in unspecified non-institutional (private) residence as the place of occurrence of the external cause: Secondary | ICD-10-CM

## 2020-12-07 DIAGNOSIS — T383X6A Underdosing of insulin and oral hypoglycemic [antidiabetic] drugs, initial encounter: Secondary | ICD-10-CM | POA: Diagnosis present

## 2020-12-07 DIAGNOSIS — Z20822 Contact with and (suspected) exposure to covid-19: Secondary | ICD-10-CM | POA: Diagnosis present

## 2020-12-07 NOTE — ED Triage Notes (Signed)
Pt presents to ED POV. Pt c/o neck pain that began yesterday. Pt has raised, red,  hardened area around R side of neck. Pt has decreased ROM in neck. Pt AAO x4, no distal neuro s/s

## 2020-12-08 ENCOUNTER — Observation Stay (HOSPITAL_COMMUNITY): Payer: Medicaid Other | Admitting: Certified Registered"

## 2020-12-08 ENCOUNTER — Encounter (HOSPITAL_COMMUNITY): Payer: Self-pay | Admitting: Internal Medicine

## 2020-12-08 ENCOUNTER — Encounter (HOSPITAL_COMMUNITY): Admission: EM | Disposition: A | Discharge status: Home / Self Care | Payer: Self-pay | Source: Home / Self Care

## 2020-12-08 DIAGNOSIS — Z91018 Allergy to other foods: Secondary | ICD-10-CM | POA: Diagnosis not present

## 2020-12-08 DIAGNOSIS — F209 Schizophrenia, unspecified: Secondary | ICD-10-CM | POA: Diagnosis present

## 2020-12-08 DIAGNOSIS — I10 Essential (primary) hypertension: Secondary | ICD-10-CM | POA: Diagnosis present

## 2020-12-08 DIAGNOSIS — F1290 Cannabis use, unspecified, uncomplicated: Secondary | ICD-10-CM | POA: Diagnosis present

## 2020-12-08 DIAGNOSIS — L0211 Cutaneous abscess of neck: Secondary | ICD-10-CM

## 2020-12-08 DIAGNOSIS — Z20822 Contact with and (suspected) exposure to covid-19: Secondary | ICD-10-CM | POA: Diagnosis present

## 2020-12-08 DIAGNOSIS — Y92009 Unspecified place in unspecified non-institutional (private) residence as the place of occurrence of the external cause: Secondary | ICD-10-CM | POA: Diagnosis not present

## 2020-12-08 DIAGNOSIS — T383X6A Underdosing of insulin and oral hypoglycemic [antidiabetic] drugs, initial encounter: Secondary | ICD-10-CM | POA: Diagnosis present

## 2020-12-08 DIAGNOSIS — Z823 Family history of stroke: Secondary | ICD-10-CM | POA: Diagnosis not present

## 2020-12-08 DIAGNOSIS — Z79899 Other long term (current) drug therapy: Secondary | ICD-10-CM | POA: Diagnosis not present

## 2020-12-08 DIAGNOSIS — E1165 Type 2 diabetes mellitus with hyperglycemia: Secondary | ICD-10-CM | POA: Diagnosis present

## 2020-12-08 DIAGNOSIS — Z825 Family history of asthma and other chronic lower respiratory diseases: Secondary | ICD-10-CM | POA: Diagnosis not present

## 2020-12-08 DIAGNOSIS — Z6841 Body Mass Index (BMI) 40.0 and over, adult: Secondary | ICD-10-CM | POA: Diagnosis not present

## 2020-12-08 DIAGNOSIS — Z8249 Family history of ischemic heart disease and other diseases of the circulatory system: Secondary | ICD-10-CM | POA: Diagnosis not present

## 2020-12-08 DIAGNOSIS — Z8616 Personal history of COVID-19: Secondary | ICD-10-CM | POA: Diagnosis not present

## 2020-12-08 DIAGNOSIS — F319 Bipolar disorder, unspecified: Secondary | ICD-10-CM | POA: Diagnosis present

## 2020-12-08 DIAGNOSIS — Z833 Family history of diabetes mellitus: Secondary | ICD-10-CM | POA: Diagnosis not present

## 2020-12-08 DIAGNOSIS — B964 Proteus (mirabilis) (morganii) as the cause of diseases classified elsewhere: Secondary | ICD-10-CM | POA: Diagnosis present

## 2020-12-08 DIAGNOSIS — Z794 Long term (current) use of insulin: Secondary | ICD-10-CM | POA: Diagnosis not present

## 2020-12-08 DIAGNOSIS — Z91128 Patient's intentional underdosing of medication regimen for other reason: Secondary | ICD-10-CM | POA: Diagnosis not present

## 2020-12-08 HISTORY — PX: INCISION AND DRAINAGE ABSCESS: SHX5864

## 2020-12-08 LAB — BASIC METABOLIC PANEL
Anion gap: 16 — ABNORMAL HIGH (ref 5–15)
BUN: 7 mg/dL (ref 6–20)
CO2: 20 mmol/L — ABNORMAL LOW (ref 22–32)
Calcium: 9 mg/dL (ref 8.9–10.3)
Chloride: 97 mmol/L — ABNORMAL LOW (ref 98–111)
Creatinine, Ser: 0.86 mg/dL (ref 0.44–1.00)
GFR, Estimated: >60 mL/min (ref >60)
Glucose, Bld: 372 mg/dL — ABNORMAL HIGH (ref 70–99)
Potassium: 3.7 mmol/L (ref 3.5–5.1)
Sodium: 133 mmol/L — ABNORMAL LOW (ref 135–145)

## 2020-12-08 LAB — CBC WITH DIFFERENTIAL/PLATELET
Abs Immature Granulocytes: 0.03 10*3/uL (ref 0.00–0.07)
Basophils Absolute: 0 10*3/uL (ref 0.0–0.1)
Basophils Relative: 1 %
Eosinophils Absolute: 0.2 10*3/uL (ref 0.0–0.5)
Eosinophils Relative: 2 %
HCT: 42.1 % (ref 36.0–46.0)
Hemoglobin: 13.2 g/dL (ref 12.0–15.0)
Immature Granulocytes: 0 %
Lymphocytes Relative: 33 %
Lymphs Abs: 2.7 10*3/uL (ref 0.7–4.0)
MCH: 26.2 pg (ref 26.0–34.0)
MCHC: 31.4 g/dL (ref 30.0–36.0)
MCV: 83.7 fL (ref 80.0–100.0)
Monocytes Absolute: 0.7 10*3/uL (ref 0.1–1.0)
Monocytes Relative: 8 %
Neutro Abs: 4.6 10*3/uL (ref 1.7–7.7)
Neutrophils Relative %: 56 %
Platelets: 299 10*3/uL (ref 150–400)
RBC: 5.03 MIL/uL (ref 3.87–5.11)
RDW: 13.7 % (ref 11.5–15.5)
WBC: 8.2 10*3/uL (ref 4.0–10.5)
nRBC: 0 % (ref 0.0–0.2)

## 2020-12-08 LAB — GLUCOSE, CAPILLARY
Glucose-Capillary: 324 mg/dL — ABNORMAL HIGH (ref 70–99)
Glucose-Capillary: 320 mg/dL — ABNORMAL HIGH (ref 70–99)
Glucose-Capillary: 193 mg/dL — ABNORMAL HIGH (ref 70–99)
Glucose-Capillary: 225 mg/dL — ABNORMAL HIGH (ref 70–99)

## 2020-12-08 LAB — SARS CORONAVIRUS 2 BY RT PCR (HOSPITAL ORDER, PERFORMED IN ~~LOC~~ HOSPITAL LAB): SARS Coronavirus 2: NEGATIVE

## 2020-12-08 SURGERY — INCISION AND DRAINAGE, ABSCESS
Anesthesia: General | Laterality: Left

## 2020-12-08 MED ORDER — INSULIN ASPART 100 UNIT/ML ~~LOC~~ SOLN
0.0000 [IU] | Freq: Three times a day (TID) | SUBCUTANEOUS | Status: DC
  Administered 2020-12-08: 4 [IU] via SUBCUTANEOUS
  Administered 2020-12-09: 7 [IU] via SUBCUTANEOUS
  Administered 2020-12-09: 11 [IU] via SUBCUTANEOUS

## 2020-12-08 MED ORDER — DULOXETINE HCL 20 MG PO CPEP
20.0000 mg | ORAL_CAPSULE | Freq: Every day | ORAL | Status: DC
  Administered 2020-12-08 – 2020-12-09 (×2): 20 mg via ORAL
  Filled 2020-12-08 (×2): qty 1

## 2020-12-08 MED ORDER — HYDROMORPHONE HCL 1 MG/ML IJ SOLN
0.5000 mg | Freq: Once | INTRAMUSCULAR | Status: AC
  Administered 2020-12-08: 0.5 mg via INTRAVENOUS
  Filled 2020-12-08: qty 1

## 2020-12-08 MED ORDER — ONDANSETRON HCL 4 MG/2ML IJ SOLN
INTRAMUSCULAR | Status: DC | PRN
  Administered 2020-12-08: 4 mg via INTRAVENOUS

## 2020-12-08 MED ORDER — LAMOTRIGINE 100 MG PO TABS
100.0000 mg | ORAL_TABLET | Freq: Two times a day (BID) | ORAL | Status: DC
Start: 2020-12-08 — End: 2020-12-09
  Administered 2020-12-08 – 2020-12-09 (×2): 100 mg via ORAL
  Filled 2020-12-08 (×3): qty 1

## 2020-12-08 MED ORDER — LACTATED RINGERS IV SOLN: INTRAVENOUS | Status: DC

## 2020-12-08 MED ORDER — ENOXAPARIN SODIUM 40 MG/0.4ML ~~LOC~~ SOLN: 40.0000 mg | SUBCUTANEOUS | Status: DC

## 2020-12-08 MED ORDER — MIDAZOLAM HCL 5 MG/5ML IJ SOLN
INTRAMUSCULAR | Status: DC | PRN
  Administered 2020-12-08: 2 mg via INTRAVENOUS

## 2020-12-08 MED ORDER — DEXMEDETOMIDINE (PRECEDEX) IN NS 20 MCG/5ML (4 MCG/ML) IV SYRINGE
PREFILLED_SYRINGE | INTRAVENOUS | Status: AC
  Filled 2020-12-08: qty 5

## 2020-12-08 MED ORDER — INSULIN ASPART 100 UNIT/ML ~~LOC~~ SOLN
SUBCUTANEOUS | Status: AC
  Administered 2020-12-08: 15 [IU] via SUBCUTANEOUS
  Filled 2020-12-08: qty 1

## 2020-12-08 MED ORDER — MORPHINE SULFATE (PF) 2 MG/ML IV SOLN
1.0000 mg | INTRAVENOUS | Status: DC | PRN
  Administered 2020-12-09: 2 mg via INTRAVENOUS
  Filled 2020-12-08: qty 1

## 2020-12-08 MED ORDER — INSULIN ASPART 100 UNIT/ML ~~LOC~~ SOLN
SUBCUTANEOUS | Status: AC
  Administered 2020-12-08: 10 [IU] via SUBCUTANEOUS
  Filled 2020-12-08: qty 1

## 2020-12-08 MED ORDER — ORAL CARE MOUTH RINSE: 15.0000 mL | Freq: Once | OROMUCOSAL | Status: AC

## 2020-12-08 MED ORDER — SUGAMMADEX SODIUM 200 MG/2ML IV SOLN
INTRAVENOUS | Status: DC | PRN
  Administered 2020-12-08: 250 mg via INTRAVENOUS

## 2020-12-08 MED ORDER — OXYCODONE HCL 5 MG PO TABS
5.0000 mg | ORAL_TABLET | ORAL | Status: DC | PRN
Start: 2020-12-08 — End: 2020-12-09

## 2020-12-08 MED ORDER — FENTANYL CITRATE (PF) 250 MCG/5ML IJ SOLN
INTRAMUSCULAR | Status: AC
Start: 2020-12-08 — End: ?
  Filled 2020-12-08: qty 5

## 2020-12-08 MED ORDER — MIDAZOLAM HCL 2 MG/2ML IJ SOLN
INTRAMUSCULAR | Status: AC
  Filled 2020-12-08: qty 2

## 2020-12-08 MED ORDER — PROPOFOL 10 MG/ML IV BOLUS
INTRAVENOUS | Status: AC
  Filled 2020-12-08: qty 20

## 2020-12-08 MED ORDER — ACETAMINOPHEN 500 MG PO TABS
1000.0000 mg | ORAL_TABLET | Freq: Three times a day (TID) | ORAL | Status: DC
  Administered 2020-12-08 – 2020-12-09 (×3): 1000 mg via ORAL
  Filled 2020-12-08 (×3): qty 2

## 2020-12-08 MED ORDER — ALBUTEROL SULFATE HFA 108 (90 BASE) MCG/ACT IN AERS
INHALATION_SPRAY | RESPIRATORY_TRACT | Status: DC | PRN
  Administered 2020-12-08: 4 via RESPIRATORY_TRACT

## 2020-12-08 MED ORDER — ENOXAPARIN SODIUM 40 MG/0.4ML ~~LOC~~ SOLN
40.0000 mg | SUBCUTANEOUS | Status: DC
  Administered 2020-12-09: 40 mg via SUBCUTANEOUS
  Filled 2020-12-08: qty 0.4

## 2020-12-08 MED ORDER — BUPIVACAINE-EPINEPHRINE (PF) 0.25% -1:200000 IJ SOLN
INTRAMUSCULAR | Status: AC
  Filled 2020-12-08: qty 30

## 2020-12-08 MED ORDER — ONDANSETRON HCL 4 MG/2ML IJ SOLN
4.0000 mg | Freq: Once | INTRAMUSCULAR | Status: AC
  Administered 2020-12-08: 4 mg via INTRAVENOUS
  Filled 2020-12-08: qty 2

## 2020-12-08 MED ORDER — CHLORHEXIDINE GLUCONATE 0.12 % MT SOLN
OROMUCOSAL | Status: AC
  Administered 2020-12-08: 15 mL via OROMUCOSAL
  Filled 2020-12-08: qty 15

## 2020-12-08 MED ORDER — VANCOMYCIN HCL 2000 MG/400ML IV SOLN
2000.0000 mg | Freq: Once | INTRAVENOUS | Status: AC
  Administered 2020-12-08: 2000 mg via INTRAVENOUS
  Filled 2020-12-08 (×2): qty 400

## 2020-12-08 MED ORDER — DOCUSATE SODIUM 100 MG PO CAPS
100.0000 mg | ORAL_CAPSULE | Freq: Two times a day (BID) | ORAL | Status: DC
  Administered 2020-12-08 – 2020-12-09 (×2): 100 mg via ORAL
  Filled 2020-12-08 (×2): qty 1

## 2020-12-08 MED ORDER — FENTANYL CITRATE (PF) 100 MCG/2ML IJ SOLN: 25.0000 ug | INTRAMUSCULAR | Status: DC | PRN

## 2020-12-08 MED ORDER — FENTANYL CITRATE (PF) 250 MCG/5ML IJ SOLN
INTRAMUSCULAR | Status: DC | PRN
  Administered 2020-12-08 (×3): 50 ug via INTRAVENOUS
  Administered 2020-12-08: 100 ug via INTRAVENOUS

## 2020-12-08 MED ORDER — SUCCINYLCHOLINE CHLORIDE 200 MG/10ML IV SOSY
PREFILLED_SYRINGE | INTRAVENOUS | Status: DC | PRN
  Administered 2020-12-08: 140 mg via INTRAVENOUS

## 2020-12-08 MED ORDER — ACETAMINOPHEN 325 MG PO TABS: 650.0000 mg | ORAL_TABLET | Freq: Four times a day (QID) | ORAL | Status: DC | PRN

## 2020-12-08 MED ORDER — ROCURONIUM BROMIDE 10 MG/ML (PF) SYRINGE
PREFILLED_SYRINGE | INTRAVENOUS | Status: DC | PRN
  Administered 2020-12-08: 60 mg via INTRAVENOUS

## 2020-12-08 MED ORDER — DEXMEDETOMIDINE (PRECEDEX) IN NS 20 MCG/5ML (4 MCG/ML) IV SYRINGE
PREFILLED_SYRINGE | INTRAVENOUS | Status: DC | PRN
  Administered 2020-12-08 (×2): 8 ug via INTRAVENOUS

## 2020-12-08 MED ORDER — ACETAMINOPHEN 650 MG RE SUPP: 650.0000 mg | Freq: Four times a day (QID) | RECTAL | Status: DC | PRN

## 2020-12-08 MED ORDER — LIDOCAINE 2% (20 MG/ML) 5 ML SYRINGE
INTRAMUSCULAR | Status: DC | PRN
  Administered 2020-12-08: 50 mg via INTRAVENOUS

## 2020-12-08 MED ORDER — INSULIN ASPART 100 UNIT/ML ~~LOC~~ SOLN: 15.0000 [IU] | Freq: Once | SUBCUTANEOUS | Status: AC

## 2020-12-08 MED ORDER — PROPOFOL 10 MG/ML IV BOLUS
INTRAVENOUS | Status: DC | PRN
  Administered 2020-12-08: 200 mg via INTRAVENOUS

## 2020-12-08 MED ORDER — ALBUTEROL SULFATE HFA 108 (90 BASE) MCG/ACT IN AERS
INHALATION_SPRAY | RESPIRATORY_TRACT | Status: AC
  Filled 2020-12-08: qty 6.7

## 2020-12-08 MED ORDER — 0.9 % SODIUM CHLORIDE (POUR BTL) OPTIME
TOPICAL | Status: DC | PRN
  Administered 2020-12-08: 1000 mL

## 2020-12-08 MED ORDER — CLONAZEPAM 0.25 MG PO TBDP
0.2500 mg | ORAL_TABLET | Freq: Two times a day (BID) | ORAL | Status: DC
  Administered 2020-12-08 – 2020-12-09 (×2): 0.25 mg via ORAL
  Filled 2020-12-08 (×2): qty 1

## 2020-12-08 MED ORDER — CHLORHEXIDINE GLUCONATE 0.12 % MT SOLN: 15.0000 mL | Freq: Once | OROMUCOSAL | Status: AC

## 2020-12-08 MED ORDER — BUPIVACAINE-EPINEPHRINE 0.25% -1:200000 IJ SOLN
INTRAMUSCULAR | Status: DC | PRN
  Administered 2020-12-08: 10 mL

## 2020-12-08 MED ORDER — ONDANSETRON HCL 4 MG/2ML IJ SOLN: 4.0000 mg | Freq: Once | INTRAMUSCULAR | Status: DC | PRN

## 2020-12-08 MED ORDER — INSULIN GLARGINE 100 UNIT/ML ~~LOC~~ SOLN
45.0000 [IU] | Freq: Every day | SUBCUTANEOUS | Status: DC
  Administered 2020-12-08: 45 [IU] via SUBCUTANEOUS
  Filled 2020-12-08 (×2): qty 0.45

## 2020-12-08 MED ORDER — INSULIN ASPART 100 UNIT/ML ~~LOC~~ SOLN: 10.0000 [IU] | Freq: Once | SUBCUTANEOUS | Status: AC

## 2020-12-08 MED ORDER — VANCOMYCIN HCL 1250 MG/250ML IV SOLN
1250.0000 mg | Freq: Two times a day (BID) | INTRAVENOUS | Status: DC
  Administered 2020-12-08 – 2020-12-09 (×2): 1250 mg via INTRAVENOUS
  Filled 2020-12-08 (×3): qty 250

## 2020-12-08 MED ORDER — SODIUM CHLORIDE 0.9 % IV BOLUS
1000.0000 mL | Freq: Once | INTRAVENOUS | Status: AC
  Administered 2020-12-08: 1000 mL via INTRAVENOUS

## 2020-12-08 MED ORDER — VANCOMYCIN HCL IN DEXTROSE 1-5 GM/200ML-% IV SOLN: 1000.0000 mg | Freq: Once | INTRAVENOUS | Status: DC

## 2020-12-08 MED ORDER — ONDANSETRON HCL 4 MG/2ML IJ SOLN
INTRAMUSCULAR | Status: AC
  Filled 2020-12-08: qty 2

## 2020-12-08 SURGICAL SUPPLY — 39 items
BLADE CLIPPER SURG (BLADE) ×2 IMPLANT
CANISTER SUCT 3000ML PPV (MISCELLANEOUS) ×2 IMPLANT
CHLORAPREP W/TINT 26 (MISCELLANEOUS) ×2 IMPLANT
COVER SURGICAL LIGHT HANDLE (MISCELLANEOUS) ×4 IMPLANT
COVER WAND RF STERILE (DRAPES) ×2 IMPLANT
DECANTER SPIKE VIAL GLASS SM (MISCELLANEOUS) ×2 IMPLANT
DERMABOND ADVANCED (GAUZE/BANDAGES/DRESSINGS)
DERMABOND ADVANCED .7 DNX12 (GAUZE/BANDAGES/DRESSINGS) IMPLANT
DRAPE LAPAROTOMY T 102X78X121 (DRAPES) ×2 IMPLANT
DRSG TEGADERM 4X4.75 (GAUZE/BANDAGES/DRESSINGS) IMPLANT
ELECT REM PT RETURN 9FT ADLT (ELECTROSURGICAL) ×2
ELECTRODE REM PT RTRN 9FT ADLT (ELECTROSURGICAL) ×1 IMPLANT
GAUZE PACKING IODOFORM 1/2 (PACKING) ×2 IMPLANT
GAUZE SPONGE 4X4 12PLY STRL (GAUZE/BANDAGES/DRESSINGS) ×2 IMPLANT
GLOVE BIOGEL M STRL SZ7.5 (GLOVE) ×2 IMPLANT
GLOVE INDICATOR 8.0 STRL GRN (GLOVE) ×6 IMPLANT
GOWN STRL REUS W/ TWL LRG LVL3 (GOWN DISPOSABLE) ×1 IMPLANT
GOWN STRL REUS W/TWL 2XL LVL3 (GOWN DISPOSABLE) ×2 IMPLANT
GOWN STRL REUS W/TWL LRG LVL3 (GOWN DISPOSABLE) ×1
KIT BASIN OR (CUSTOM PROCEDURE TRAY) ×2 IMPLANT
KIT TURNOVER KIT B (KITS) ×2 IMPLANT
NEEDLE 18GX1X1/2 (RX/OR ONLY) (NEEDLE) ×4 IMPLANT
NEEDLE HYPO 25GX1X1/2 BEV (NEEDLE) ×2 IMPLANT
NS IRRIG 1000ML POUR BTL (IV SOLUTION) ×2 IMPLANT
PACK GENERAL/GYN (CUSTOM PROCEDURE TRAY) ×2 IMPLANT
PAD ABD 8X10 STRL (GAUZE/BANDAGES/DRESSINGS) ×2 IMPLANT
PAD ARMBOARD 7.5X6 YLW CONV (MISCELLANEOUS) ×4 IMPLANT
PENCIL SMOKE EVACUATOR (MISCELLANEOUS) ×2 IMPLANT
SPECIMEN JAR SMALL (MISCELLANEOUS) IMPLANT
SUT ETHILON 3 0 PS 1 (SUTURE) IMPLANT
SUT MNCRL AB 4-0 PS2 18 (SUTURE) IMPLANT
SUT VIC AB 3-0 SH 27 (SUTURE)
SUT VIC AB 3-0 SH 27X BRD (SUTURE) IMPLANT
SWAB COLLECTION DEVICE MRSA (MISCELLANEOUS) ×2 IMPLANT
SWAB CULTURE LIQUID MINI MALE (MISCELLANEOUS) ×2 IMPLANT
SYR 30ML LL (SYRINGE) ×2 IMPLANT
SYR CONTROL 10ML LL (SYRINGE) ×2 IMPLANT
TOWEL GREEN STERILE (TOWEL DISPOSABLE) ×2 IMPLANT
TOWEL GREEN STERILE FF (TOWEL DISPOSABLE) ×2 IMPLANT

## 2020-12-08 NOTE — Transfer of Care (Signed)
Immediate Anesthesia Transfer of Care Note  Patient: Azarria S Mcmeans  Procedure(s) Performed: INCISION AND DRAINAGE POSTERIOR NECK ABSCESS (Left )  Patient Location: PACU  Anesthesia Type:General  Level of Consciousness: awake and patient cooperative  Airway & Oxygen Therapy: Patient Spontanous Breathing and Patient connected to face mask oxygen  Post-op Assessment: Report given to RN and Post -op Vital signs reviewed and stable  Post vital signs: Reviewed and stable  Last Vitals:  Vitals Value Taken Time  BP 129/72 12/08/20 1421  Temp 36.1 C 12/08/20 1420  Pulse 82 12/08/20 1425  Resp 29 12/08/20 1425  SpO2 94 % 12/08/20 1425  Vitals shown include unvalidated device data.  Last Pain:  Vitals:   12/08/20 1236  TempSrc:   PainSc: 0-No pain      Patients Stated Pain Goal: 3 (12/08/20 1236)  Complications: No complications documented.

## 2020-12-08 NOTE — Progress Notes (Signed)
Pharmacy Antibiotic Note  Cindy Leonard is a 25 y.o. female admitted on 12/07/2020 with neck abscess.  Pharmacy has been consulted for vancomycin dosing.  Plan: Vancomycin 2000 mg IV x 1, then 1250 mg every 12 hours hours (Est AUC 435, Goal AUC 400-550, SCr 0.86) Monitor renal function, surg plans for I&D, LOT Vancomycin levels as needed     Temp (24hrs), Avg:98.9 F (37.2 C), Min:98.6 F (37 C), Max:99.2 F (37.3 C)  Recent Labs  Lab 12/08/20 0951  WBC 8.2  CREATININE 0.86    CrCl cannot be calculated (Unknown ideal weight.).    Allergies  Allergen Reactions  . Food Swelling    Eyes/throat swelling (blueberries)    Daylene Posey, PharmD Clinical Pharmacist ED Pharmacist Phone # [phone] 12/08/2020 11:31 AM

## 2020-12-08 NOTE — Progress Notes (Signed)
Patient has not yet Covid-19 vaccinated as of this time,refused Covid-19 vaccine when offered to her.

## 2020-12-08 NOTE — Op Note (Signed)
12/08/2020  2:01 PM  PATIENT:  Cindy Leonard  25 y.o. female  PRE-OPERATIVE DIAGNOSIS:  posterior neck abscess  POST-OPERATIVE DIAGNOSIS:  posterior neck abscess  PROCEDURE:  Procedure(s): INCISION AND DRAINAGE & EXCISIONAL DEBRIDEMENT OF POSTERIOR NECK ABSCESS 4 X 0.5 X 3.5 CM DEEP with scalpel  SURGEON:  Surgeon(s): Gaynelle Adu, MD  ASSISTANTS: none   ANESTHESIA:   general  DRAINS: none   LOCAL MEDICATIONS USED:  MARCAINE     SPECIMEN:  Aspirate  DISPOSITION OF SPECIMEN:  MICRO  COUNTS:  YES  INDICATION FOR PROCEDURE: Patient is a 25 year old female with severe obesity and diabetes who reportedly developed neck pain this morning.  She came to the emergency room where she was found to have findings consistent with a posterior neck cellulitis and induration.  She underwent a CT scan which demonstrated a posterior neck abscess.  We were asked to evaluate the patient.  Please see chart for additional details  PROCEDURE: Upper obtaining informed consent the patient was taken the operating room to at [facility] and placed supine on the operating room table.  General endotracheal anesthesia was established.  Sequential compression devices were placed.  She was then placed in the lateral position with the appropriate padding.  The back of her neck and upper back were then prepped and draped in the usual standard surgical fashion with ChloraPrep.  A surgical timeout was performed.  She had an area of 6 cm x 4 cm that was indurated with some cellulitis at the base of the posterior neck fold.  I aspirated the area with an 18-gauge needle and a 10 cc syringe.  I was able to easily aspirate 10 cc of purulent fluid.  This was sent off for analysis.  I then made an elliptical incision with a 15 blade approximately 4 cm long by half centimeter wide.  Deep dermis was divided.  There was more purulent material that flowed out.  The abscess cavity was approximately 3-1/2 cm deep.  The tissue  was very friable and oozy.  I probed the cavity with my finger.  It did not really tunnel or track anywhere else.  Cavity was irrigated with saline.  Hemostasis was then achieved with electrocautery.  The cavity was then packed with half-inch iodoform packing strip covered with gauze and ABD pad.  There were no immediate complications.  Needle, instrument and sponge counts were correct x2.  Patient tolerated the procedure well.  She was placed in the supine position and then extubated and taken to the recovery room in stable condition    Tool used for debridement (curette, scapel, etc.) scalpel    Frequency of surgical debridement.   Initial debridement   Measurement of total devitalized tissue (wound surface) before and after surgical debridement.   I left a 4 cm long by half centimeter wide opening in the skin    Area and depth of devitalized tissue removed from wound.  I excised a 4 cm long by half centimeter piece of skin and soft tissue.    Blood loss and description of tissue removed.  25 cc, skin and subcutaneous fat    Evidence of the progress of the wound's response to treatment.  A.  Current wound volume (current dimensions and depth).  4 cm by half centimeter wide by 3.5 cm deep  B.  Presence (and extent of) of infection.  Skin and soft tissue  C.  Presence (and extent of) of non viable tissue.  NoNE  D.  Other material in the wound that is expected to inhibit healing.  None immediately apparent    Was there any viable tissue removed (measurements): None   PLAN OF CARE: Admit to inpatient   PATIENT DISPOSITION:  PACU - hemodynamically stable.   Delay start of Pharmacological VTE agent (>24hrs) due to surgical blood loss or risk of bleeding:  no  Mary Sella. Andrey Campanile, MD, FACS General, Bariatric, & Minimally Invasive Surgery [facility], [place]

## 2020-12-08 NOTE — ED Notes (Signed)
Pt refused IV and Covid swab. States she only wants the abscess drained.

## 2020-12-08 NOTE — Anesthesia Preprocedure Evaluation (Signed)
Anesthesia Evaluation  Patient identified by MRN, date of birth, ID band Patient awake    Reviewed: Allergy & Precautions, NPO status , Patient's Chart, lab work & pertinent test results  Airway Mallampati: III  TM Distance: >3 FB Neck ROM: Full    Dental  (+) Teeth Intact, Dental Advisory Given   Pulmonary Current Smoker and Patient abstained from smoking.,    breath sounds clear to auscultation       Cardiovascular  Rhythm:Regular Rate:Tachycardia     Neuro/Psych    GI/Hepatic   Endo/Other    Renal/GU      Musculoskeletal   Abdominal (+) + obese,   Peds  Hematology   Anesthesia Other Findings   Reproductive/Obstetrics                             Anesthesia Physical Anesthesia Plan  ASA: III  Anesthesia Plan: General   Post-op Pain Management:    Induction: Intravenous, Cricoid pressure planned and Rapid sequence  PONV Risk Score and Plan: Ondansetron and Metaclopromide  Airway Management Planned: Oral ETT  Additional Equipment:   Intra-op Plan:   Post-operative Plan: Extubation in OR  Informed Consent: I have reviewed the patients History and Physical, chart, labs and discussed the procedure including the risks, benefits and alternatives for the proposed anesthesia with the patient or authorized representative who has indicated his/her understanding and acceptance.     Dental advisory given  Plan Discussed with: CRNA and Anesthesiologist  Anesthesia Plan Comments: (Large posterior neck abscess Obesity Poorly controlled DM  Plan GA with RSI  Kipp Brood)        Anesthesia Quick Evaluation

## 2020-12-08 NOTE — H&P (Signed)
Date: 12/08/2020               Patient Name:  Cindy Leonard MRN: [id number]  DOB: 04/04/1996 Age / Sex: 25 y.o., female   PCP: Patient, No Pcp Per         Medical Service: Internal Medicine Teaching Service         Attending Physician: Dr. Bonnetta Barry att. providers found    First Contact: Dr. Sharrell Ku Pager: [phone]  Second Contact: Dr. Elige Radon Pager: [phone]       After Hours (After 5p/  First Contact Pager: [phone]  weekends / holidays): Second Contact Pager: [phone]   Chief Complaint: Neck pain  History of Present Illness: Cindy Leonard is a 25 yo woman with PMH of T2DM, depression, bipolar and schizophrenia who presented for an evaluation of headache and neck pain. Patient states she started having a headache 2 days ago and last night, she woke up with a bump at the back of her neck. She has had associated neck pain and stiffness. She has difficulty moving her head in any direction. She endorse some polyuria but denies any blurry vision, fever, chills, N/V, abd pain, SOB or CP. She denies any history of abscesses. States reports blood sugars of 300s-400s at home. She takes her Lantus each night but sometimes misses some of her meal time coverage.   PCP at [facility]  Meds:  Current Meds  Medication Sig  . ascorbic acid (VITAMIN C) 250 MG tablet Take 1 tablet (250 mg total) by mouth daily.  . Cholecalciferol 25 MCG (1000 UT) tablet Take 1,000 Units by mouth daily.  . clonazePAM (KLONOPIN) 0.25 MG disintegrating tablet Take 1 tablet (0.25 mg total) by mouth 2 (two) times daily.  . DULoxetine (CYMBALTA) 20 MG capsule Take 1 capsule (20 mg total) by mouth daily.  . haloperidol (HALDOL) 0.5 MG tablet Take 15 tablets (7.5 mg total) by mouth at bedtime. (Patient taking differently: Take 0.5 mg by mouth at bedtime.)  . HUMALOG KWIKPEN 100 UNIT/ML KwikPen Inject 15 Units into the skin 3 (three) times daily.  Marland Kitchen lamoTRIgine (LAMICTAL) 100 MG tablet Take  100 mg by mouth 2 (two) times daily.  . Lancets (ACCU-CHEK SOFT TOUCH) lancets Use as instructed  . LANTUS SOLOSTAR 100 UNIT/ML Solostar Pen Inject 45 Units into the skin at bedtime.  . TRAZODONE HCL PO Take 1 tablet by mouth at bedtime.     Allergies: Allergies as of 12/07/2020 - Review Complete 12/07/2020  Allergen Reaction Noted  . Food Swelling 02/08/2017   Past Medical History:  Diagnosis Date  . Bipolar 1 disorder (HCC)   . Depression   . Schizophrenia (HCC)     Family History: Significant for diabetes and high blood pressure on her father side.   Social History: Lives with her grandmother. She is on disability because of her medical problems (T2DM, bipolar, schizophrenia). She endorse daily marijuana use but denies any other illicit drug use. Denies EtOH or cigarette use.   Review of Systems: A complete ROS was negative except as per HPI.   Physical Exam: Blood pressure 134/83, pulse 84, temperature 99.2 F (37.3 C), temperature source Oral, resp. rate 20, SpO2 98 %, unknown if currently breastfeeding.  General: Pleasant, obese young lady laying in bed. No acute distress. Head: Normocephalic. Atraumatic.  Neck: Limited ROM. Tender 12 x10 cm indurated area on posterior neck w/o purulent drainage. CV: RRR. No murmurs, rubs, or gallops. No LE  edema Pulmonary: Lungs CTAB. Normal effort. No wheezing or rales. Abdominal: Soft, nontender, nondistended. Normal bowel sounds. Extremities: Palpable pulses. Normal ROM. Skin: Warm and dry. No obvious rash or lesions. Neuro: A&Ox3. Moves all extremities. Normal sensation. No focal deficit. Psych: Anxious mood   Assessment & Plan by Problem: Active Problems:   Neck abscess  Cindy Leonard is a 25 yo woman with PMH of T2DM, depression, bipolar and schizophrenia who presented for an evaluation of headache and neck pain and found to have neck abscess. Now s/p I&D of abscess and on empirical abx.   Neck Abscess Patient w/  uncontrolled diabetes presented with 1-Salinger history of headache and neck pain. Found to have neck abscess on CT C-spine. S/p I&D by general surgery. On empirical abx with Vancomycin. No white count and remains afebrile.  --Surgery following, appreciate assistance --Continue IV Vancomycin --Tylenol 650 mg q6h for mild pain --Oxy 5 mg q4h prn for mod pain --IV morphine q2h prn for severe pain --Tylenol  --Pending tissue culture --Daily CBC  IDDM, uncontrolled Patient with uncontrolled T2DM found to have blood sugars in the 300 with BMP showing AG of 16. She endorse polyuria but no poly dipsia. Not completely compliant with her insulins as she reports missing dose of her meal time insulin. Blood sugar runs in the 300-400s at home.  --Lantus 45 u daily at bedtime --SSI, resistant --CBG monitoring --F/u A1C --F/u morning BMP  Depression Bipolar Schizophrenia Stable. On lamotrigine 100 mg BID, Cymbalta 20 mg daily and Klonopin 0.25 mg at home.  --Continue home meds  CODE STATUS: Full Code DIET: NPO PPx: Lovenox 40 mg subcu daily  Dispo: Admit patient to Inpatient with expected length of stay greater than 2 midnights.  Signed: Steffanie Rainwater, MD 12/08/2020, 12:02 PM  Pager: [phone] Internal Medicine Teaching Service After 5pm on weekdays and 1pm on weekends: On Call pager: [phone]

## 2020-12-08 NOTE — Anesthesia Postprocedure Evaluation (Signed)
Anesthesia Post Note  Patient: Cindy Leonard  Procedure(s) Performed: INCISION AND DRAINAGE POSTERIOR NECK ABSCESS (Left )     Patient location during evaluation: PACU Anesthesia Type: General Level of consciousness: awake and alert Pain management: pain level controlled Vital Signs Assessment: post-procedure vital signs reviewed and stable Respiratory status: spontaneous breathing, nonlabored ventilation, respiratory function stable and patient connected to nasal cannula oxygen Cardiovascular status: blood pressure returned to baseline and stable Postop Assessment: no apparent nausea or vomiting Anesthetic complications: no   No complications documented.  Last Vitals:  Vitals:   12/08/20 1630 12/08/20 1654  BP: 127/87 (!) 110/58  Pulse: 81 91  Resp: 14 18  Temp: 36.7 C 36.8 C  SpO2: 95% 95%    Last Pain:  Vitals:   12/08/20 1654  TempSrc: Oral  PainSc:                  [name] COKER

## 2020-12-08 NOTE — ED Provider Notes (Signed)
[facility] EMERGENCY DEPARTMENT Provider Note   CSN: [id number] Arrival date & time: 12/07/20  2124     History Chief Complaint  Patient presents with  . Neck Pain    Cindy Leonard is a 25 y.o. female.  The history is provided by the patient. No language interpreter was used.  Neck Pain Pain location:  Occipital region Quality:  Aching Pain severity:  Severe Pain is:  Same all the time Onset quality:  Gradual Timing:  Constant Progression:  Partially resolved Relieved by:  Nothing Worsened by:  Nothing Ineffective treatments:  None tried Associated symptoms: no fever   Pt reports area on her neck began swelling yesterday      Past Medical History:  Diagnosis Date  . Bipolar 1 disorder ([place])   . Depression   . Schizophrenia [facility])     Patient Active Problem List   Diagnosis Date Noted  . Urinary tract infection without hematuria   . Suicidal ideation 07/21/2020  . Pneumonia due to COVID-19 virus   . Homicidal ideation   . Bipolar affective disorder, current episode manic ([place]) 04/04/2020  . Diabetes ([place]) 04/04/2020  . DKA (diabetic ketoacidoses) 03/28/2020  . Depression 03/28/2020  . Hyponatremia 03/28/2020  . Elevated blood pressure reading 09/14/2018  . Acne 09/14/2018  . Morbid obesity ([place]) 03/22/2017  . Hx of bipolar disorder 03/22/2017  . Schizophrenia ([place]) 03/22/2017    No past surgical history on file.   OB History    Gravida  1   Para      Term      Preterm      AB      Living        SAB      IAB      Ectopic      Multiple      Live Births              Family History  Problem Relation Age of Onset  . Stroke Mother   . Diabetes Other   . Hypertension Other   . Asthma Father   . Asthma Brother     Social History   Tobacco Use  . Smoking status: Never Smoker  . Smokeless tobacco: Never Used  Vaping Use  . Vaping Use: Never used  Substance Use Topics  . Alcohol use: No  . Drug use: No     Comment: clean of cocaine x 3 yrs    Home Medications Prior to Admission medications   Medication Sig Start Date End Date Taking? Authorizing Provider  ascorbic acid (VITAMIN C) 250 MG tablet Take 1 tablet (250 mg total) by mouth daily. 07/26/20  Yes Sharen Hones, MD  Cholecalciferol 25 MCG (1000 UT) tablet Take 1,000 Units by mouth daily. 06/04/20  Yes [provider]  clonazePAM (KLONOPIN) 0.25 MG disintegrating tablet Take 1 tablet (0.25 mg total) by mouth 2 (two) times daily. 04/08/20  Yes Clapacs, Madie Reno, MD  DULoxetine (CYMBALTA) 20 MG capsule Take 1 capsule (20 mg total) by mouth daily. 07/26/20  Yes Sharen Hones, MD  haloperidol (HALDOL) 0.5 MG tablet Take 15 tablets (7.5 mg total) by mouth at bedtime. Patient taking differently: Take 0.5 mg by mouth at bedtime. 07/25/20  Yes Sharen Hones, MD  HUMALOG KWIKPEN 100 UNIT/ML KwikPen Inject 15 Units into the skin 3 (three) times daily. 07/03/20  Yes [provider]  lamoTRIgine (LAMICTAL) 100 MG tablet Take 100 mg by mouth 2 (two) times daily.  Yes [provider]  Lancets (ACCU-CHEK SOFT TOUCH) lancets Use as instructed 04/08/20  Yes Clapacs, Madie Reno, MD  LANTUS SOLOSTAR 100 UNIT/ML Solostar Pen Inject 45 Units into the skin at bedtime. 07/02/20  Yes [provider]  TRAZODONE HCL PO Take 1 tablet by mouth at bedtime.   Yes [provider]  Blood Glucose Monitoring Suppl (ACCU-CHEK AVIVA PLUS) w/Device KIT Use as directed 04/08/20   Clapacs, Madie Reno, MD  docusate sodium (COLACE) 100 MG capsule Take 1 capsule (100 mg total) by mouth 2 (two) times daily. Patient not taking: Reported on 12/08/2020 04/08/20   Clapacs, Madie Reno, MD  glucose blood (ACCU-CHEK AVIVA PLUS) test strip Use as instructed 04/08/20   Clapacs, Madie Reno, MD  nystatin cream (MYCOSTATIN) Apply topically 2 (two) times daily. To affected area Patient not taking: Reported on 12/08/2020 04/08/20   Clapacs, Madie Reno, MD  zinc sulfate 220 (50 Zn) MG capsule Take  1 capsule (220 mg total) by mouth daily. Patient not taking: Reported on 12/08/2020 07/26/20   Sharen Hones, MD    Allergies    Food  Review of Systems   Review of Systems  Constitutional: Negative for fever.  Musculoskeletal: Positive for neck pain.  All other systems reviewed and are negative.   Physical Exam Updated Vital Signs BP (!) 127/97 (BP Location: Right Arm)   Pulse 87   Temp 98.6 F (37 C) (Oral)   Resp 20   SpO2 99%   Physical Exam Vitals and nursing note reviewed.  Constitutional:      Appearance: She is well-developed and well-nourished.  HENT:     Head: Normocephalic.  Eyes:     Extraocular Movements: EOM normal.  Neck:     Comments: Swollen red area back of neck.  Pt can not turn her neck.  approx 12x10 cm area.  Cardiovascular:     Rate and Rhythm: Normal rate.     Pulses: Normal pulses.  Pulmonary:     Effort: Pulmonary effort is normal.  Abdominal:     General: There is no distension.  Musculoskeletal:        General: Normal range of motion.     Cervical back: Rigidity present.  Neurological:     Mental Status: She is alert and oriented to person, place, and time.  Psychiatric:        Mood and Affect: Mood and affect and mood normal.     ED Results / Procedures / Treatments   Labs (all labs ordered are listed, but only abnormal results are displayed) Labs Reviewed  BASIC METABOLIC PANEL - Abnormal; Notable for the following components:      Result Value   Sodium 133 (*)    Chloride 97 (*)    CO2 20 (*)    Glucose, Bld 372 (*)    Anion gap 16 (*)    All other components within normal limits  SARS CORONAVIRUS 2 BY RT PCR (HOSPITAL ORDER, [place] [facility])  CBC WITH DIFFERENTIAL/PLATELET    EKG None  Radiology CT Cervical Spine Wo Contrast  Result Date: 12/07/2020 CLINICAL DATA:  Neck pain beginning yesterday. Decreased range of motion. Soft tissue swelling. EXAM: CT CERVICAL SPINE WITHOUT CONTRAST TECHNIQUE:  Multidetector CT imaging of the cervical spine was performed without intravenous contrast. Multiplanar CT image reconstructions were also generated. COMPARISON:  None. FINDINGS: Alignment: Straightening of the normal cervical lordosis. Skull base and vertebrae: Normal Soft tissues and spinal canal: Abnormal fluid/soft  tissue density region in the posterior subcutaneous fat to the right of midline of the upper cervical region from about the level of C2 to C5. This collection extends from the skin surface down in contact the posterior paraspinal musculature. No evidence of deep space/intramuscular extension. There appears to be overlying skin thickening. In a person of this age, most likely diagnosis is soft tissue abscess. Tumor not excluded but not favored. There are some reactive regional enlarged lymph nodes. Disc levels: No suspicion of disc space pathology. Lower cervical and thoracic region detail is limited because of patient's size. Upper chest: Negative Other: None IMPRESSION: Abnormal fluid/soft tissue density region in the posterior subcutaneous fat to the right of midline from about the level of C2 to C5. This extends from the skin surface down in contact the posterior paraspinal musculature. No evidence of deep space/intramuscular extension. In a person of this age, most likely diagnosis is soft tissue abscess. Tumor not excluded but not favored. Some reactive regional nodal prominence. Electronically Signed   By: Nelson Chimes M.D.   On: 12/07/2020 22:17    Procedures Procedures   Medications Ordered in ED Medications  HYDROmorphone (DILAUDID) injection 0.5 mg (has no administration in time range)  ondansetron (ZOFRAN) injection 4 mg (has no administration in time range)    ED Course  I have reviewed the triage vital signs and the nursing notes.  Pertinent labs & imaging results that were available during my care of the patient were reviewed by me and considered in my medical decision  making (see chart for details).    MDM Rules/Calculators/A&P                          MDM:  I spoke to Surgery who will see here.  IV ns bolus, Unassigned medicine consulted to admit Final Clinical Impression(s) / ED Diagnoses Final diagnoses:  Abscess, neck  Diabetic ketoacidosis without coma associated with type 1 diabetes mellitus [facility])    Rx / DC Orders ED Discharge Orders    None       Sidney Ace 12/08/20 1124    Lucrezia Starch, MD 12/09/20 [phone]

## 2020-12-08 NOTE — Consult Note (Signed)
Cindy Leonard 04-26-1996  [id number].    Requesting MD: Trisha Mangle, PA-C Chief Complaint/Reason for Consult: Neck abscess  HPI:  Cindy Leonard is a 25 y.o. female with uncontrolled IDDM, reported HTN and documented hx of bipolar 1 disorder and schizophrenia who presented to the ED for neck pain. Patient reports that yesterday she started having stiffness and pain in her right neck. She reports pain with movement. She did not try anything for this. No associated fever, chills, n/v. She denies hx of similar symptoms in the past or hx of prior abscesses. She notes her sugars having been in the 400's at home this week. Patient is afebrile in the ED without tachycardia or hypotension. WBC 8.2. CT neck performed that is suspicious for a soft tissue abscess. EDP discussed with ENT, Dr. Suszanne Conners, who recommend gen surg consult. We were asked to see. Patient reports she has been NPO since last night. She is not on any blood thinners. She does have a PCP at [facility]. She has not been vaccinated against covid.   ROS: Review of Systems  Constitutional: Negative for chills and fever.  Respiratory: Negative for shortness of breath.   Cardiovascular: Negative for chest pain.  Gastrointestinal: Negative for abdominal pain, nausea and vomiting.  Musculoskeletal: Positive for neck pain.  Psychiatric/Behavioral: Positive for substance abuse.  All other systems reviewed and are negative.   Family History  Problem Relation Age of Onset  . Stroke Mother   . Diabetes Other   . Hypertension Other   . Asthma Father   . Asthma Brother     Past Medical History:  Diagnosis Date  . Bipolar 1 disorder (HCC)   . Depression   . Schizophrenia (HCC)     No past surgical history on file.  Social History:  reports that she has never smoked. She has never used smokeless tobacco. She reports that she does not drink alcohol and does not use drugs. No alcohol use Marijuana use daily  No  tobacco use   Allergies:  Allergies  Allergen Reactions  . Food Swelling    Eyes/throat swelling (blueberries)    (Not in a hospital admission)    Physical Exam: Blood pressure (!) 127/97, pulse 87, temperature 98.6 F (37 C), temperature source Oral, resp. rate 20, SpO2 99 %, unknown if currently breastfeeding. General: pleasant, WD/WN female who is laying in bed in NAD HEENT: head is normocephalic, atraumatic.  Sclera are noninjected.  PERRL.  Ears and nose without any masses or lesions.  Mouth is pink and moist. Dentition fair Neck: On right mid posterior neck there is a 5cm area of induration and firmness with underlying fluctuance. Some overlying blanchable erythema. No drainage.  Heart: regular, rate, and rhythm.  Normal s1,s2. No obvious murmurs, gallops, or rubs noted.  Palpable radial pulses bilaterally  Lungs: CTAB, no wheezes, rhonchi, or rales noted.  Respiratory effort nonlabored Abd: Obese, soft, NT/ND, +BS, no masses, hernias, or organomegaly MS: no BUE/BLE edema, calves soft and nontender Skin: Neck abscess as noted above, otherwise warm and dry with no masses, lesions, or rashes Psych: A&Ox4 with an appropriate affect Neuro: cranial nerves grossly intact, equal strength in BUE/BLE bilaterally, normal speech, thought process intact   Results for orders placed or performed during the hospital encounter of 12/07/20 (from the past 48 hour(s))  CBC with Differential/Platelet     Status: None   Collection Time: 12/08/20  9:51 AM  Result Value Ref Range  WBC 8.2 4.0 - 10.5 K/uL   RBC 5.03 3.87 - 5.11 MIL/uL   Hemoglobin 13.2 12.0 - 15.0 g/dL   HCT 88.2 80.0 - 34.9 %   MCV 83.7 80.0 - 100.0 fL   MCH 26.2 26.0 - 34.0 pg   MCHC 31.4 30.0 - 36.0 g/dL   RDW 17.9 15.0 - 56.9 %   Platelets 299 150 - 400 K/uL   nRBC 0.0 0.0 - 0.2 %   Neutrophils Relative % 56 %   Neutro Abs 4.6 1.7 - 7.7 K/uL   Lymphocytes Relative 33 %   Lymphs Abs 2.7 0.7 - 4.0 K/uL   Monocytes  Relative 8 %   Monocytes Absolute 0.7 0.1 - 1.0 K/uL   Eosinophils Relative 2 %   Eosinophils Absolute 0.2 0.0 - 0.5 K/uL   Basophils Relative 1 %   Basophils Absolute 0.0 0.0 - 0.1 K/uL   Immature Granulocytes 0 %   Abs Immature Granulocytes 0.03 0.00 - 0.07 K/uL    Comment: Performed at [facility], [address], [place], [place] [address]  Basic metabolic panel     Status: Abnormal   Collection Time: 12/08/20  9:51 AM  Result Value Ref Range   Sodium 133 (L) 135 - 145 mmol/L   Potassium 3.7 3.5 - 5.1 mmol/L   Chloride 97 (L) 98 - 111 mmol/L   CO2 20 (L) 22 - 32 mmol/L   Glucose, Bld 372 (H) 70 - 99 mg/dL    Comment: Glucose reference range applies only to samples taken after fasting for at least 8 hours.   BUN 7 6 - 20 mg/dL   Creatinine, Ser 1.65 0.44 - 1.00 mg/dL   Calcium 9.0 8.9 - 53.7 mg/dL   GFR, Estimated >48 >27 mL/min    Comment: (NOTE) Calculated using the CKD-EPI Creatinine Equation (2021)    Anion gap 16 (H) 5 - 15    Comment: Performed at [facility], [address], [place], [place] [address]   CT Cervical Spine Wo Contrast  Result Date: 12/07/2020 CLINICAL DATA:  Neck pain beginning yesterday. Decreased range of motion. Soft tissue swelling. EXAM: CT CERVICAL SPINE WITHOUT CONTRAST TECHNIQUE: Multidetector CT imaging of the cervical spine was performed without intravenous contrast. Multiplanar CT image reconstructions were also generated. COMPARISON:  None. FINDINGS: Alignment: Straightening of the normal cervical lordosis. Skull base and vertebrae: Normal Soft tissues and spinal canal: Abnormal fluid/soft tissue density region in the posterior subcutaneous fat to the right of midline of the upper cervical region from about the level of C2 to C5. This collection extends from the skin surface down in contact the posterior paraspinal musculature. No evidence of deep space/intramuscular extension. There appears to be overlying skin thickening. In a person  of this age, most likely diagnosis is soft tissue abscess. Tumor not excluded but not favored. There are some reactive regional enlarged lymph nodes. Disc levels: No suspicion of disc space pathology. Lower cervical and thoracic region detail is limited because of patient's size. Upper chest: Negative Other: None IMPRESSION: Abnormal fluid/soft tissue density region in the posterior subcutaneous fat to the right of midline from about the level of C2 to C5. This extends from the skin surface down in contact the posterior paraspinal musculature. No evidence of deep space/intramuscular extension. In a person of this age, most likely diagnosis is soft tissue abscess. Tumor not excluded but not favored. Some reactive regional nodal prominence. Electronically Signed   By: Loraine Leriche  Shogry M.D.   On: 12/07/2020 22:17   Anti-infectives (From admission, onward)   None      Assessment/Plan Reported HTN  Hx of bipolar 1 disorder  Hx schizophrenia  Uncontrolled IDDM - glucose 372 w/ anion gap acidosis 16 - Per primary -   Neck abscess  - Will plan for OR today for I&D of neck abscess - IV abx - NPO for procedure   FEN - NPO for procedure VTE - SCDs ID - Vanc 2/6 >>   Jacinto Halim, [facility] 12/08/2020, 11:10 AM Please see Amion for pager number during [place] hours 7:00am-4:30pm

## 2020-12-08 NOTE — ED Notes (Signed)
Pt being transported to short stay 

## 2020-12-08 NOTE — Progress Notes (Signed)
POC urine pregnancy test Negative. Unable to load in the computer.

## 2020-12-08 NOTE — Anesthesia Procedure Notes (Addendum)
Procedure Name: Intubation Date/Time: 12/08/2020 1:07 PM Performed by: Adria Dill, CRNA Pre-anesthesia Checklist: Patient identified, Emergency Drugs available, Suction available and Patient being monitored Patient Re-evaluated:Patient Re-evaluated prior to induction Oxygen Delivery Method: Circle system utilized Preoxygenation: Pre-oxygenation with 100% oxygen Induction Type: IV induction and Rapid sequence Ventilation: Mask ventilation without difficulty Laryngoscope Size: Miller and 2 Grade View: Grade I Tube type: Oral Tube size: 7.0 mm Number of attempts: 1 Airway Equipment and Method: Stylet and Oral airway Placement Confirmation: ETT inserted through vocal cords under direct vision,  positive ETCO2 and breath sounds checked- equal and bilateral Secured at: 21 cm Tube secured with: Tape Dental Injury: Teeth and Oropharynx as per pre-operative assessment

## 2020-12-08 NOTE — Progress Notes (Signed)
NEW ADMISSION NOTE New Admission Note:   Arrival Method: PACU stretcher bed Mental Orientation: Alert and oriented x 4 Telemetry: Non ordered. Assessment: Completed Skin:Intact ,with surgical incision on back of her neck EL:YHTMB a/c NSL PainDenies Tubes: None Safety Measures: Safety Fall Prevention Plan has been given, discussed and signed Admission: Completed [facility] Orientation: Patient has been orientated to the room, unit and staff.  Family:None at the bedside. one Orders have been reviewed and implemented. Will continue to monitor the patient. Call light has been placed within reach and bed alarm has been activated.   Ingenio, Kem Kays, RN

## 2020-12-09 ENCOUNTER — Encounter (HOSPITAL_COMMUNITY): Payer: Self-pay | Admitting: Pharmacist Clinician (PhC)/ Clinical Pharmacy Specialist

## 2020-12-09 ENCOUNTER — Encounter (HOSPITAL_COMMUNITY): Payer: Self-pay | Admitting: General Surgery

## 2020-12-09 DIAGNOSIS — L0211 Cutaneous abscess of neck: Principal | ICD-10-CM

## 2020-12-09 DIAGNOSIS — E1165 Type 2 diabetes mellitus with hyperglycemia: Secondary | ICD-10-CM

## 2020-12-09 LAB — BASIC METABOLIC PANEL
Anion gap: 11 (ref 5–15)
BUN: 7 mg/dL (ref 6–20)
CO2: 24 mmol/L (ref 22–32)
Calcium: 8.6 mg/dL — ABNORMAL LOW (ref 8.9–10.3)
Chloride: 101 mmol/L (ref 98–111)
Creatinine, Ser: 0.74 mg/dL (ref 0.44–1.00)
GFR, Estimated: >60 mL/min (ref >60)
Glucose, Bld: 278 mg/dL — ABNORMAL HIGH (ref 70–99)
Potassium: 3.5 mmol/L (ref 3.5–5.1)
Sodium: 136 mmol/L (ref 135–145)

## 2020-12-09 LAB — GLUCOSE, CAPILLARY
Glucose-Capillary: 273 mg/dL — ABNORMAL HIGH (ref 70–99)
Glucose-Capillary: 246 mg/dL — ABNORMAL HIGH (ref 70–99)

## 2020-12-09 LAB — CBC
HCT: 38.7 % (ref 36.0–46.0)
Hemoglobin: 12 g/dL (ref 12.0–15.0)
MCH: 26.2 pg (ref 26.0–34.0)
MCHC: 31 g/dL (ref 30.0–36.0)
MCV: 84.5 fL (ref 80.0–100.0)
Platelets: 264 10*3/uL (ref 150–400)
RBC: 4.58 MIL/uL (ref 3.87–5.11)
RDW: 13.9 % (ref 11.5–15.5)
WBC: 7.5 10*3/uL (ref 4.0–10.5)
nRBC: 0 % (ref 0.0–0.2)

## 2020-12-09 MED ORDER — OXYCODONE HCL 5 MG PO TABS: 5.0000 mg | ORAL_TABLET | ORAL | 0 refills | Status: DC | PRN

## 2020-12-09 MED ORDER — CIPROFLOXACIN HCL 250 MG PO TABS: 750.0000 mg | ORAL_TABLET | Freq: Two times a day (BID) | ORAL | 0 refills | Status: AC

## 2020-12-09 MED ORDER — HUMALOG KWIKPEN 100 UNIT/ML ~~LOC~~ SOPN: 20.0000 [IU] | PEN_INJECTOR | Freq: Three times a day (TID) | SUBCUTANEOUS | 11 refills | Status: DC

## 2020-12-09 MED ORDER — INSULIN ASPART 100 UNIT/ML ~~LOC~~ SOLN
10.0000 [IU] | Freq: Once | SUBCUTANEOUS | Status: AC
  Administered 2020-12-09: 10 [IU] via SUBCUTANEOUS

## 2020-12-09 MED ORDER — LANTUS SOLOSTAR 100 UNIT/ML ~~LOC~~ SOPN: 55.0000 [IU] | PEN_INJECTOR | Freq: Every day | SUBCUTANEOUS | 11 refills | Status: DC

## 2020-12-09 MED ORDER — SULFAMETHOXAZOLE-TRIMETHOPRIM 800-160 MG PO TABS: 1.0000 | ORAL_TABLET | Freq: Two times a day (BID) | ORAL | 0 refills | Status: AC

## 2020-12-09 MED ORDER — METFORMIN HCL 500 MG PO TABS: 1000.0000 mg | ORAL_TABLET | Freq: Two times a day (BID) | ORAL | 0 refills | Status: DC

## 2020-12-09 MED ORDER — SEMAGLUTIDE 3 MG PO TABS: 3.0000 mg | ORAL_TABLET | Freq: Every day | ORAL | 1 refills | Status: DC

## 2020-12-09 NOTE — Discharge Instructions (Signed)
Hello Shirly It was a pleasure taking care of you at [facility]. You were admitted for neck abscess and the abscess was drained by surgery. We are discharging you home on antibiotics and some changes to your insulin. Please follow-up with your primary care doctor continue to work on weight loss. 1) Increase Lantus to 55 units daily at bedtime 2) Increase NovoLog to 20 units 3 times a [place] with meals. 3) Start semaglutide 30 mg daily 4) Start ciprofloxacin 750 mg every 12 hours 5) Start Bactrim twice daily  Take care,  Dr. Sharrell Ku, MD, MPH   How to Change Your Wound Dressing  She can get in the shower and let the dressing get wet and pull in the shower, or she can pull it out before the shower.  She can let soap and water get into the wound.  After the shower pack the wound with a wet open 4 x 4.  Be sure packing is just inside the wound, not on the outer skin.  Dry dressing over the site.  Do the dressing change twice a [place].  A dressing is a material that is placed in and over a wound. A dressing helps your wound heal by protecting it from:  Germs (bacteria).  Another injury.  Getting too dry or too wet. There are several types of wound dressings. Some examples are:  Bandages.  Gauze pads.  Foam pads.  Antimicrobial dressings. These prevent or treat infection and may contain something that kills germs (an antiseptic), such as silver or iodine.  Calcium alginate. These are general guidelines. Follow your doctor's instructions about how to care for your wound. What are the risks? It is usually safe to change your dressing. The sticky (adhesive) tape that is used with a dressing may make your skin sore or irritated, or it may cause a rash. These are the most common problems. However, more serious problems can happen, such as:  Bleeding.  Infection. Supplies needed: Set up a clean area for wound care. You will need:  A trash bag that you can throw away. Have  it open and ready to use.  Hand sanitizer.  Cleaning solution as told by your doctor. This may include: ? Germ-free (sterile) water. ? Germ-free salt water (saline). ? Wound cleanser. ? New wound dressing material. Make sure to open the dressing package so the dressing stays on the inside of the package. You may also need the following in your clean area:  A box of vinyl gloves.  Tape.  Adhesive remover.  Skin protectant. This may be a wipe, film, or spray.  Clean or germ-free scissors.  A cotton-tipped applicator. How to change your dressing How often you change your dressing will depend on your wound. Change the dressing as often as told by your doctor. Your doctor may change your dressing, or a family member, friend, or caregiver may be shown how to do it. It is important to:  Wash your hands before and after each dressing change. If you cannot use soap and water, use hand sanitizer.  Wear gloves and protective clothing while changing a dressing. This may include eye protection.  Never let anyone change your dressing if he or she has an infection, a skin problem, or a skin wound or cut of any size. Preparing to change your dressing  Take a shower before you do the first dressing change of the [place]. Before you shower, ask your doctor if you should put plastic leak-proof sealing  wrap over your dressing to protect it.  If needed, take pain medicine 30 minutes before you change your dressing as told by your doctor. Taking off your old dressing  Wash your hands with soap and water. Dry your hands with a clean towel. If you cannot use soap and water, use hand sanitizer.  Go to the clean area that you have set up with all the supplies you will need.  If you are using gloves, put the gloves on before you take off the dressing.  Gently take off any adhesive or tape by pulling it off in the direction of your hair growth. Only touch the outside edges of the dressing. ? If you are  told to use an adhesive remover to loosen the edges of the dressing, make sure to avoid the wound area.  Take off the dressing. If the dressing sticks to your skin, wet the dressing with the cleaner that your doctor says to use. This helps it come off more easily.  Take off any gauze or packing in your wound.  Throw the old dressing supplies into the trash bag.  Take off your gloves. To take off each glove, grab the cuff with your other hand and turn the glove inside out. Put the gloves in the trash right away.  Wash your hands with soap and water. Dry your hands with a clean towel. If you cannot use soap and water, use hand sanitizer.   Cleaning your wound  Follow instructions from your doctor about how to clean your wound. This may include using the cleaner that your doctor recommends. ? You may need to use germ-free water to clean your wound if you are putting on a dressing that has silver in it.  Do not use over-the-counter medicated or antiseptic creams, sprays, liquids, or dressings unless your doctor tells you to do that.  Use a clean gauze pad to clean the area fully with the cleaner that your doctor recommends.  Throw the gauze pad into the trash bag.  Wash your hands with soap and water. Dry your hands with a clean towel. If you cannot use soap and water, use hand sanitizer. Putting on the dressing  If your doctor recommended a skin protectant, put it on the skin around the wound.  Gently pack the wound if told by your doctor.  Cover the wound with the recommended dressing. Make sure to touch only the outside edges of the dressing. Do not touch the inside of the dressing.  Attach the dressing so all sides stay in place. You may do this with medical adhesive, roll gauze, or tape. If you use tape, do not wrap the tape all the way around your arm or leg.  Take off your gloves. Put them in the trash bag with the old dressing. Tie the bag shut and throw it away.  Wash your  hands with soap and water. Dry your hands with a clean towel. If you cannot use soap and water, use hand sanitizer. Follow these instructions at home: Wound care  Check your wound every Selle for signs of infection, or as often as told by your doctor. Check for: ? More redness, swelling, or pain. ? More fluid or blood. ? Warmth. ? Pus or a bad smell.   General instructions  Take over-the-counter and prescription medicines only as told by your doctor.  Ask your doctor if the medicine prescribed to you: ? Requires you to avoid driving or using heavy machinery. ? Can  cause trouble pooping (constipation). You may need to take steps to prevent or treat trouble pooping:  Drink enough fluid to keep your pee (urine) pale yellow.  Take over-the-counter or prescription medicines.  Eat foods that are high in fiber. These include beans, whole grains, and fresh fruits and vegetables.  Limit foods that are high in fat and sugar. These include fried or sweet foods.  Keep all follow-up visits as told by your doctor. This is important. Contact a doctor if:  You have new pain.  You have irritation, a rash, or itching around the wound or dressing.  It hurts to change your dressing.  Changing your dressing causes a lot of bleeding. Get help right away if:  You have very bad pain.  You have signs of infection, such as: ? More redness, swelling, or pain. ? More fluid or blood. ? Warmth. ? Pus or a bad smell. ? Red streaks leading from the wound. ? A fever. Summary  A dressing is a material that is placed in and over a wound.  A dressing helps your wound heal.  Wash your hands before and after each dressing change.  Follow your doctor's instructions about how to care for your wound.  Check your wound for signs of infection. This information is not intended to replace advice given to you by your health care provider. Make sure you discuss any questions you have with your health care  provider. Document Revised: 06/16/2018 Document Reviewed: 06/16/2018 Elsevier Patient Education  2021 [employer].

## 2020-12-09 NOTE — Progress Notes (Incomplete)
HD#1 Subjective:  Overnight Events: No acute events overnight   ***  Objective:  Vital signs in last 24 hours: Vitals:   12/08/20 1654 12/08/20 2120 12/09/20 0224 12/09/20 0441  BP: (!) 110/58 108/69 106/69 124/84  Pulse: 91 78 76 72  Resp: 18 18 14 17   Temp: 98.2 F (36.8 C) 98.6 F (37 C) 98.6 F (37 C) 99 F (37.2 C)  TempSrc: Oral Oral Oral Oral  SpO2: 95% 97% 94% 98%  Weight:      Height:       Supplemental O2: Room Air SpO2: 98 % O2 Flow Rate (L/min): 2 L/min  Physical Exam:   General: Pleasant, well-appearing *** laying in bed. No acute distress. Head: Normocephalic. Atraumatic. CV: RRR. No m/r/g. No LE edema Pulmonary: Lungs CTAB. Normal effort. No wheezing or rales. Abdominal: Soft, nontender, nondistended. Normal bowel sounds. Extremities: Palpable dorsalis pedis and radialis pulses. Normal ROM. Skin: Warm and dry. No obvious rash or lesions. Neuro: A&Ox3. Moves all extremities. Normal sensation. No focal deficit. Psych: Normal mood and affect  Filed Weights   12/08/20 1236  Weight: 130.7 kg     Intake/Output Summary (Last 24 hours) at 12/09/2020 0703 Last data filed at 12/09/2020 0600 Gross per 24 hour  Intake 1570 ml  Output 100 ml  Net 1470 ml   Net IO Since Admission: 1,470 mL [12/09/20 0703]  Recent Labs    12/08/20 1433 12/08/20 1725 12/08/20 2142  GLUCAP 320* 193* 225*     Pertinent Labs: CBC Latest Ref Rng & Units 12/09/2020 12/08/2020 07/21/2020  WBC 4.0 - 10.5 K/uL 7.5 8.2 6.2  Hemoglobin 12.0 - 15.0 g/dL 07/23/2020 09.3 81.8  Hematocrit 36.0 - 46.0 % 38.7 42.1 42.7  Platelets 150 - 400 K/uL 264 299 179    CMP Latest Ref Rng & Units 12/09/2020 12/08/2020 07/22/2020  Glucose 70 - 99 mg/dL 07/24/2020) 371(I) 967(E)  BUN 6 - 20 mg/dL 7 7 12   Creatinine 0.44 - 1.00 mg/dL 938(B 0.17  Sodium 135 - 145 mmol/L 136 133(L) 132(L)  Potassium 3.5 - 5.1 mmol/L 3.5 3.7 3.8  Chloride 98 - 111 mmol/L 101 97(L) 98  CO2 22 - 32 mmol/L 24 20(L) 21(L)   Calcium 8.9 - 10.3 mg/dL 5.10) 9.0 2.58)  Total Protein 6.5 - 8.1 g/dL - - -  Total Bilirubin 0.3 - 1.2 mg/dL - - -  Alkaline Phos 38 - 126 U/L - - -  AST 15 - 41 U/L - - -  ALT 0 - 44 U/L - - -    Imaging: No results found.  Assessment/Plan:   Active Problems:   Neck abscess   Patient Summary: Cindy Leonard is a 25 y.o. with PMH of T2DM, depression, bipolar and schizophrenia who presented for an evaluation of headache and neck pain and found to have neck abscess. Now s/p I&D of abscess and on empirical abx.   Neck Abscess Patient w/ uncontrolled diabetes presented with 1-Raymond history of headache and neck pain. Found to have neck abscess on CT C-spine. S/p I&D by general surgery. On empirical abx with Vancomycin. No white count and remains afebrile.  --Surgery following, appreciate assistance --Continue IV Vancomycin --Tylenol 650 mg q6h for mild pain --Oxy 5 mg q4h prn for mod pain --IV morphine q2h prn for severe pain --Tylenol  --Pending tissue culture --Daily CBC  IDDM, uncontrolled Patient with uncontrolled T2DM found to have blood sugars in the 300 with BMP showing AG of 16.  She endorse polyuria but no poly dipsia. Not completely compliant with her insulins as she reports missing dose of her meal time insulin. Blood sugar runs in the 300-400s at home.  --Lantus 45 u daily at bedtime --SSI, resistant --CBG monitoring --F/u A1C --F/u morning BMP  Depression Bipolar Schizophrenia Stable. On lamotrigine 100 mg BID, Cymbalta 20 mg daily and Klonopin 0.25 mg at home.  --Continue home meds    Diet: CM IVF: PO intake VTE: enoxaparin (LOVENOX) injection 40 mg Start: 12/09/20 1400 Code: Full PT/OT: None ID:  Anti-infectives (From admission, onward)   Start     Dose/Rate Route Frequency Ordered Stop   12/08/20 2200  vancomycin (VANCOREADY) IVPB 1250 mg/250 mL        1,250 mg 166.7 mL/hr over 90 Minutes Intravenous Every 12 hours 12/08/20 1143     12/08/20  1145  vancomycin (VANCOREADY) IVPB 2000 mg/400 mL        2,000 mg 200 mL/hr over 120 Minutes Intravenous  Once 12/08/20 1143 12/08/20 1544   12/08/20 1130  vancomycin (VANCOCIN) IVPB 1000 mg/200 mL premix  Status:  Discontinued        1,000 mg 200 mL/hr over 60 Minutes Intravenous  Once 12/08/20 1119 12/08/20 1143       Anticipated discharge to Home in 1-2 days pending medical work-up.  Steffanie Rainwater, MD 12/09/2020, 7:03 AM Pager: [phone] [facility]  Please contact the on call pager after 5 pm and on weekends at [phone].

## 2020-12-09 NOTE — Plan of Care (Signed)
  Problem: Health Behavior/Discharge Planning: Goal: Ability to manage health-related needs will improve 12/09/2020 1609 by Katrina Stack, RN Outcome: Adequate for Discharge 12/09/2020 1609 by Katrina Stack, RN Outcome: Adequate for Discharge 12/09/2020 [id number] by Katrina Stack, RN Outcome: Progressing   Problem: Clinical Measurements: Goal: Ability to maintain clinical measurements within normal limits will improve 12/09/2020 1609 by Katrina Stack, RN Outcome: Adequate for Discharge 12/09/2020 1609 by Katrina Stack, RN Outcome: Adequate for Discharge Goal: Will remain free from infection 12/09/2020 1609 by Katrina Stack, RN Outcome: Adequate for Discharge 12/09/2020 1609 by Katrina Stack, RN Outcome: Adequate for Discharge 12/09/2020 3875 by Katrina Stack, RN Outcome: Progressing Goal: Diagnostic test results will improve 12/09/2020 1609 by Katrina Stack, RN Outcome: Adequate for Discharge 12/09/2020 1609 by Katrina Stack, RN Outcome: Adequate for Discharge Goal: Respiratory complications will improve 12/09/2020 1609 by Katrina Stack, RN Outcome: Adequate for Discharge 12/09/2020 1609 by Katrina Stack, RN Outcome: Adequate for Discharge Goal: Cardiovascular complication will be avoided 12/09/2020 1609 by Katrina Stack, RN Outcome: Adequate for Discharge 12/09/2020 1609 by Katrina Stack, RN Outcome: Adequate for Discharge   Problem: Activity: Goal: Risk for activity intolerance will decrease 12/09/2020 1609 by Katrina Stack, RN Outcome: Adequate for Discharge 12/09/2020 1609 by Katrina Stack, RN Outcome: Adequate for Discharge   Problem: Nutrition: Goal: Adequate nutrition will be maintained 12/09/2020 1609 by Katrina Stack, RN Outcome: Adequate for Discharge 12/09/2020 1609 by Katrina Stack, RN Outcome: Adequate for Discharge   Problem: Coping: Goal: Level of anxiety will decrease 12/09/2020 1609 by Katrina Stack, RN Outcome:  Adequate for Discharge 12/09/2020 1609 by Katrina Stack, RN Outcome: Adequate for Discharge 12/09/2020 6433 by Katrina Stack, RN Outcome: Progressing   Problem: Elimination: Goal: Will not experience complications related to bowel motility 12/09/2020 1609 by Katrina Stack, RN Outcome: Adequate for Discharge 12/09/2020 1609 by Katrina Stack, RN Outcome: Adequate for Discharge Goal: Will not experience complications related to urinary retention 12/09/2020 1609 by Katrina Stack, RN Outcome: Adequate for Discharge 12/09/2020 1609 by Katrina Stack, RN Outcome: Adequate for Discharge   Problem: Pain Managment: Goal: General experience of comfort will improve 12/09/2020 1609 by Katrina Stack, RN Outcome: Adequate for Discharge 12/09/2020 1609 by Katrina Stack, RN Outcome: Adequate for Discharge   Problem: Safety: Goal: Ability to remain free from injury will improve 12/09/2020 1609 by Katrina Stack, RN Outcome: Adequate for Discharge 12/09/2020 1609 by Katrina Stack, RN Outcome: Adequate for Discharge   Problem: Skin Integrity: Goal: Risk for impaired skin integrity will decrease 12/09/2020 1609 by Katrina Stack, RN Outcome: Adequate for Discharge 12/09/2020 1609 by Katrina Stack, RN Outcome: Adequate for Discharge

## 2020-12-09 NOTE — Plan of Care (Signed)
  Problem: Health Behavior/Discharge Planning: Goal: Ability to manage health-related needs will improve Outcome: Progressing   Problem: Clinical Measurements: Goal: Will remain free from infection Outcome: Progressing   Problem: Coping: Goal: Level of anxiety will decrease Outcome: Progressing   

## 2020-12-09 NOTE — Progress Notes (Signed)
Inpatient Diabetes Program Recommendations  AACE/ADA: New Consensus Statement on Inpatient Glycemic Control (2015)  Target Ranges:  Prepandial:   less than 140 mg/dL      Peak postprandial:   less than 180 mg/dL (1-2 hours)      Critically ill patients:  140 - 180 mg/dL   Results for Cindy Leonard, Cindy Leonard (MRN [id number]) as of 12/09/2020 12:19  Ref. Range 12/08/2020 12:11 12/08/2020 14:33 12/08/2020 17:25 12/08/2020 21:42  Glucose-Capillary Latest Ref Range: 70 - 99 mg/dL 923 (H)  10 units NOVOLOG  320 (H)  15 units NOVOLOG  193 (H)  4 units NOVOLOG  225 (H)    45 units LANTUS   Results for Cindy Leonard, Cindy Leonard (MRN [id number]) as of 12/09/2020 12:19  Ref. Range 12/09/2020 07:33 12/09/2020 11:20  Glucose-Capillary Latest Ref Range: 70 - 99 mg/dL 335 (H)  11 units NOVOLOG @8 :32am  10 units NOVOLOG @9 :18am  246 (H)  7 units NOVOLOG    Admit Neck Abscess  History: DM, Schizophrenia, Bipolar 1 disorder   Home DM Meds: Lantus 45 units QHS        Humalog 15 units TID   Current Orders: Lantus 45 units QHS       Novolog 0-20 units TID    MD- Please consider the flollowing:  1. Increase Lantus to 50 units QHS (AM CBG today was 273)  2. Start Novolog Meal Coverage: Novolog 7 units TID with meals (50% home dose to start) Hold if pt eats <50% of meal, Hold if pt NPO    --Will follow patient during hospitalization--  RN, MSN, CDE Diabetes Coordinator Inpatient Glycemic Control Team Team Pager: [phone] (8a-5p)

## 2020-12-09 NOTE — Discharge Summary (Signed)
Name: Cindy Leonard MRN: [id number] DOB: 08-13-1996 25 y.o. PCP: Patient, No Pcp Per  Date of Admission: 12/07/2020  9:30 PM Date of Discharge: 12/09/20 Attending Physician: Velna Ochs, MD  Discharge Diagnosis: 1. Neck abscess 2. Uncontrolled type 2 diabetes mellitus  Discharge Medications: Allergies as of 12/09/2020      Reactions   Food Swelling   Eyes/throat swelling (blueberries)      Medication List    STOP taking these medications   docusate sodium 100 MG capsule Commonly known as: COLACE   nystatin cream Commonly known as: MYCOSTATIN   zinc sulfate 220 (50 Zn) MG capsule     TAKE these medications   Accu-Chek Aviva Plus test strip Generic drug: glucose blood Use as instructed   Accu-Chek Aviva Plus w/Device Kit Use as directed   accu-chek soft touch lancets Use as instructed   ascorbic acid 250 MG tablet Commonly known as: VITAMIN C Take 1 tablet (250 mg total) by mouth daily.   Cholecalciferol 25 MCG (1000 UT) tablet Take 1,000 Units by mouth daily.   ciprofloxacin 250 MG tablet Commonly known as: Cipro Take 3 tablets (750 mg total) by mouth 2 (two) times daily for 4 days.   clonazePAM 0.25 MG disintegrating tablet Commonly known as: KLONOPIN Take 1 tablet (0.25 mg total) by mouth 2 (two) times daily.   DULoxetine 20 MG capsule Commonly known as: CYMBALTA Take 1 capsule (20 mg total) by mouth daily.   haloperidol 0.5 MG tablet Commonly known as: HALDOL Take 15 tablets (7.5 mg total) by mouth at bedtime. What changed: how much to take   HumaLOG KwikPen 100 UNIT/ML KwikPen Generic drug: insulin lispro Inject 20 Units into the skin 3 (three) times daily. What changed: how much to take   lamoTRIgine 100 MG tablet Commonly known as: LAMICTAL Take 100 mg by mouth 2 (two) times daily.   Lantus SoloStar 100 UNIT/ML Solostar Pen Generic drug: insulin glargine Inject 55 Units into the skin at bedtime. What changed: how much to take    metFORMIN 500 MG tablet Commonly known as: Glucophage Take 2 tablets (1,000 mg total) by mouth 2 (two) times daily with a meal.   oxyCODONE 5 MG immediate release tablet Commonly known as: Oxy IR/ROXICODONE Take 1 tablet (5 mg total) by mouth every 4 (four) hours as needed for moderate pain.   Semaglutide 3 MG Tabs Take 3 mg by mouth daily.   sulfamethoxazole-trimethoprim 800-160 MG tablet Commonly known as: BACTRIM DS Take 1 tablet by mouth 2 (two) times daily for 4 days.   TRAZODONE HCL PO Take 1 tablet by mouth at bedtime.            Discharge Care Instructions  (From admission, onward)         Start     Ordered   12/09/20 0000  Discharge wound care:       Comments: Pt can get in the shower and let the dressing get wet and pull in the shower, or she can pull it out before the shower.  She can let soap and water get into the wound.  After the shower pack the wound with a wet open 4 x 4.  Be sure packing is just inside the wound, not on the outer skin.  Dry dressing over the site.  Do the dressing change twice a Cindy Leonard.   12/09/20 1542          Disposition and follow-up:   Ms.Cindy Leonard was  discharged from [facility] in Stable condition.  At the hospital follow up visit please address:  Neck Abscess s/p I&D 2/6 Surgical cultures significant for moderate gram positive cocci, few gram negative rods. moderate proteus mirabilis  Discharge medication: START ciprofloxacin 710m every 12 hours, START bactrim DS twice daily   Type 2 diabetes: Please check compliance with insulin regimen.  She is to continue taking Metformin and start taking semaglutide.   Labs / imaging needed at time of follow-up:   Pending labs/ test needing follow-up: A1c  Follow-up Appointments:  [place]-up [place]    [facility] [place] [place] [facility]   Specialty: General Surgery Why: Your appointment is at 8:30 AM, be at the office 30 minutes early for check  in.  Bring photo ID and insurance information. Contact information: [phone]               Hospital Course: AMonnie Leonard is a 25y.o. with PMH of T2DM, depression, bipolar and schizophrenia who presented for an evaluation of headache and neck pain and found to have neck abscess needing incision and drainage by general surgery.  Neck Abscess Patient w/ uncontrolled diabetes presented with 1-[place] history of headache and neck pain. Found to have neck abscess on CT C-spine.  She was taken to the OR by surgery for an I&D. Surgical cultures significant for moderate gram positive cocci, few gram negative rods. moderate proteus mirabilis Patient was started on IV vancomycin and transition to p.o. Bactrim and ciprofloxacin at discharge.  IDDM, uncontrolled Patient with uncontrolled T2DM found to have blood sugars in the 300 with BMP showing AG of 16. She endorsed polyuria but no polydipsia.  She was started on her home insulin with sliding scale.  Blood sugars remain high in the 200s.  At discharge her Lantus was increased to 55 units daily at bedtime and and her NovoLog was increased to 20 units 3 times daily with meals.  A1c was pending at discharge.  Patient instructed to follow-up with PCP for adjustment of her insulin.    Depression Bipolar Schizophrenia Stable and chronic.  Home on lamotrigine 100 mg BID, Cymbalta 20 mg daily and Klonopin 0.25 mg will continue during hospitalization.  Discharge Vitals:   BP 120/89   Pulse 70   Temp 99.4 F (37.4 C) (Oral)   Resp 16   Ht _0  (1.702 m)   Wt 130.7 kg   LMP 11/11/2020 (Approximate)   SpO2 98%   Breastfeeding No   BMI 45.12 kg/m   Pertinent Labs, Studies, and Procedures:  CBC Latest Ref Rng & Units 12/09/2020 12/08/2020 07/21/2020  WBC 4.0 - 10.5 K/uL 7.5 8.2 6.2  Hemoglobin 12.0 - 15.0 g/dL 12.0 13.2 13.9  Hematocrit 36.0 - 46.0 % 38.7 42.1 42.7  Platelets 150 - 400 K/uL 264  299 179   BMP Latest Ref Rng & Units 12/09/2020 12/08/2020 07/22/2020  Glucose 70 - 99 mg/dL 278(H) 372(H) 429(H)  BUN 6 - 20 mg/dL _1 Creatinine 0.44 - 1.00 mg/dL 0.74 0.86 0.96  BUN/Creat Ratio 9 - 23 - - -  Sodium 135 - 145 mmol/L 136 133(L) 132(L)  Potassium 3.5 - 5.1 mmol/L 3.5 3.7 3.8  Chloride 98 - 111 mmol/L 101 97(L) 98  CO2 22 - 32 mmol/L 24 20(L) 21(L)  Calcium 8.9 - 10.3 mg/dL 8.6(L) 9.0 8.8(L)    Discharge Instructions: Hello ABannieIt was a pleasure  taking care of you at [place] were admitted for neck abscess and the abscess was drained by surgery.  We are discharging you home on antibiotics and some changes to your insulin.  Please follow-up with your primary care doctor continue to work on weight loss. 1) Increase Lantus to 55 units daily at bedtime 2) Increase NovoLog to 20 units 3 times a [place] with meals. 3) Start semaglutide 30 mg daily 4) Start ciprofloxacin 750 mg every 12 hours 5) Start Bactrim twice daily  Take care,  Dr. Linwood Dibbles, MD, MPH   Signed:  Lacinda Axon, MD 12/09/2020, 4:11 PM Pager: [phone] Internal Medicine Teaching Service

## 2020-12-09 NOTE — Progress Notes (Signed)
DISCHARGE NOTE HOME Cindy Leonard to be discharged Home per MD order. Discussed prescriptions and follow up appointments with the patient. Prescriptions given to patient; medication list explained in detail. Patient verbalized understanding.  Skin clean, dry and intact without evidence of skin break down, no evidence of skin tears noted. IV catheter discontinued intact. Site without signs and symptoms of complications. Dressing and pressure applied. Pt denies pain at the site currently. No complaints noted.  Patient free of lines, drains, and wounds.   An After Visit Summary (AVS) was printed and given to the patient. Patient ambulated to the lobby, and discharged home via private auto.  Katrina Stack, RN

## 2020-12-09 NOTE — Progress Notes (Addendum)
[facility]    CC: neck abscess  Subjective: She did really well with the dressing change.  She is going to get her grandmother to help with the dressing change.    Objective: Vital signs in last 24 hours: Temp:  [97 F (36.1 C)-99.2 F (37.3 C)] 99 F (37.2 C) (02/07 0441) Pulse Rate:  [72-107] 72 (02/07 0441) Resp:  [14-20] 17 (02/07 0441) BP: (106-134)/(58-93) 124/84 (02/07 0441) SpO2:  [94 %-100 %] 98 % (02/07 0441) Weight:  [130.7 kg] 130.7 kg (02/06 1236) Last BM Date: 12/08/20 720 p.o. 850 IV No output recorded T-max 99.3 vital signs are stable. Glucose 278, potassium 3.5, WBC 7.5 Special Requests NECK ABSCESS   Gram Stain NO WBC SEEN  MODERATE GRAM POSITIVE COCCI  FEW GRAM NEGATIVE RODS     Intake/Output from previous Full: 02/06 0701 - 02/07 0700 In: 1570 [P.O.:720; I.V.:200; IV Piggyback:650] Out: 100 [Blood:100] Intake/Output this shift: No intake/output data recorded.  General appearance: alert, cooperative and no distress Skin: see picture below   This is 2 cm deep, no erythema around the site.  She did well with the dressing change.   Lab Results:  Recent Labs    12/08/20 0951 12/09/20 0407  WBC 8.2 7.5  HGB 13.2 12.0  HCT 42.1 38.7  PLT 299 264    BMET Recent Labs    12/08/20 0951 12/09/20 0407  NA 133* 136  K 3.7 3.5  CL 97* 101  CO2 20* 24  GLUCOSE 372* 278*  BUN 7 7  CREATININE 0.86 0.74  CALCIUM 9.0 8.6*   PT/INR No results for input(s): LABPROT, INR in the last 72 hours.  No results for input(s): AST, ALT, ALKPHOS, BILITOT, PROT, ALBUMIN in the last 168 hours.   Lipase     Component Value Date/Time   LIPASE 514 (H) 03/28/2020 1059     Medications: . acetaminophen  1,000 mg Oral Q8H  . clonazePAM  0.25 mg Oral BID  . docusate sodium  100 mg Oral BID  . DULoxetine  20 mg Oral Daily  . enoxaparin (LOVENOX) injection  40 mg Subcutaneous Q24H  . insulin aspart  0-20 Units Subcutaneous TID WC  . insulin aspart  10  Units Subcutaneous Once  . insulin glargine  45 Units Subcutaneous QHS  . lamoTRIgine  100 mg Oral BID   . vancomycin 1,250 mg (12/08/20 2212)   Assessment/Plan Uncontrolled type 2 diabetes Daily marijuana use Hypertension Severe obesity -BMI 42.12 Bipolar disorder Schizophrenia  Posterior neck abscess Incision and drainage and excisional debridement of posterior neck abscess; 4 x 0.5 x 3.5 deep with scalpel, 12/08/2020 Dr. Gaynelle Adu POD #1  FEN: Carb modified ID: Vancomycin 2/6 >> [place] 2 DVT: Lovenox Follow-up: [facility]  Plan:  We pulled the packing out and irrigated site with saline.  We then repacked it with a portion of an open wet 4 x 4.  She can start dressing changes BID.  She can shower and take packing out.  Allow soap and water into the site, then repack after she showers.  We will ask the staff to begin teaching her Grandmother to help her with the dressing changes. Antibiotics per Medicine.  She has follow up with our office and dressing instructions in the AVS.       LOS: 1 [place]    Martavious Hartel 12/09/2020 Please see Amion

## 2020-12-10 ENCOUNTER — Telehealth: Payer: Self-pay | Admitting: *Deleted

## 2020-12-10 DIAGNOSIS — E1165 Type 2 diabetes mellitus with hyperglycemia: Secondary | ICD-10-CM

## 2020-12-10 MED ORDER — LIRAGLUTIDE 18 MG/3ML ~~LOC~~ SOPN
0.6000 mg | PEN_INJECTOR | Freq: Every day | SUBCUTANEOUS | 0 refills | Status: DC
Start: 2020-12-10 — End: 2020-12-30

## 2020-12-10 NOTE — Telephone Encounter (Signed)
Information from [employer] regarding PA for Rybelsus  Not on formulary.  Patient will need to try and fail Byetta, Trulicity, Bydureon or Victoza.  Message to be sent to the Read Team to consider a change.  Angelina Ok, RN 12/10/2020 11:42 AM

## 2020-12-11 ENCOUNTER — Encounter (HOSPITAL_COMMUNITY): Payer: Self-pay | Admitting: Pharmacist Clinician (PhC)/ Clinical Pharmacy Specialist

## 2020-12-11 LAB — HEMOGLOBIN A1C
Hgb A1c MFr Bld: >15.5 % — ABNORMAL HIGH (ref 4.8–5.6)
Mean Plasma Glucose: >398 mg/dL

## 2020-12-12 LAB — AEROBIC/ANAEROBIC CULTURE W GRAM STAIN (SURGICAL/DEEP WOUND): Gram Stain: NONE SEEN

## 2020-12-30 ENCOUNTER — Ambulatory Visit: Payer: Medicaid Other | Attending: Internal Medicine | Admitting: Internal Medicine

## 2020-12-30 ENCOUNTER — Encounter: Payer: Self-pay | Admitting: Internal Medicine

## 2020-12-30 ENCOUNTER — Other Ambulatory Visit: Payer: Self-pay

## 2020-12-30 DIAGNOSIS — Z794 Long term (current) use of insulin: Secondary | ICD-10-CM | POA: Diagnosis not present

## 2020-12-30 DIAGNOSIS — E1165 Type 2 diabetes mellitus with hyperglycemia: Secondary | ICD-10-CM

## 2020-12-30 DIAGNOSIS — E1169 Type 2 diabetes mellitus with other specified complication: Secondary | ICD-10-CM | POA: Diagnosis present

## 2020-12-30 DIAGNOSIS — Z8659 Personal history of other mental and behavioral disorders: Secondary | ICD-10-CM

## 2020-12-30 DIAGNOSIS — Z6841 Body Mass Index (BMI) 40.0 and over, adult: Secondary | ICD-10-CM | POA: Insufficient documentation

## 2020-12-30 DIAGNOSIS — F209 Schizophrenia, unspecified: Secondary | ICD-10-CM | POA: Diagnosis not present

## 2020-12-30 DIAGNOSIS — Z28310 Unvaccinated for covid-19: Secondary | ICD-10-CM | POA: Insufficient documentation

## 2020-12-30 DIAGNOSIS — Z2821 Immunization not carried out because of patient refusal: Secondary | ICD-10-CM | POA: Diagnosis not present

## 2020-12-30 LAB — GLUCOSE, POCT (MANUAL RESULT ENTRY)
POC Glucose: 436 mg/dl — AB (ref 70–99)
POC Glucose: 406 mg/dl — AB (ref 70–99)

## 2020-12-30 LAB — POCT URINALYSIS DIP (CLINITEK)
Bilirubin, UA: NEGATIVE
Blood, UA: NEGATIVE
Glucose, UA: >=1000 mg/dL — AB
Leukocytes, UA: NEGATIVE
Nitrite, UA: NEGATIVE
POC PROTEIN,UA: NEGATIVE
Spec Grav, UA: 1.02 (ref 1.010–1.025)
Urobilinogen, UA: 0.2 E.U./dL
pH, UA: 6 (ref 5.0–8.0)

## 2020-12-30 MED ORDER — INSULIN ASPART 100 UNIT/ML ~~LOC~~ SOLN: 10.0000 [IU] | Freq: Once | SUBCUTANEOUS | Status: DC

## 2020-12-30 NOTE — Patient Instructions (Signed)
I recommend taking the Lantus 55 units daily at bedtime as prescribed. Take Humalog 20 units 3 times a [place] with meals. Always keep a pack of peanut butter crackers or glucose tabs with you to eat in the event you have low blood sugar episodes.  Please check your blood sugars at least twice a [place] before breakfast and before dinner and bring those readings in with you in 2 weeks.

## 2020-12-30 NOTE — Progress Notes (Signed)
Patient ID: Cindy Leonard, female    DOB: June 13, 1996  MRN: [id number]  CC: New Patient (Initial Visit), Diabetes, and Hospitalization Follow-up   Subjective: Cindy Leonard is a 25 y.o. female who presents for hosp f/u and re-est care.  Last saw me in 2019. Her concerns today include:  Patient with history of DM type II, morbid obesity, tob dep, bipolar 1/schizophrenia/depression,  Patient presents to reestablish care with me and is hospital follow-up.   Patient hospitalized 2/5-7/22 with posterior neck abscess.  This was I&D in the OR by surgeon.  Culture grew Enterococcus faecalis, Proteus mirabilis.  She was started on IV vancomycin and transition to oral Bactrim and Cipro at the time of discharge.  She has a follow-up appointment with the surgeon tomorrow.  She has been doing dressing changes to the area twice a [place].  She states that it is no longer draining.  Denies any pain.  During hospitalization, blood sugars were found to be in the 300s and she had an anion gap of 16.  She was restarted on home medications that included Metformin, Lantus insulin 55 units at bedtime and Humalog 20 units with meals.  Victoza on med list but patient states she never got that because the co-pay was too high.  A1c was greater than 15. -Today she reports that her home blood sugar readings have been in the 3-400s.  She checks twice a [place] before meals. -She has been taking Lantus insulin 45 units but has not been taking the Humalog.  Reports some difficulty in using the Humalog pen. -Endorses polyuria.  Denies polydipsia.  No numbness in the hands or feet.  No blurred vision. -Diet: Weakness is [place].  States that she does not eat fast food or bread.  She drinks mainly water. DIABETES TYPE 2 Last A1C:   Lab Results  Component Value Date   HGBA1C >15.5 (H) 12/09/2020    Results for orders placed or performed in visit on 12/30/20  POCT glucose (manual entry)  Result Value Ref Range   POC Glucose 436  (A) 70 - 99 mg/dl  POCT URINALYSIS DIP (CLINITEK)  Result Value Ref Range   Color, UA yellow yellow   Clarity, UA clear clear   Glucose, UA >=1,000 (A) negative mg/dL   Bilirubin, UA negative negative   Ketones, POC UA trace (5) (A) negative mg/dL   Spec Grav, UA 1.020 1.010 - 1.025   Blood, UA negative negative   pH, UA 6.0 5.0 - 8.0   POC PROTEIN,UA negative negative, trace   Urobilinogen, UA 0.2 0.2 or 1.0 E.U./dL   Nitrite, UA Negative Negative   Leukocytes, UA Negative Negative  POCT glucose (manual entry)  Result Value Ref Range   POC Glucose 406 (A) 70 - 99 mg/dl    Mental health: Has history of schizophrenia, bipolar 1 on depression.  States that she was being followed at [facility] but her counselor has left and they were supposed to get her a Company secretary.  However she has not heard back from them in about 2 months.  She does not have a psychiatrist.  She has several psychiatric medications on her list including Cymbalta, Lamictal, Haldol, trazodone and clonazepam.  She tells me that she takes Haldol and the trazodone when she needs it.  Not on the other medications.  When asked who filled this for prescriptions for her, she tells me that she gets it through the emergency room.  Patient Active Problem List  Diagnosis Date Noted  . Influenza vaccine refused 12/30/2020  . COVID-19 vaccine series declined 12/30/2020  . Uncontrolled type 2 diabetes mellitus with hyperglycemia ([place])   . Abscess, neck 12/08/2020  . Urinary tract infection without hematuria   . Suicidal ideation 07/21/2020  . Pneumonia due to COVID-19 virus   . Homicidal ideation   . Bipolar affective disorder, current episode manic ([place]) 04/04/2020  . Diabetes ([place]) 04/04/2020  . DKA (diabetic ketoacidoses) 03/28/2020  . Depression 03/28/2020  . Hyponatremia 03/28/2020  . Elevated blood pressure reading 09/14/2018  . Acne 09/14/2018  . Morbid obesity ([place]) 03/22/2017  . Hx of bipolar disorder 03/22/2017  .  Schizophrenia ([place]) 03/22/2017     Current Outpatient Medications on File Prior to Visit  Medication Sig Dispense Refill  . HUMALOG KWIKPEN 100 UNIT/ML KwikPen Inject 20 Units into the skin 3 (three) times daily. 15 mL 11  . LANTUS SOLOSTAR 100 UNIT/ML Solostar Pen Inject 55 Units into the skin at bedtime. 15 mL 11  . metFORMIN (GLUCOPHAGE) 500 MG tablet Take 2 tablets (1,000 mg total) by mouth 2 (two) times daily with a meal. 120 tablet 0  . ascorbic acid (VITAMIN C) 250 MG tablet Take 1 tablet (250 mg total) by mouth daily. 14 tablet 0  . Blood Glucose Monitoring Suppl (ACCU-CHEK AVIVA PLUS) w/Device KIT Use as directed 1 kit 0  . Cholecalciferol 25 MCG (1000 UT) tablet Take 1,000 Units by mouth daily.    Marland Kitchen glucose blood (ACCU-CHEK AVIVA PLUS) test strip Use as instructed 100 each 12  . haloperidol (HALDOL) 0.5 MG tablet Take 15 tablets (7.5 mg total) by mouth at bedtime. (Patient taking differently: Take 0.5 mg by mouth at bedtime.) 45 tablet 0  . Lancets (ACCU-CHEK SOFT TOUCH) lancets Use as instructed 100 each 12  . TRAZODONE HCL PO Take 1 tablet by mouth at bedtime.     No current facility-administered medications on file prior to visit.    Allergies  Allergen Reactions  . Food Swelling    Eyes/throat swelling (blueberries)    Social History   Socioeconomic History  . Marital status: Single    Spouse name: Not on file  . Number of children: Not on file  . Years of education: Not on file  . Highest education level: Not on file  Occupational History  . Not on file  Tobacco Use  . Smoking status: Current Every [place] Smoker    Types: Cigars  . Smokeless tobacco: Never Used  . Tobacco comment: Used with Marijuana  Vaping Use  . Vaping Use: Never used  Substance and Sexual Activity  . Alcohol use: No  . 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 Sexual activity: Yes    Birth control/protection: None  Other Topics  Concern  . Not on file  Social History Narrative  . Not on file   Social Determinants of Health   Financial Resource Strain: Not on file  Food Insecurity: Not on file  Transportation Needs: Not on file  Physical Activity: Not on file  Stress: Not on file  Social Connections: Not on file  Intimate Partner Violence: Not on file    Family History  Problem Relation Age of Onset  . Stroke Mother   . Diabetes Other   . Hypertension Other   . Asthma Father   . Asthma Brother     Past Surgical History:  Procedure Laterality Date  . INCISION AND DRAINAGE ABSCESS Left 12/08/2020   Procedure: INCISION AND DRAINAGE POSTERIOR NECK ABSCESS;  Surgeon: Greer Pickerel, MD;  Location: [place];  Service: General;  Laterality: Left;  . NO PAST SURGERIES      ROS: Review of Systems Negative except as stated above  PHYSICAL EXAM: BP 126/88   Pulse 82   Resp 16   Ht '5\' 7"'  (1.702 m)   Wt 288 lb 6.4 oz (130.8 kg)   SpO2 97%   BMI 45.17 kg/m   Physical Exam  General appearance - alert, well appearing, and in no distress Mental status - normal mood, behavior, speech, dress, motor activity, and thought processes Neck - supple, no significant adenopathy Chest - clear to auscultation, no wheezes, rales or rhonchi, symmetric air entry Heart - normal rate, regular rhythm, normal S1, S2, no murmurs, rubs, clicks or gallops Extremities - peripheral pulses normal, no pedal edema, no clubbing or cyanosis Diabetic Foot Exam - Simple   Simple Foot Form Visual Inspection No deformities, no ulcerations, no other skin breakdown bilaterally: Yes Sensation Testing Intact to touch and monofilament testing bilaterally: Yes Pulse Check Posterior Tibialis and Dorsalis pulse intact bilaterally: Yes Comments    Skin: She has about a 2 cm incision at the nape of the neck posteriorly.  She has a scab that has formed on it.  No drainage expressed but she is noted to have some dried drainage on the  dressing.  CMP Latest Ref Rng & Units 12/09/2020 12/08/2020 07/22/2020  Glucose 70 - 99 mg/dL 278(H) 372(H) 429(H)  BUN 6 - 20 mg/dL '7 7 12  ' Creatinine 0.44 - 1.00 mg/dL 0.74 0.86 0.96  Sodium 135 - 145 mmol/L 136 133(L) 132(L)  Potassium 3.5 - 5.1 mmol/L 3.5 3.7 3.8  Chloride 98 - 111 mmol/L 101 97(L) 98  CO2 22 - 32 mmol/L 24 20(L) 21(L)  Calcium 8.9 - 10.3 mg/dL 8.6(L) 9.0 8.8(L)  Total Protein 6.5 - 8.1 g/dL - - -  Total Bilirubin 0.3 - 1.2 mg/dL - - -  Alkaline Phos 38 - 126 U/L - - -  AST 15 - 41 U/L - - -  ALT 0 - 44 U/L - - -   Lipid Panel  No results found for: CHOL, TRIG, HDL, CHOLHDL, VLDL, LDLCALC, LDLDIRECT  CBC    Component Value Date/Time   WBC 7.5 12/09/2020 0407   RBC 4.58 12/09/2020 0407   HGB 12.0 12/09/2020 0407   HCT 38.7 12/09/2020 0407   PLT 264 12/09/2020 0407   MCV 84.5 12/09/2020 0407   MCH 26.2 12/09/2020 0407   MCHC 31.0 12/09/2020 0407   RDW 13.9 12/09/2020 0407   LYMPHSABS 2.7 12/08/2020 0951   MONOABS 0.7 12/08/2020 0951   EOSABS 0.2 12/08/2020 0951   BASOSABS 0.0 12/08/2020 0951    ASSESSMENT AND PLAN:  1. Type 2 diabetes mellitus with morbid obesity ([place]) Discussed the importance of healthy eating habits, regular aerobic exercise (at least 150 minutes a week as tolerated) and medication compliance to achieve or maintain control of diabetes and prevent complications. Dietary counseling given.  She is agreeable to seeing the nutritionist. Advised to take the Lantus and NovoLog as prescribed.  Continue Metformin.  Victoza taken off med list.  Clinical pharmacist met with her today to teach her technique and applying insulin shots. -We will check for GAD and IA-2 antibodies to clarify whether she is type I or type II diabetic. Follow-up with clinical pharmacist  in 2 weeks.  Advised to check blood sugars twice a [place] before meals and bring in her readings with her on that visit. - POCT glucose (manual entry) - Microalbumin / creatinine urine  ratio - POCT URINALYSIS DIP (CLINITEK) - Ambulatory referral to Ophthalmology - Amb ref to Medical Nutrition Therapy-MNT - Basic Metabolic Panel - Glutamic acid decarboxylase auto abs - IA-2 Autoantibodies - insulin aspart (novoLOG) injection 10 Units - POCT glucose (manual entry)  2. Influenza vaccine refused recommended.  Patient refused.  3. COVID-19 vaccine series declined Recommended.  Patient refused.  4. Schizophrenia, unspecified type ([place]) - Ambulatory referral to Psychiatry  5. Hx of bipolar disorder - Ambulatory referral to Psychiatry  6.  Neck abscess -Appeared to be healing.  Keep appointment with surgeon tomorrow.  Patient was given the opportunity to ask questions.  Patient verbalized understanding of the plan and was able to repeat key elements of the plan.   Orders Placed This Encounter  Procedures  . Microalbumin / creatinine urine ratio  . Basic Metabolic Panel  . Glutamic acid decarboxylase auto abs  . IA-2 Autoantibodies  . Ambulatory referral to Ophthalmology  . Amb ref to Medical Nutrition Therapy-MNT  . Ambulatory referral to Psychiatry  . POCT glucose (manual entry)  . POCT URINALYSIS DIP (CLINITEK)  . POCT glucose (manual entry)     Requested Prescriptions    No prescriptions requested or ordered in this encounter    Return in about 6 weeks (around 02/10/2021) for Give appt with [facility] in 2 wks for DM recheck.  Karle Plumber, MD, FACP

## 2021-01-06 ENCOUNTER — Telehealth: Payer: Self-pay

## 2021-01-06 LAB — IA-2 AUTOANTIBODIES: IA-2 Autoantibodies: <7.5 U/mL

## 2021-01-06 LAB — MICROALBUMIN / CREATININE URINE RATIO
Creatinine, Urine: 117.4 mg/dL
Microalb/Creat Ratio: 6 mg/g creat (ref 0–29)
Microalbumin, Urine: 7.3 ug/mL

## 2021-01-06 LAB — BASIC METABOLIC PANEL
BUN/Creatinine Ratio: 14 (ref 9–23)
BUN: 12 mg/dL (ref 6–20)
CO2: 21 mmol/L (ref 20–29)
Calcium: 9.5 mg/dL (ref 8.7–10.2)
Chloride: 96 mmol/L (ref 96–106)
Creatinine, Ser: 0.85 mg/dL (ref 0.57–1.00)
Glucose: 382 mg/dL — ABNORMAL HIGH (ref 65–99)
Potassium: 4.3 mmol/L (ref 3.5–5.2)
Sodium: 133 mmol/L — ABNORMAL LOW (ref 134–144)
eGFR: 97 mL/min/{1.73_m2} (ref >59)

## 2021-01-06 LAB — GLUTAMIC ACID DECARBOXYLASE AUTO ABS: Glutamic Acid Decarb Ab: <5 U/mL (ref 0.0–5.0)

## 2021-01-06 NOTE — Telephone Encounter (Signed)
Contacted pt to go over lab results pt didn't answer lvm  

## 2021-01-17 ENCOUNTER — Ambulatory Visit: Payer: Medicaid Other | Admitting: Pharmacist

## 2021-02-24 ENCOUNTER — Emergency Department (HOSPITAL_COMMUNITY)
Admission: EM | Admit: 2021-02-24 | Discharge: 2021-02-24 | Disposition: A | Discharge status: Home / Self Care | Payer: Medicaid Other | Attending: Emergency Medicine | Admitting: Emergency Medicine

## 2021-02-24 ENCOUNTER — Emergency Department (HOSPITAL_COMMUNITY): Payer: Medicaid Other

## 2021-02-24 ENCOUNTER — Ambulatory Visit: Payer: Medicaid Other | Admitting: Internal Medicine

## 2021-02-24 ENCOUNTER — Encounter (HOSPITAL_COMMUNITY): Payer: Self-pay | Admitting: Radiology

## 2021-02-24 DIAGNOSIS — L0291 Cutaneous abscess, unspecified: Secondary | ICD-10-CM

## 2021-02-24 DIAGNOSIS — Z7984 Long term (current) use of oral hypoglycemic drugs: Secondary | ICD-10-CM | POA: Insufficient documentation

## 2021-02-24 DIAGNOSIS — R000 Tachycardia, unspecified: Secondary | ICD-10-CM | POA: Diagnosis not present

## 2021-02-24 DIAGNOSIS — Z8616 Personal history of COVID-19: Secondary | ICD-10-CM | POA: Diagnosis not present

## 2021-02-24 DIAGNOSIS — Z794 Long term (current) use of insulin: Secondary | ICD-10-CM | POA: Diagnosis not present

## 2021-02-24 DIAGNOSIS — E1110 Type 2 diabetes mellitus with ketoacidosis without coma: Secondary | ICD-10-CM | POA: Insufficient documentation

## 2021-02-24 DIAGNOSIS — L0211 Cutaneous abscess of neck: Secondary | ICD-10-CM | POA: Insufficient documentation

## 2021-02-24 DIAGNOSIS — L03221 Cellulitis of neck: Secondary | ICD-10-CM

## 2021-02-24 DIAGNOSIS — F17290 Nicotine dependence, other tobacco product, uncomplicated: Secondary | ICD-10-CM | POA: Insufficient documentation

## 2021-02-24 LAB — CBC WITH DIFFERENTIAL/PLATELET
Abs Immature Granulocytes: 0.05 10*3/uL (ref 0.00–0.07)
Basophils Absolute: 0 10*3/uL (ref 0.0–0.1)
Basophils Relative: 0 %
Eosinophils Absolute: 0.2 10*3/uL (ref 0.0–0.5)
Eosinophils Relative: 2 %
HCT: 45 % (ref 36.0–46.0)
Hemoglobin: 14 g/dL (ref 12.0–15.0)
Immature Granulocytes: 1 %
Lymphocytes Relative: 27 %
Lymphs Abs: 2.8 10*3/uL (ref 0.7–4.0)
MCH: 26.3 pg (ref 26.0–34.0)
MCHC: 31.1 g/dL (ref 30.0–36.0)
MCV: 84.4 fL (ref 80.0–100.0)
Monocytes Absolute: 0.6 10*3/uL (ref 0.1–1.0)
Monocytes Relative: 6 %
Neutro Abs: 6.7 10*3/uL (ref 1.7–7.7)
Neutrophils Relative %: 64 %
Platelets: 315 10*3/uL (ref 150–400)
RBC: 5.33 MIL/uL — ABNORMAL HIGH (ref 3.87–5.11)
RDW: 13.7 % (ref 11.5–15.5)
WBC: 10.2 10*3/uL (ref 4.0–10.5)
nRBC: 0 % (ref 0.0–0.2)

## 2021-02-24 LAB — BASIC METABOLIC PANEL
Anion gap: 12 (ref 5–15)
BUN: 13 mg/dL (ref 6–20)
CO2: 25 mmol/L (ref 22–32)
Calcium: 9.1 mg/dL (ref 8.9–10.3)
Chloride: 97 mmol/L — ABNORMAL LOW (ref 98–111)
Creatinine, Ser: 0.86 mg/dL (ref 0.44–1.00)
GFR, Estimated: >60 mL/min (ref >60)
Glucose, Bld: 286 mg/dL — ABNORMAL HIGH (ref 70–99)
Potassium: 3.8 mmol/L (ref 3.5–5.1)
Sodium: 134 mmol/L — ABNORMAL LOW (ref 135–145)

## 2021-02-24 LAB — I-STAT BETA HCG BLOOD, ED (MC, WL, AP ONLY): I-stat hCG, quantitative: <5 m[IU]/mL (ref <5)

## 2021-02-24 MED ORDER — IOHEXOL 300 MG/ML  SOLN
75.0000 mL | Freq: Once | INTRAMUSCULAR | Status: AC | PRN
  Administered 2021-02-24: 75 mL via INTRAVENOUS

## 2021-02-24 MED ORDER — CLINDAMYCIN HCL 300 MG PO CAPS: 300.0000 mg | ORAL_CAPSULE | Freq: Four times a day (QID) | ORAL | 0 refills | Status: AC

## 2021-02-24 MED ORDER — HALOPERIDOL 5 MG PO TABS: 5.0000 mg | ORAL_TABLET | Freq: Once | ORAL | Status: DC

## 2021-02-24 NOTE — ED Provider Notes (Signed)
[facility] EMERGENCY DEPARTMENT Provider Note   CSN: [id number] Arrival date & time: 02/24/21  1151     History Chief Complaint  Patient presents with  . Cyst    Cindy Leonard is a 25 y.o. female.  HPI   Patient with significant medical history of bipolar, depression, schizophrenia, neck abscess presents with  chief complaint of abscess on her left side of her neck.  Patient endorses that Cindy Leonard noticed this approximately 4 days ago, initially was a  small bump but has gotten progressively larger and more painful.  Patient states that it was draining a little bit yesterday but stopped. Cindy Leonard has no systemic infection like fevers or chills, patientdenies alleviating factors.  Patient states that Cindy Leonard has been taking her insulin as prescribed, denies IV drug use.  patient denies headaches, fevers, chills, shortness of breath, chest pain, abdominal pain, nausea, vomiting, diarrhea, worsening pedal edema.  Past Medical History:  Diagnosis Date  . Bipolar 1 disorder ([place])   . Depression   . Schizophrenia [facility])     Patient Active Problem List   Diagnosis Date Noted  . Influenza vaccine refused 12/30/2020  . COVID-19 vaccine series declined 12/30/2020  . Uncontrolled type 2 diabetes mellitus with hyperglycemia ([place])   . Abscess, neck 12/08/2020  . Urinary tract infection without hematuria   . Suicidal ideation 07/21/2020  . Pneumonia due to COVID-19 virus   . Homicidal ideation   . Bipolar affective disorder, current episode manic ([place]) 04/04/2020  . Diabetes ([place]) 04/04/2020  . DKA (diabetic ketoacidoses) 03/28/2020  . Depression 03/28/2020  . Hyponatremia 03/28/2020  . Elevated blood pressure reading 09/14/2018  . Acne 09/14/2018  . Morbid obesity ([place]) 03/22/2017  . Hx of bipolar disorder 03/22/2017  . Schizophrenia ([place]) 03/22/2017    Past Surgical History:  Procedure Laterality Date  . INCISION AND DRAINAGE ABSCESS Left 12/08/2020   Procedure: INCISION  AND DRAINAGE POSTERIOR NECK ABSCESS;  Surgeon: Greer Pickerel, MD;  Location: [place];  Service: General;  Laterality: Left;  . NO PAST SURGERIES       OB History    Gravida  1   Para      Term      Preterm      AB      Living        SAB      IAB      Ectopic      Multiple      Live Births              Family History  Problem Relation Age of Onset  . Stroke Mother   . Diabetes Other   . Hypertension Other   . Asthma Father   . Asthma Brother     Social History   Tobacco Use  . Smoking status: Current Every [place] Smoker    Types: Cigars  . Smokeless tobacco: Never Used  . Tobacco comment: Used with Marijuana  Vaping Use  . Vaping Use: Never used  Substance Use Topics  . Alcohol use: No  . 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 Authorizing Provider  ascorbic acid (VITAMIN C) 250 MG tablet Take 1 tablet (250 mg total) by mouth daily. 07/26/20   Sharen Hones, MD  Blood Glucose Monitoring Suppl (ACCU-CHEK AVIVA  PLUS) w/Device KIT Use as directed 04/08/20   Clapacs, Madie Reno, MD  Cholecalciferol 25 MCG (1000 UT) tablet Take 1,000 Units by mouth daily. 06/04/20   [provider]  glucose blood (ACCU-CHEK AVIVA PLUS) test strip Use as instructed 04/08/20   Clapacs, Madie Reno, MD  haloperidol (HALDOL) 0.5 MG tablet Take 15 tablets (7.5 mg total) by mouth at bedtime. Patient taking differently: Take 0.5 mg by mouth at bedtime. 07/25/20   Sharen Hones, MD  HUMALOG KWIKPEN 100 UNIT/ML KwikPen Inject 20 Units into the skin 3 (three) times daily. 12/09/20   Lacinda Axon, MD  Lancets (ACCU-CHEK SOFT TOUCH) lancets Use as instructed 04/08/20   Clapacs, Madie Reno, MD  LANTUS SOLOSTAR 100 UNIT/ML Solostar Pen Inject 55 Units into the skin at bedtime. 12/09/20   Lacinda Axon, MD  metFORMIN (GLUCOPHAGE) 500 MG tablet Take 2  tablets (1,000 mg total) by mouth 2 (two) times daily with a meal. 12/09/20 12/09/21  Lacinda Axon, MD  TRAZODONE HCL PO Take 1 tablet by mouth at bedtime.    [provider]    Allergies    Food  Review of Systems   Review of Systems  Constitutional: Negative for chills and fever.  HENT: Negative for congestion.   Respiratory: Negative for shortness of breath.   Cardiovascular: Negative for chest pain.  Gastrointestinal: Negative for abdominal pain.  Genitourinary: Negative for enuresis.  Musculoskeletal: Negative for back pain.  Skin: Negative for rash.       Abscess on left side of neck  Neurological: Negative for dizziness.  Hematological: Does not bruise/bleed easily.    Physical Exam Updated Vital Signs BP (!) 117/95 (BP Location: Left Arm)   Pulse 99   Temp 99.2 F (37.3 C) (Oral)   Resp 16   SpO2 100%   Physical Exam Vitals and nursing note reviewed.  Constitutional:      General: Cindy Leonard is not in acute distress.    Appearance: Cindy Leonard is not ill-appearing.  HENT:     Head: Normocephalic and atraumatic.     Nose: No congestion.  Eyes:     Conjunctiva/sclera: Conjunctivae normal.  Neck:     Comments: Patient is neck was visualized Cindy Leonard has a large nodule on the left aspect of her neck, no noted erythema, no active discharge or drainage present, area was warm to the touch, with induration,  no fluctuance.  Induration measuring approximately 4 cm in diameter Cardiovascular:     Rate and Rhythm: Normal rate and regular rhythm.     Pulses: Normal pulses.     Heart sounds: No murmur heard. No friction rub. No gallop.   Pulmonary:     Effort: No respiratory distress.     Breath sounds: No wheezing, rhonchi or rales.  Abdominal:     Palpations: Abdomen is soft.     Tenderness: There is no abdominal tenderness.  Musculoskeletal:     Cervical back: Tenderness present. No rigidity.  Lymphadenopathy:     Cervical: No cervical adenopathy.  Skin:    General:  Skin is warm and dry.  Neurological:     Mental Status: Cindy Leonard is alert.  Psychiatric:        Mood and Affect: Mood normal.     ED Results / Procedures / Treatments   Labs (all labs ordered are listed, but only abnormal results are displayed) Labs Reviewed  CBC WITH DIFFERENTIAL/PLATELET - Abnormal; Notable for the following components:      Result  Value   RBC 5.33 (*)    All other components within normal limits  BASIC METABOLIC PANEL - Abnormal; Notable for the following components:   Sodium 134 (*)    Chloride 97 (*)    Glucose, Bld 286 (*)    All other components within normal limits  I-STAT BETA HCG BLOOD, ED ([facility], [facility], [facility] ONLY)    EKG None  Radiology No results found.  Procedures Procedures   Medications Ordered in ED Medications  haloperidol (HALDOL) tablet 5 mg (has no administration in time range)  iohexol (OMNIPAQUE) 300 MG/ML solution 75 mL (75 mLs Intravenous Contrast Given 02/24/21 1602)    ED Course  I have reviewed the triage vital signs and the nursing notes.  Pertinent labs & imaging results that were available during my care of the patient were reviewed by me and considered in my medical decision making (see chart for details).  Clinical Course as of 02/24/21 1610  Mon Feb 24, 2021  1518 Handoff: History of abscess here with similar. On neck today. Concern for deep tissue infection pending CT neck. Follow up CT neck.  [CC]    Clinical Course User Index [CC] Tretha Sciara, MD   MDM Rules/Calculators/A&P                          Initial impression-patient presents with abscess on her left side of her neck.  Cindy Leonard is alert, does not appear to be in acute chest, vital signs reassuring.  Patient is borderline tachycardic, febrile, patient's had history of a abscess of the neck which needed IV antibiotics, will obtain basic lab work-up, CT with contrast of neck for further evaluation.  Work-up-CBC negative for leukocytosis, BMP shows hyponatremia 134,  hyperglycemia of 286.  i-STAT hCG less than 5.   Rule out-low suspicion for systemic infection as patient nontoxic-appearing, vital signs reassuring.  Low suspicion for airway compromise as lung sounds are clear bilaterally, no wheezing or rhonchi present, patient denies any difficulty breathing swallowing.    Due to shift change patient will be handed to chase countryman MD  he was provided HPI, current work-up, likely disposition.  If CT neck does not reveal drinkable  abscess patient can  be discharged home on outpatient antibiotics, or if large abscess needing surgical drainage consult with general surgery for further recommendations.   Final Clinical Impression(s) / ED Diagnoses Final diagnoses:  Abscess    Rx / DC Orders ED Discharge Orders    None       Aron Baba 02/24/21 1610    Davonna Belling, MD 02/25/21 [phone]

## 2021-02-24 NOTE — ED Notes (Signed)
Patient transported to CT 

## 2021-02-24 NOTE — Discharge Instructions (Addendum)
You were seen today for neck swelling.  Your labs, CT findings, physical exam are most consistent with cellulitis at this time.  We will recommend outpatient antibiotics with clindamycin and plan to follow-up with your primary care provider to ensure continued improvement.  If your symptoms are worsening, please return as this can develop into an abscess with complete antibiotic use, you should continue to improve.  Please finish this antibiotic even if your symptoms have resolved. Thank you for the opportunity to participate in your care.  Return with any change in your symptoms including fevers or chills, nausea or vomiting, syncope or shortness of breath, difficulty with breathing.

## 2021-02-24 NOTE — ED Notes (Signed)
Patient Alert and oriented to baseline. Stable and ambulatory to baseline. Patient verbalized understanding of the discharge instructions.  Patient belongings were taken by the patient.   

## 2021-02-24 NOTE — ED Triage Notes (Signed)
Pt has cyst on left side of her neck. Pt states she had one similar about 3 months ago that she had drained.

## 2021-02-24 NOTE — ED Triage Notes (Signed)
Emergency Medicine Provider Triage Evaluation Note  Cindy Leonard , a 25 y.o. female  was evaluated in triage.  Pt complains of left sided neck abscess. History of same on the right. Notes area enlarged over night. No fever or chills. Required I&D last time. No difficulties breathing or swallowing.  Review of Systems  Positive: Color change Negative: fever  Physical Exam  BP (!) 117/95 (BP Location: Left Arm)   Pulse 99   Temp 99.2 F (37.3 C) (Oral)   Resp 16   SpO2 100%  Gen:   Awake, no distress   HEENT:  Large area of induration with central fluctuance to left side of neck Resp:  Normal effort  Cardiac:  Normal rate  Abd:   Nondistended, nontender  MSK:   Moves extremities without difficulty  Neuro:  Speech clear   Medical Decision Making  Medically screening exam initiated at 12:31 PM.  Appropriate orders placed.  Cindy Leonard was informed that the remainder of the evaluation will be completed by another provider, this initial triage assessment does not replace that evaluation, and the importance of remaining in the ED until their evaluation is complete.  Clinical Impression  Left neck abscess. History of same. Will most likely require I&D given size. No respiratory distress. Airway patent.    Mannie Stabile, [place] 02/24/21 1233

## 2021-02-24 NOTE — ED Provider Notes (Signed)
  Physical Exam  BP 104/74 (BP Location: Right Arm)   Pulse 77   Temp 98.7 F (37.1 C) (Oral)   Resp 16   SpO2 98%   Physical Exam  ED Course/Procedures   Clinical Course as of 02/24/21 2148  Mon Feb 24, 2021  1518 Handoff: History of abscess here with similar. On neck today. Concern for deep tissue infection pending CT neck. Follow up CT neck.  [CC]    Clinical Course User Index [CC] Glyn Ade, MD    Procedures  MDM  Patient received from previous provider at 1600, please see their note for complete H&P.  In summary, patient with recurrent neck swelling here with left-sided neck discomfort.  Patient with no airway or esophageal concerns.  Breathing and swallowing without difficulty.  However given her progression in the past, she was concerned there might be another abscess. Patient pending CT scan at time of handoff.  CT scan completed and no deep space neck infection seen on CT.  Patient continuing to be relatively asymptomatic.  A little bit of erythema over the lateral neck.  We will proceed with antibiotics (clindamycin) in the outpatient setting with strict return precautions regarding worsening or symptoms.  Patient encouraged to follow-up with her previous primary care doctor or general surgeon who performed her last I&D for reassessment if needed.  Patient expressed understanding.      Glyn Ade, MD 02/24/21 3220    Blane Ohara, MD 02/26/21 0005

## 2021-03-24 ENCOUNTER — Telehealth: Payer: Self-pay

## 2021-03-24 NOTE — Telephone Encounter (Signed)
Copied from CRM [id number]. Topic: General - Other >> Mar 21, 2021  3:36 PM Cindy Leonard wrote: Reason for CRM: Patient would like to know the status of her request to see a psychiatrist and dietician.  She stated it has been about a month since she spoke to the doctor about it and she has not heard anything yet.  Please advise and call to update at [phone]

## 2021-03-25 NOTE — Telephone Encounter (Signed)
Contacted pt and left a detailed vm making pt aware that the psychiatrist and nutritionist has tried to contact her to schedule and she has not returned any calls back to them to schedule.   Provided pt with both numbers to call and schedule and if she has any questions or concerns to give [place] a call   ALAM REG [facility]  [phone]  [facility]  [phone]

## 2021-04-10 ENCOUNTER — Telehealth: Payer: Self-pay | Admitting: Internal Medicine

## 2021-04-10 MED ORDER — HUMALOG KWIKPEN 100 UNIT/ML ~~LOC~~ SOPN: 20.0000 [IU] | PEN_INJECTOR | Freq: Three times a day (TID) | SUBCUTANEOUS | 0 refills | Status: DC

## 2021-04-10 NOTE — Telephone Encounter (Signed)
Rx sent 

## 2021-04-10 NOTE — Telephone Encounter (Signed)
Called pt left VM RX was sent to pharmacy.  °

## 2021-04-10 NOTE — Telephone Encounter (Signed)
Medication Refill - Medication:   insulin aspart (novoLOG) injection 10 Units  Has the patient contacted their pharmacy? Yes.  Pt stated she is completely out of insulin.    Preferred Pharmacy (with phone number or street name):   [id number] - [place] ([place]), [place] - [address]  [address]  ([place]) [place] [address]  Phone: [phone] Fax: [phone]    Agent: Please be advised that RX refills may take up to 3 business days. We ask that you follow-up with your pharmacy.

## 2021-04-10 NOTE — Addendum Note (Signed)
Addended by: Lois Huxley, Jeannett Senior L on: 04/10/2021 03:05 PM   Modules accepted: Orders

## 2021-04-24 ENCOUNTER — Other Ambulatory Visit: Payer: Self-pay

## 2021-04-24 ENCOUNTER — Encounter: Payer: Self-pay | Admitting: Internal Medicine

## 2021-04-24 ENCOUNTER — Ambulatory Visit: Payer: Medicaid Other | Attending: Internal Medicine | Admitting: Internal Medicine

## 2021-04-24 ENCOUNTER — Ambulatory Visit (HOSPITAL_BASED_OUTPATIENT_CLINIC_OR_DEPARTMENT_OTHER): Payer: Medicaid Other | Admitting: Pharmacist

## 2021-04-24 DIAGNOSIS — Z8659 Personal history of other mental and behavioral disorders: Secondary | ICD-10-CM

## 2021-04-24 DIAGNOSIS — F209 Schizophrenia, unspecified: Secondary | ICD-10-CM

## 2021-04-24 DIAGNOSIS — E1169 Type 2 diabetes mellitus with other specified complication: Secondary | ICD-10-CM

## 2021-04-24 DIAGNOSIS — R030 Elevated blood-pressure reading, without diagnosis of hypertension: Secondary | ICD-10-CM

## 2021-04-24 DIAGNOSIS — Z79899 Other long term (current) drug therapy: Secondary | ICD-10-CM

## 2021-04-24 LAB — POCT GLYCOSYLATED HEMOGLOBIN (HGB A1C): HbA1c, POC (controlled diabetic range): 12.8 % — AB (ref 0.0–7.0)

## 2021-04-24 LAB — GLUCOSE, POCT (MANUAL RESULT ENTRY): POC Glucose: 131 mg/dl — AB (ref 70–99)

## 2021-04-24 MED ORDER — INSULIN PEN NEEDLE 31G X 8 MM MISC
6 refills | Status: DC
  Filled 2021-04-24: qty 100, 25d supply, fill #0
  Filled 2021-10-30: qty 100, 25d supply, fill #1

## 2021-04-24 MED ORDER — METFORMIN HCL 500 MG PO TABS
1000.0000 mg | ORAL_TABLET | Freq: Two times a day (BID) | ORAL | 6 refills | Status: DC
  Filled 2021-04-24: qty 120, 30d supply, fill #0

## 2021-04-24 MED ORDER — LANTUS SOLOSTAR 100 UNIT/ML ~~LOC~~ SOPN
60.0000 [IU] | PEN_INJECTOR | Freq: Every day | SUBCUTANEOUS | 11 refills | Status: DC
  Filled 2021-04-24: qty 15, 25d supply, fill #0
  Filled 2021-10-20 – 2021-10-30 (×2): qty 15, 25d supply, fill #1
  Filled 2021-12-10: qty 15, 25d supply, fill #0
  Filled 2022-01-13: qty 15, 25d supply, fill #1

## 2021-04-24 MED ORDER — TRULICITY 0.75 MG/0.5ML ~~LOC~~ SOAJ
0.7500 mg | SUBCUTANEOUS | 6 refills | Status: DC
  Filled 2021-04-24: qty 2, 28d supply, fill #0

## 2021-04-24 NOTE — Progress Notes (Signed)
Patient ID: Cindy Leonard, female    DOB: 01-Mar-1996  MRN: [id number]  CC: Diabetes   Subjective: Cindy Leonard is a 25 y.o. female who presents for chronic ds management.  Last seen 12/2020 Her concerns today include:  Patient with history of DM type II, morbid obesity,  bipolar 1/schizophrenia/depression,  DIABETES TYPE 2/Obesity Last A1C:   Results for orders placed or performed in visit on 04/24/21  POCT glucose (manual entry)  Result Value Ref Range   POC Glucose 131 (A) 70 - 99 mg/dl  POCT glycosylated hemoglobin (Hb A1C)  Result Value Ref Range   Hemoglobin A1C     HbA1c POC (<> result, manual entry)     HbA1c, POC (prediabetic range)     HbA1c, POC (controlled diabetic range) 12.8 (A) 0.0 - 7.0 %    Med Adherence:  '[x]'  Yes  -Metformin, Lantus 55, Humalog - reports she was doing a SSI instead of 20 units with meals as prescribed.  On average she was taking 8-10 units with meals. Medication side effects:  '[x]'  Yes -diarreha sometimes with Metformin but not often Home Monitoring?  '[x]'  Yes  4 x a Goncalves before meals and bedtime.  Did not bring log Home glucose results range: gives range 160-203 before meals; 200s at bedtime Diet Adherence: Doing better - cut back on white carbs, less pork, more bake meats.  Drinks mainly water.  Exercise: '[x]'  Yes  -walking QOD for 1 hr provided not too hot outside Hypoglycemic episodes?: '[]'  Yes    '[x]'  No Numbness of the feet? '[]'  Yes    '[x]'  No Retinopathy hx? '[]'  Yes    '[]'  No Last eye exam:  referred for eye exam on last visit.  States she was not called. Comments: Refer to nutritionist on last visit.  Patient states she was never called.  However they did call and leave messages.  My medical assistant also called her and left a message on her voicemail with the phone numbers for both the nutritionist and behavioral health so that she can call them back to schedule as they were attempting to reach her unsuccessfully.  I asked her about cigarette  smoking as it was on her record.  Patient states that she was never a smoker.  I made this change to her record today.    History of bipolar/schizophrenia: Referred to behavioral health on last visit.  They did try to reach her unsuccessfully.  Patient would still like to be seen.  Patient Active Problem List   Diagnosis Date Noted   Influenza vaccine refused 12/30/2020   COVID-19 vaccine series declined 12/30/2020   Uncontrolled type 2 diabetes mellitus with hyperglycemia ([place])    Abscess, neck 12/08/2020   Urinary tract infection without hematuria    Suicidal ideation 07/21/2020   Pneumonia due to COVID-19 virus    Homicidal ideation    Bipolar affective disorder, current episode manic ([place]) 04/04/2020   Diabetes ([place]) 04/04/2020   DKA (diabetic ketoacidoses) 03/28/2020   Depression 03/28/2020   Hyponatremia 03/28/2020   Elevated blood pressure reading 09/14/2018   Acne 09/14/2018   Morbid obesity ([place]) 03/22/2017   Hx of bipolar disorder 03/22/2017   Schizophrenia ([place]) 03/22/2017     Current Outpatient Medications on File Prior to Visit  Medication Sig Dispense Refill   ascorbic acid (VITAMIN C) 250 MG tablet Take 1 tablet (250 mg total) by mouth daily. 14 tablet 0   Blood Glucose Monitoring Suppl (ACCU-CHEK AVIVA PLUS) w/Device  KIT Use as directed 1 kit 0   Cholecalciferol 25 MCG (1000 UT) tablet Take 1,000 Units by mouth daily.     glucose blood (ACCU-CHEK AVIVA PLUS) test strip Use as instructed 100 each 12   haloperidol (HALDOL) 0.5 MG tablet Take 15 tablets (7.5 mg total) by mouth at bedtime. (Patient taking differently: Take 0.5 mg by mouth at bedtime.) 45 tablet 0   HUMALOG KWIKPEN 100 UNIT/ML KwikPen Inject 20 Units into the skin 3 (three) times daily. 15 mL 0   Lancets (ACCU-CHEK SOFT TOUCH) lancets Use as instructed 100 each 12   TRAZODONE HCL PO Take 1 tablet by mouth at bedtime.     No current facility-administered medications on file prior to visit.     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   Sexual activity: Yes    Birth control/protection: None  Other Topics Concern   Not on file  Social History Narrative   Not on file   Social Determinants of Health   Financial Resource Strain: Not on file  Food Insecurity: Not on file  Transportation Needs: Not on file  Physical Activity: Not on file  Stress: Not on file  Social Connections: Not on file  Intimate Partner Violence: Not on file    Family History  Problem Relation Age of Onset   Stroke Mother    Diabetes Other    Hypertension Other    Asthma Father    Asthma Brother     Past Surgical History:  Procedure Laterality Date   INCISION AND DRAINAGE ABSCESS Left 12/08/2020   Procedure: INCISION AND DRAINAGE POSTERIOR NECK ABSCESS;  Surgeon: Greer Pickerel, MD;  Location: [place];  Service: General;  Laterality: Left;   NO PAST SURGERIES      ROS: Review of Systems Negative except as stated above  PHYSICAL EXAM: BP 110/84   Pulse 66   Resp 16   Wt 294 lb 6.4 oz (133.5 kg)   SpO2 97%   BMI 46.11 kg/m   Wt Readings from Last 3 Encounters:  04/24/21 294 lb 6.4 oz (133.5 kg)  12/30/20 288 lb 6.4 oz (130.8 kg)  12/08/20 288 lb 1.6 oz (130.7 kg)    Physical Exam  General appearance - alert, well appearing, morbidly obese young African-American female and in no distress Mental status -  normal mood, behavior, speech, dress, motor activity, and thought processes Neck - supple, no significant adenopathy Chest - clear to auscultation, no wheezes, rales or rhonchi, symmetric air entry Heart - normal rate, regular rhythm, normal S1, S2, no murmurs, rubs, clicks or gallops Extremities - peripheral pulses normal, no pedal edema, no clubbing or cyanosis   CMP Latest Ref Rng & Units 02/24/2021 12/30/2020 12/09/2020  Glucose 70 - 99 mg/dL  286(H) 382(H) 278(H)  BUN 6 - 20 mg/dL '13 12 7  ' Creatinine 0.44 - 1.00 mg/dL 0.86 0.85 0.74  Sodium 135 - 145 mmol/L 134(L) 133(L) 136  Potassium 3.5 - 5.1 mmol/L 3.8 4.3 3.5  Chloride 98 - 111 mmol/L 97(L) 96 101  CO2 22 - 32 mmol/L '25 21 24  ' Calcium 8.9 - 10.3 mg/dL 9.1 9.5 8.6(L)  Total Protein 6.5 - 8.1 g/dL - - -  Total Bilirubin 0.3 - 1.2 mg/dL - - -  Alkaline Phos 38 - 126 U/L - - -  AST 15 - 41 U/L - - -  ALT 0 - 44 U/L - - -   Lipid Panel  No results found for: CHOL, TRIG, HDL, CHOLHDL, VLDL, LDLCALC, LDLDIRECT  CBC    Component Value Date/Time   WBC 10.2 02/24/2021 1352   RBC 5.33 (H) 02/24/2021 1352   HGB 14.0 02/24/2021 1352   HCT 45.0 02/24/2021 1352   PLT 315 02/24/2021 1352   MCV 84.4 02/24/2021 1352   MCH 26.3 02/24/2021 1352   MCHC 31.1 02/24/2021 1352   RDW 13.7 02/24/2021 1352   LYMPHSABS 2.8 02/24/2021 1352   MONOABS 0.6 02/24/2021 1352   EOSABS 0.2 02/24/2021 1352   BASOSABS 0.0 02/24/2021 1352    ASSESSMENT AND PLAN: 1. Type 2 diabetes mellitus with morbid obesity (HCC) A1c has improved but she still has a long way to go. Dietary counseling given.  Phone number given for lifestyle center, diabetes and nutrition counseling so that she can call and schedule the appointment.  Encouraged her to continue regular exercise. She now has insurance.  We discussed adding Trulicity.  I went over the benefits of Trulicity and that it will help to lower blood sugars but also help with weight loss.  Advised that if she  develops any vomiting or epigastric abdominal pain, she should stop the medication and let me know as this can be signs of pancreatitis which is a possible side effect of the medication. -Continue metformin. Increase Lantus to 60 units daily. -Stop Humalog Clinical pharmacist to do Trulicity administered teaching today.  I will resubmit referral for eye exam. Continue to check blood sugars at least 3 times a [place] before meals.  Follow-up with clinical pharmacist in about 1 month with her readings. - POCT glucose (manual entry) - POCT glycosylated hemoglobin (Hb A1C) - Ambulatory referral to Ophthalmology - Dulaglutide (TRULICITY) 7.56 EP/3.2RJ SOPN; Inject 0.75 mg into the skin once a week.  Dispense: 2 mL; Refill: 6 - metFORMIN (GLUCOPHAGE) 500 MG tablet; Take 2 tablets (1,000 mg total) by mouth 2 (two) times daily with a meal.  Dispense: 120 tablet; Refill: 6 - LANTUS SOLOSTAR 100 UNIT/ML Solostar Pen; Inject 60 Units into the skin at bedtime.  Dispense: 15 mL; Refill: 11 - Insulin Pen Needle 31G X 8 MM MISC; Use as directed  Dispense: 100 each; Refill: 6  2. Hx of bipolar disorder 3. Schizophrenia, unspecified type [facility]) Patient provided with phone number for [facility] so that she can give them a call to schedule the appointment  4. Elevated blood pressure reading DASH diet discussed and encouraged.  We will recheck blood pressure on subsequent visit.     Patient was given the opportunity to ask questions.  Patient verbalized understanding of the plan and was able to repeat key elements of the plan.   Orders Placed This Encounter  Procedures   Ambulatory referral to Ophthalmology   POCT glucose (manual entry)  POCT glycosylated hemoglobin (Hb A1C)     Requested Prescriptions   Signed Prescriptions Disp Refills   Dulaglutide (TRULICITY) 3.57 IX/7.8ER SOPN 2 mL 6    Sig: Inject 0.75 mg into the skin once a week.   metFORMIN (GLUCOPHAGE) 500 MG  tablet 120 tablet 6    Sig: Take 2 tablets (1,000 mg total) by mouth 2 (two) times daily with a meal.   LANTUS SOLOSTAR 100 UNIT/ML Solostar Pen 15 mL 11    Sig: Inject 60 Units into the skin at bedtime.   Insulin Pen Needle 31G X 8 MM MISC 100 each 6    Sig: Use as directed    Return in about 4 months (around 08/24/2021) for gIVE APPT with [facility] in 3 wks for DM recheck.  Karle Plumber, MD, FACP

## 2021-04-24 NOTE — Patient Instructions (Addendum)
Start Trulicity once weekly injection to help better control your diabetes.  Once you have started the Trulicity, you should stop the Humalog insulin.  This is for short acting insulin that you were taking before meals.  Continue Lantus insulin but increase to 60 units daily.  I have submitted a referral again for the eye appointment.  I have included the names and phone numbers for you to call to schedule psychiatry appointment and appointment with the nutritionist.  They have been trying to reach you to schedule the appointment.  [facility] [phone]   [facility]  [phone]

## 2021-04-24 NOTE — Progress Notes (Signed)
Patient was educated on the use of the Trulicity pen. Reviewed necessary supplies and operation of the pen. Also reviewed goal blood glucose levels. Patient was able to demonstrate use. All questions and concerns were addressed.  Cindy Leonard, PharmD, BCACP, CPP Clinical Pharmacist [facility] [phone]  

## 2021-05-12 ENCOUNTER — Ambulatory Visit: Payer: Medicaid Other | Admitting: Pharmacist

## 2021-06-11 ENCOUNTER — Encounter: Payer: Medicaid Other | Attending: Internal Medicine | Admitting: Registered"

## 2021-06-30 ENCOUNTER — Ambulatory Visit: Payer: Self-pay | Admitting: *Deleted

## 2021-06-30 DIAGNOSIS — Z3201 Encounter for pregnancy test, result positive: Secondary | ICD-10-CM

## 2021-06-30 NOTE — Telephone Encounter (Signed)
Contacted pt to schedule an appt pt didn't answer lvm  ?

## 2021-06-30 NOTE — Telephone Encounter (Signed)
Patient is calling to report she has had positive UPT at home. Patient can not tell how far into her pregnancy she is- because she states she does not have regular cycles. Patient is concerned about her medication and wants to know if she should continue then- or stop them. Patient advised she may need to adjust some of her medications- or change some of them due to their safety during pregnancy. Patient states she does not have OB that she sees and needs a referral. Discussed what is considered a high risk pregnancy and she may fall into that category due to her medical history- patient was agitated on the phone and did not like the fact that I could not advised her on all her medications. Advised patient I would send note for review and the office would call her back.  Reason for Disposition  [1] Caller has URGENT pregnancy question AND [2] triager unable to answer question  Answer Assessment - Initial Assessment Questions 1. PREGNANCY: "Do you know how many weeks or months pregnant you are?"      Not sure- irregular cycle 2. EDD: "What date are you expecting to deliver?"     unsure 3. BLOOD PRESSURE: "Do you have high blood pressure before or during this pregnancy?" (Note to Triager: High blood pressure may elevate acuity with minor symptoms)     Patient is on BP medication 4. PLACENTA LOCATION: "Is your placenta abnormally placed?" (Note to Triager: Placenta previa patients have higher risk for severe bleeding and should be sent in now for any bleeding)     unknown 5. MAIN SYMPTOM: "What are you most concerned about?" When did it start?"     Medications that are safe 6. VAGINAL BLEEDING: If present, ask: "How bad is it?" (Mild, Moderate or Severe. See Severity definitions).     No bleeding 7. ABDOMINAL PAIN: If present, ask: "How bad is it?" (Mild, Moderate or Severe. See Severity definitions).     No pain 8. VOMITING: If present, ask: "How bad is it?" (Mild, Moderate or Severe. See Severity  definitions).     Yes- keeping water down- vomiting frequently 9. LEG SWELLING (EDEMA): If present, ask: "How bad is it?" (Mild, Moderate or Severe. See Severity definitions).     Yes- top of legs- no swelling in ankles 10. FEVER: "Do you have a fever?" If so, ask: "What is it, how was it measured, and when did it start?"        No fever 11. OTHER SYMPTOMS: "Do you have any other symptoms?" (e.g., vision changes, chest pain, trouble breathing)       no  Protocols used: Pregnancy Questions-P-AH

## 2021-06-30 NOTE — Telephone Encounter (Signed)
Message from [place] sent at 06/30/2021  2:12 PM EDT  Summary: insulin concerns   The patient has taken a pregnancy test that confirmed they are expecting   The patient uses insulin and has concerns related to insulin use while pregnant   Patient last used insulin 06/29/21 around 10:00 PM   Please contact when possible           Call History   Type Contact Phone/Fax User  06/30/2021 02:10 PM EDT Phone (Incoming) Neria, Yalexa Blust (Self) [phone] Rexene Edison) Coley, Everette A

## 2021-06-30 NOTE — Telephone Encounter (Signed)
Will forward to provider  

## 2021-07-01 ENCOUNTER — Other Ambulatory Visit: Payer: Self-pay

## 2021-07-01 MED ORDER — INSULIN LISPRO (1 UNIT DIAL) 100 UNIT/ML (KWIKPEN)
8.0000 [IU] | PEN_INJECTOR | Freq: Three times a day (TID) | SUBCUTANEOUS | 1 refills | Status: DC
  Filled 2021-07-01: qty 6, 25d supply, fill #0

## 2021-07-01 NOTE — Telephone Encounter (Signed)
Contacted pt to go over provider response pt didn't answer left a detailed vm and if she has any questions or concerns to give us a call  

## 2021-07-08 ENCOUNTER — Other Ambulatory Visit: Payer: Self-pay

## 2021-08-22 ENCOUNTER — Other Ambulatory Visit: Payer: Self-pay | Admitting: Internal Medicine

## 2021-08-22 ENCOUNTER — Telehealth: Payer: Self-pay | Admitting: Internal Medicine

## 2021-08-22 DIAGNOSIS — E1169 Type 2 diabetes mellitus with other specified complication: Secondary | ICD-10-CM

## 2021-08-22 NOTE — Telephone Encounter (Signed)
Medication: ACCU-CHEK AVIVA PLUS Meter - Pt states that her meter is broken  Has the patient contacted their pharmacy? YES Advised to contact the office (Agent: If no, request that the patient contact the pharmacy for the refill. If patient does not wish to contact the pharmacy document the reason why and proceed with request.) (Agent: If yes, when and what did the pharmacy advise?)  Preferred Pharmacy (with phone number or street name): [facility] [address] [place] [address] Phone: [phone] Fax: [phone] Hours: M-F 8:30a-5:30p   Has the patient been seen for an appointment in the last year OR does the patient have an upcoming appointment? YES 04/24/21 and 09/09/21 CPE  Agent: Please be advised that RX refills may take up to 3 business days. We ask that you follow-up with your pharmacy.

## 2021-08-22 NOTE — Telephone Encounter (Signed)
Pt is calling to ask Dr. Laural Benes did she think a referral to [facility] would be a good option for her discuss her weight loss.  Pt has been to the [facility]. Pt did not feel a connect with the dietian. Pt reports that she was told a lot of food that she could not eat. But not really advised what she could eat.   Please advise   Cb- [phone]

## 2021-08-23 NOTE — Telephone Encounter (Signed)
Requested medications are due for refill today unsure if get more than one kit  Requested medications are on the active medication list yes  Last refill 04/08/20  Last visit 04/24/21  Future visit scheduled 09/09/21  Notes to clinic please assess.  Requested Prescriptions  Pending Prescriptions Disp Refills   Blood Glucose Monitoring Suppl (ACCU-CHEK AVIVA PLUS) w/Device KIT 1 kit 0    Sig: Use as directed     Endocrinology: Diabetes - Testing Supplies Passed - 08/22/2021  5:52 PM      Passed - Valid encounter within last 12 months    Recent Outpatient Visits           4 months ago Encounter for medication review   Purcell, Annie Main L, RPH-CPP   4 months ago Type 2 diabetes mellitus with morbid obesity ([place])   [facility] Karle Plumber B, MD   7 months ago Type 2 diabetes mellitus with morbid obesity [facility])   Raymond, Deborah B, MD   2 years ago Diabetes mellitus type 2, uncontrolled, without complications [facility])   Julian, Deborah B, MD   3 years ago Morbid obesity [facility])   [place], MD       Future Appointments             In 2 weeks Ladell Pier, MD [address]   In 1 month Wynetta Emery, Dalbert Batman, MD [place]

## 2021-08-25 ENCOUNTER — Ambulatory Visit: Payer: Medicaid Other | Admitting: Internal Medicine

## 2021-08-25 ENCOUNTER — Telehealth: Payer: Self-pay | Admitting: *Deleted

## 2021-08-25 DIAGNOSIS — E1169 Type 2 diabetes mellitus with other specified complication: Secondary | ICD-10-CM

## 2021-08-25 NOTE — Telephone Encounter (Signed)
Attempt to return patient call.  LMOVM however, her VM greeting states she will not call back.   Left message asking patient to return call to schedule virtual appt. At 0810 in the a.m.

## 2021-08-25 NOTE — Telephone Encounter (Signed)
Copied from CRM [id number]. Topic: Referral - Request for Referral >> Aug 25, 2021  4:32 PM Gaetana Michaelis A wrote: Has patient seen PCP for this complaint? Yes.   *If NO, is insurance requiring patient see PCP for this issue before PCP can refer them? Referral for which specialty: OPHTHALMOLOGY Preferred provider/office: Patient has no preference but would like for them to be in [facility] and accept medicaid  Reason for referral: The patient has diabetic eye concerns

## 2021-08-26 ENCOUNTER — Other Ambulatory Visit: Payer: Self-pay

## 2021-08-26 ENCOUNTER — Ambulatory Visit: Payer: Medicaid Other | Attending: Internal Medicine | Admitting: Internal Medicine

## 2021-08-26 DIAGNOSIS — Z91199 Patient's noncompliance with other medical treatment and regimen due to unspecified reason: Secondary | ICD-10-CM

## 2021-08-26 NOTE — Telephone Encounter (Signed)
Will forward to provider  

## 2021-08-26 NOTE — Telephone Encounter (Signed)
Left message on voicemaiL information per Dr. Laural Benes.

## 2021-08-26 NOTE — Telephone Encounter (Signed)
Referral submitted for eye exam.

## 2021-08-26 NOTE — Progress Notes (Signed)
Patient no showed for this telephone visit.  

## 2021-08-28 MED ORDER — ACCU-CHEK AVIVA PLUS W/DEVICE KIT: PACK | 0 refills | Status: DC

## 2021-09-09 ENCOUNTER — Encounter: Payer: Medicaid Other | Admitting: Internal Medicine

## 2021-10-13 ENCOUNTER — Ambulatory Visit: Payer: Medicaid Other | Admitting: Internal Medicine

## 2021-10-20 ENCOUNTER — Other Ambulatory Visit: Payer: Self-pay

## 2021-10-20 ENCOUNTER — Other Ambulatory Visit: Payer: Self-pay | Admitting: Internal Medicine

## 2021-10-21 ENCOUNTER — Other Ambulatory Visit: Payer: Self-pay

## 2021-10-29 ENCOUNTER — Other Ambulatory Visit: Payer: Self-pay

## 2021-10-30 ENCOUNTER — Other Ambulatory Visit: Payer: Self-pay

## 2021-10-31 ENCOUNTER — Other Ambulatory Visit: Payer: Self-pay

## 2021-12-01 ENCOUNTER — Encounter (HOSPITAL_COMMUNITY): Payer: Self-pay | Admitting: Emergency Medicine

## 2021-12-01 ENCOUNTER — Emergency Department (HOSPITAL_COMMUNITY)
Admission: EM | Admit: 2021-12-01 | Discharge: 2021-12-01 | Disposition: A | Discharge status: Home / Self Care | Payer: Medicaid Other | Attending: Emergency Medicine | Admitting: Emergency Medicine

## 2021-12-01 DIAGNOSIS — Z794 Long term (current) use of insulin: Secondary | ICD-10-CM | POA: Diagnosis not present

## 2021-12-01 DIAGNOSIS — N390 Urinary tract infection, site not specified: Secondary | ICD-10-CM | POA: Insufficient documentation

## 2021-12-01 DIAGNOSIS — R739 Hyperglycemia, unspecified: Secondary | ICD-10-CM

## 2021-12-01 DIAGNOSIS — E1165 Type 2 diabetes mellitus with hyperglycemia: Secondary | ICD-10-CM | POA: Insufficient documentation

## 2021-12-01 LAB — CBG MONITORING, ED
Glucose-Capillary: 384 mg/dL — ABNORMAL HIGH (ref 70–99)
Glucose-Capillary: 324 mg/dL — ABNORMAL HIGH (ref 70–99)
Glucose-Capillary: 243 mg/dL — ABNORMAL HIGH (ref 70–99)

## 2021-12-01 LAB — URINALYSIS, ROUTINE W REFLEX MICROSCOPIC
Bilirubin Urine: NEGATIVE
Glucose, UA: >=500 mg/dL — AB
Ketones, ur: NEGATIVE mg/dL
Leukocytes,Ua: NEGATIVE
Nitrite: POSITIVE — AB
Protein, ur: NEGATIVE mg/dL
Specific Gravity, Urine: >1.03 — ABNORMAL HIGH (ref 1.005–1.030)
pH: 6 (ref 5.0–8.0)

## 2021-12-01 LAB — CBC
HCT: 45.8 % (ref 36.0–46.0)
Hemoglobin: 14.6 g/dL (ref 12.0–15.0)
MCH: 26.6 pg (ref 26.0–34.0)
MCHC: 31.9 g/dL (ref 30.0–36.0)
MCV: 83.6 fL (ref 80.0–100.0)
Platelets: 293 10*3/uL (ref 150–400)
RBC: 5.48 MIL/uL — ABNORMAL HIGH (ref 3.87–5.11)
RDW: 13.3 % (ref 11.5–15.5)
WBC: 8.4 10*3/uL (ref 4.0–10.5)
nRBC: 0 % (ref 0.0–0.2)

## 2021-12-01 LAB — BASIC METABOLIC PANEL
Anion gap: 10 (ref 5–15)
BUN: 17 mg/dL (ref 6–20)
CO2: 27 mmol/L (ref 22–32)
Calcium: 9.7 mg/dL (ref 8.9–10.3)
Chloride: 100 mmol/L (ref 98–111)
Creatinine, Ser: 0.89 mg/dL (ref 0.44–1.00)
GFR, Estimated: >60 mL/min (ref >60)
Glucose, Bld: 390 mg/dL — ABNORMAL HIGH (ref 70–99)
Potassium: 4.2 mmol/L (ref 3.5–5.1)
Sodium: 137 mmol/L (ref 135–145)

## 2021-12-01 LAB — URINALYSIS, MICROSCOPIC (REFLEX)

## 2021-12-01 LAB — I-STAT BETA HCG BLOOD, ED (MC, WL, AP ONLY): I-stat hCG, quantitative: <5 m[IU]/mL (ref <5)

## 2021-12-01 MED ORDER — CEPHALEXIN 500 MG PO CAPS: 500.0000 mg | ORAL_CAPSULE | Freq: Two times a day (BID) | ORAL | 0 refills | Status: DC

## 2021-12-01 MED ORDER — SODIUM CHLORIDE 0.9 % IV BOLUS
1000.0000 mL | Freq: Once | INTRAVENOUS | Status: AC
  Administered 2021-12-01: 1000 mL via INTRAVENOUS

## 2021-12-01 MED ORDER — INSULIN ASPART 100 UNIT/ML IJ SOLN
10.0000 [IU] | Freq: Once | INTRAMUSCULAR | Status: AC
  Administered 2021-12-01: 10 [IU] via SUBCUTANEOUS

## 2021-12-01 NOTE — ED Triage Notes (Signed)
Patient here for evaluation of hyperglycemia over the last few days despite no changes in diabetic medication routine. Patient takes lantus and metformin. Patient reports polyuria, denies blurred vision and lightheadedness. Patient alert, oriented, ambulatory, and in no apparent distress at this time.

## 2021-12-01 NOTE — Progress Notes (Signed)
Inpatient Diabetes Program Recommendations  AACE/ADA: New Consensus Statement on Inpatient Glycemic Control (2015)  Target Ranges:  Prepandial:   less than 140 mg/dL      Peak postprandial:   less than 180 mg/dL (1-2 hours)      Critically ill patients:  140 - 180 mg/dL   Lab Results  Component Value Date   GLUCAP 384 (H) 12/01/2021   HGBA1C 12.8 (A) 04/24/2021    Review of Glycemic Control  Diabetes history: DM 2 Outpatient Diabetes medications: Lantus 66 unit tid (Metformin and Trulicity in the past) Current orders for Inpatient glycemic control:  Being evaluated in ED  Spoke with pt at bedside. Pt expresses deep confusion and frustration about her diabetes. Pt had several misunderstandings related to diabetes diet and what type she is and what caused her diabetes. Did education for new diabetes diagnosis and covered A1c and glucose goals. Pt to call her insurance today for new PCP recommendations.   Inpatient Diabetes Program Recommendations:   For Outpatient: -   Lantus 66 units bid (was taking it tid) -   Add Humalog 10 units tid before the meals at home (has boxes at home)  Would benefit from being placed back on Trulicity when able to afford Copay.  Pt getting new PCP. Pt had a huge knowledge deficit about her diagnosis. Pt reports not being informed at her appointments and was taking her Lantus tid, she reports doctor specifically told her this but documentation does not relay tid dosing of Lantus. Pt is frustrated.   Thanks,  Christena Deem RN, MSN, BC-ADM Inpatient Diabetes Coordinator Team Pager [phone] (8a-5p)

## 2021-12-01 NOTE — Discharge Instructions (Addendum)
Take Keflex as prescribed and complete the full course. Follow-up with primary care as discussed with diabetes coordinator today.

## 2021-12-01 NOTE — ED Provider Notes (Signed)
[facility] Provider Note   CSN: [id number] Arrival date & time: 12/01/21  4970     History  Chief Complaint  Patient presents with   Hyperglycemia    Cindy Leonard is a 26 y.o. female.  26 year old female with history of insulin-dependent diabetes.  States that her blood sugars typically 100-200 range, had an edible otherwise diet unchanged and now her blood sugar is running 400-500.  Patient increased her insulin and has been using 66 units without being out of bring down her blood sugar.  Also drinking a gallon of water every [place].  States history of DKA, states that her vision has been a little blurry, she has been urinating frequently with foul smell to urine which concerned her and prompted her to come to the emergency room.  She denies fevers or recent illness, dysuria, nausea or vomiting.  No other complaints or concerns today.      Home Medications Prior to Admission medications   Medication Sig Start Date End Date Taking? Authorizing Provider  cephALEXin (KEFLEX) 500 MG capsule Take 1 capsule (500 mg total) by mouth 2 (two) times daily for 5 days. 12/01/21 12/06/21 Yes Tacy Learn, PA-C  ascorbic acid (VITAMIN C) 250 MG tablet Take 1 tablet (250 mg total) by mouth daily. 07/26/20   Sharen Hones, MD  Blood Glucose Monitoring Suppl (ACCU-CHEK AVIVA PLUS) w/Device KIT Use as directed 08/28/21   Ladell Pier, MD  Cholecalciferol 25 MCG (1000 UT) tablet Take 1,000 Units by mouth daily. 06/04/20   [provider]  glucose blood (ACCU-CHEK AVIVA PLUS) test strip Use as instructed 04/08/20   Clapacs, Madie Reno, MD  haloperidol (HALDOL) 0.5 MG tablet Take 15 tablets (7.5 mg total) by mouth at bedtime. Patient taking differently: Take 0.5 mg by mouth at bedtime. 07/25/20   Sharen Hones, MD  insulin lispro (HUMALOG KWIKPEN) 100 UNIT/ML KwikPen Inject 8 Units into the skin 3 (three) times daily. Take with breakfast, lunch and dinner.  07/01/21   Ladell Pier, MD  Insulin Pen Needle 31G X 8 MM MISC Use as directed 04/24/21   Ladell Pier, MD  Lancets (ACCU-CHEK SOFT [facility]) lancets Use as instructed 04/08/20   Clapacs, Madie Reno, MD  LANTUS SOLOSTAR 100 UNIT/ML Solostar Pen Inject 60 Units into the skin at bedtime. 04/24/21   Ladell Pier, MD  metFORMIN (GLUCOPHAGE) 500 MG tablet Take 2 tablets (1,000 mg total) by mouth 2 (two) times daily with a meal. 04/24/21   Ladell Pier, MD  TRAZODONE HCL PO Take 1 tablet by mouth at bedtime.    [provider]      Allergies    Blueberry [vaccinium angustifolium]    Review of Systems   Review of Systems  Constitutional:  Negative for chills and fever.  Eyes:  Positive for visual disturbance.  Respiratory:  Negative for shortness of breath.   Cardiovascular:  Negative for chest pain.  Gastrointestinal:  Negative for abdominal pain, nausea and vomiting.  Endocrine: Positive for polydipsia and polyuria.  Genitourinary:  Negative for dysuria.  Musculoskeletal:  Negative for arthralgias and myalgias.  Skin:  Negative for wound.  Allergic/Immunologic: Positive for immunocompromised state.  Neurological:  Negative for weakness.  Psychiatric/Behavioral:  Negative for confusion.   All other systems reviewed and are negative.  Physical Exam Updated Vital Signs BP (!) 118/93    Pulse 69    Temp 97.6 F (36.4 C) (Oral)  Resp (!) 21    LMP  (Within Months)    SpO2 99%  Physical Exam Vitals and nursing note reviewed.  Constitutional:      General: She is not in acute distress.    Appearance: She is well-developed. She is not diaphoretic.  HENT:     Head: Normocephalic and atraumatic.     Mouth/Throat:     Mouth: Mucous membranes are moist.  Eyes:     Conjunctiva/sclera: Conjunctivae normal.  Cardiovascular:     Rate and Rhythm: Normal rate and regular rhythm.     Heart sounds: Normal heart sounds.  Pulmonary:     Effort: Pulmonary effort is normal.      Breath sounds: Normal breath sounds.  Musculoskeletal:     Right lower leg: No edema.     Left lower leg: No edema.  Skin:    General: Skin is warm and dry.     Findings: No erythema or rash.  Neurological:     Mental Status: She is alert and oriented to person, place, and time.  Psychiatric:        Behavior: Behavior normal.    ED Results / Procedures / Treatments   Labs (all labs ordered are listed, but only abnormal results are displayed) Labs Reviewed  BASIC METABOLIC PANEL - Abnormal; Notable for the following components:      Result Value   Glucose, Bld 390 (*)    All other components within normal limits  CBC - Abnormal; Notable for the following components:   RBC 5.48 (*)    All other components within normal limits  URINALYSIS, ROUTINE W REFLEX MICROSCOPIC - Abnormal; Notable for the following components:   Specific Gravity, Urine >1.030 (*)    Glucose, UA >=500 (*)    Hgb urine dipstick TRACE (*)    Nitrite POSITIVE (*)    All other components within normal limits  URINALYSIS, MICROSCOPIC (REFLEX) - Abnormal; Notable for the following components:   Bacteria, UA MANY (*)    All other components within normal limits  CBG MONITORING, ED - Abnormal; Notable for the following components:   Glucose-Capillary 384 (*)    All other components within normal limits  CBG MONITORING, ED - Abnormal; Notable for the following components:   Glucose-Capillary 324 (*)    All other components within normal limits  CBG MONITORING, ED - Abnormal; Notable for the following components:   Glucose-Capillary 243 (*)    All other components within normal limits  I-STAT BETA HCG BLOOD, ED ([facility], [facility], [facility] ONLY)  CBG MONITORING, ED    EKG None  Radiology No results found.  Procedures Procedures    Medications Ordered in ED Medications  sodium chloride 0.9 % bolus 1,000 mL (1,000 mLs Intravenous New Bag/Given 12/01/21 1430)  insulin aspart (novoLOG) injection 10 Units (10 Units  Subcutaneous Given 12/01/21 1606)    ED Course/ Medical Decision Making/ A&P                           Medical Decision Making Amount and/or Complexity of Data Reviewed Labs: ordered.  Risk Prescription drug management.   26 year old female with history of diabetes and prior episode of DKA presents with concern for elevated blood sugar with polyuria, polydipsia, visual disturbance.  Patient reports compliance with her insulin and denies dietary changes. She is otherwise feeling well today without complaint.  On exam, patient is well-appearing, vision grossly intact at this time. Found to  have elevated glucose of 390.  Patient was given IV fluids as well as 10 units of subcu NovoLog.  Blood glucose has improved to 243, downtrending, may be discharged.  BMP does not indicate DKA.  Urinalysis also negative for ketones.  UA concerning for UTI although is a contaminated sample, is positive for nitrites with many bacteria.  Patient will be treated with Keflex.  Question if this infection is causing her blood sugar increase.  Discussed with diabetes coordinator who has also contacted patient and will be working with patient to improve her medication compliance and glucose readings. Patient agrees with plan of care, continues to feel well and is ready for discharge at this time.        Final Clinical Impression(s) / ED Diagnoses Final diagnoses:  Hyperglycemia  Urinary tract infection in female    Rx / DC Orders ED Discharge Orders          Ordered    cephALEXin (KEFLEX) 500 MG capsule  2 times daily        12/01/21 1634              Tacy Learn, PA-C 12/01/21 1719    Jeanell Sparrow, DO 12/01/21 1728

## 2021-12-03 ENCOUNTER — Ambulatory Visit (INDEPENDENT_AMBULATORY_CARE_PROVIDER_SITE_OTHER): Payer: Medicaid Other | Admitting: Clinical

## 2021-12-03 ENCOUNTER — Other Ambulatory Visit: Payer: Self-pay

## 2021-12-03 DIAGNOSIS — F250 Schizoaffective disorder, bipolar type: Secondary | ICD-10-CM | POA: Diagnosis not present
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
## 2021-12-04 ENCOUNTER — Ambulatory Visit (INDEPENDENT_AMBULATORY_CARE_PROVIDER_SITE_OTHER): Payer: Medicaid Other | Admitting: Psychiatry

## 2021-12-04 ENCOUNTER — Encounter (HOSPITAL_COMMUNITY): Payer: Self-pay | Admitting: Psychiatry

## 2021-12-04 VITALS — BP 150/95 | HR 74 | Temp 98.2°F | Ht 67.0 in | Wt 320.0 lb

## 2021-12-04 DIAGNOSIS — F5105 Insomnia due to other mental disorder: Secondary | ICD-10-CM

## 2021-12-04 DIAGNOSIS — F250 Schizoaffective disorder, bipolar type: Secondary | ICD-10-CM

## 2021-12-04 MED ORDER — TRAZODONE HCL 100 MG PO TABS: 100.0000 mg | ORAL_TABLET | Freq: Every day | ORAL | 1 refills | Status: DC

## 2021-12-04 MED ORDER — OXCARBAZEPINE 300 MG PO TABS: 300.0000 mg | ORAL_TABLET | Freq: Two times a day (BID) | ORAL | 1 refills | Status: DC

## 2021-12-04 MED ORDER — HALOPERIDOL 10 MG PO TABS: 10.0000 mg | ORAL_TABLET | Freq: Every day | ORAL | 1 refills | Status: DC
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
## 2021-12-08 LAB — HM DIABETES EYE EXAM

## 2021-12-10 ENCOUNTER — Other Ambulatory Visit: Payer: Self-pay

## 2021-12-11 ENCOUNTER — Other Ambulatory Visit: Payer: Self-pay

## 2022-01-06 ENCOUNTER — Encounter (HOSPITAL_COMMUNITY): Payer: Self-pay

## 2022-01-06 ENCOUNTER — Telehealth (HOSPITAL_COMMUNITY): Payer: Medicaid Other | Admitting: Psychiatry

## 2022-01-13 ENCOUNTER — Other Ambulatory Visit: Payer: Self-pay

## 2022-01-13 MED ORDER — INSULIN GLARGINE 100 UNIT/ML SOLOSTAR PEN
PEN_INJECTOR | SUBCUTANEOUS | 8 refills | Status: DC
  Filled 2022-01-13: qty 15, 25d supply, fill #0

## 2022-01-20 ENCOUNTER — Other Ambulatory Visit: Payer: Self-pay

## 2022-02-09 ENCOUNTER — Ambulatory Visit (HOSPITAL_COMMUNITY): Payer: Medicaid Other | Admitting: Clinical

## 2022-02-10 ENCOUNTER — Encounter (HOSPITAL_COMMUNITY): Payer: Self-pay | Admitting: Emergency Medicine

## 2022-02-10 ENCOUNTER — Emergency Department (HOSPITAL_COMMUNITY)
Admission: EM | Admit: 2022-02-10 | Discharge: 2022-02-11 | Disposition: A | Discharge status: Home / Self Care | Payer: Medicaid Other | Attending: Emergency Medicine | Admitting: Emergency Medicine

## 2022-02-10 DIAGNOSIS — Z79899 Other long term (current) drug therapy: Secondary | ICD-10-CM | POA: Diagnosis not present

## 2022-02-10 DIAGNOSIS — R739 Hyperglycemia, unspecified: Secondary | ICD-10-CM | POA: Insufficient documentation

## 2022-02-10 DIAGNOSIS — F1010 Alcohol abuse, uncomplicated: Secondary | ICD-10-CM | POA: Diagnosis present

## 2022-02-10 DIAGNOSIS — Y900 Blood alcohol level of less than 20 mg/100 ml: Secondary | ICD-10-CM | POA: Diagnosis not present

## 2022-02-10 DIAGNOSIS — Z794 Long term (current) use of insulin: Secondary | ICD-10-CM | POA: Diagnosis not present

## 2022-02-10 DIAGNOSIS — Z5321 Procedure and treatment not carried out due to patient leaving prior to being seen by health care provider: Secondary | ICD-10-CM | POA: Insufficient documentation

## 2022-02-10 HISTORY — DX: Type 2 diabetes mellitus without complications: E11.9

## 2022-02-10 LAB — URINALYSIS, ROUTINE W REFLEX MICROSCOPIC
Bilirubin Urine: NEGATIVE
Glucose, UA: >=500 mg/dL — AB
Ketones, ur: 20 mg/dL — AB
Nitrite: NEGATIVE
Protein, ur: NEGATIVE mg/dL
Specific Gravity, Urine: 1.032 — ABNORMAL HIGH (ref 1.005–1.030)
pH: 6 (ref 5.0–8.0)

## 2022-02-10 LAB — CBC WITH DIFFERENTIAL/PLATELET
Abs Immature Granulocytes: 0.02 10*3/uL (ref 0.00–0.07)
Basophils Absolute: 0.1 10*3/uL (ref 0.0–0.1)
Basophils Relative: 1 %
Eosinophils Absolute: 0.1 10*3/uL (ref 0.0–0.5)
Eosinophils Relative: 2 %
HCT: 44.5 % (ref 36.0–46.0)
Hemoglobin: 14.6 g/dL (ref 12.0–15.0)
Immature Granulocytes: 0 %
Lymphocytes Relative: 36 %
Lymphs Abs: 2.8 10*3/uL (ref 0.7–4.0)
MCH: 27.1 pg (ref 26.0–34.0)
MCHC: 32.8 g/dL (ref 30.0–36.0)
MCV: 82.7 fL (ref 80.0–100.0)
Monocytes Absolute: 0.4 10*3/uL (ref 0.1–1.0)
Monocytes Relative: 5 %
Neutro Abs: 4.5 10*3/uL (ref 1.7–7.7)
Neutrophils Relative %: 56 %
Platelets: 257 10*3/uL (ref 150–400)
RBC: 5.38 MIL/uL — ABNORMAL HIGH (ref 3.87–5.11)
RDW: 13.9 % (ref 11.5–15.5)
WBC: 7.9 10*3/uL (ref 4.0–10.5)
nRBC: 0 % (ref 0.0–0.2)

## 2022-02-10 LAB — COMPREHENSIVE METABOLIC PANEL
ALT: 17 U/L (ref 0–44)
AST: 15 U/L (ref 15–41)
Albumin: 3.7 g/dL (ref 3.5–5.0)
Alkaline Phosphatase: 61 U/L (ref 38–126)
Anion gap: 10 (ref 5–15)
BUN: 16 mg/dL (ref 6–20)
CO2: 24 mmol/L (ref 22–32)
Calcium: 9.4 mg/dL (ref 8.9–10.3)
Chloride: 101 mmol/L (ref 98–111)
Creatinine, Ser: 0.81 mg/dL (ref 0.44–1.00)
GFR, Estimated: >60 mL/min (ref >60)
Glucose, Bld: 346 mg/dL — ABNORMAL HIGH (ref 70–99)
Potassium: 4.1 mmol/L (ref 3.5–5.1)
Sodium: 135 mmol/L (ref 135–145)
Total Bilirubin: 0.8 mg/dL (ref 0.3–1.2)
Total Protein: 7.2 g/dL (ref 6.5–8.1)

## 2022-02-10 LAB — I-STAT VENOUS BLOOD GAS, ED
Acid-Base Excess: 2 mmol/L (ref 0.0–2.0)
Bicarbonate: 27.4 mmol/L (ref 20.0–28.0)
Calcium, Ion: 1.2 mmol/L (ref 1.15–1.40)
HCT: 44 % (ref 36.0–46.0)
Hemoglobin: 15 g/dL (ref 12.0–15.0)
O2 Saturation: 94 %
Potassium: 4 mmol/L (ref 3.5–5.1)
Sodium: 136 mmol/L (ref 135–145)
TCO2: 29 mmol/L (ref 22–32)
pCO2, Ven: 44.9 mmHg (ref 44–60)
pH, Ven: 7.394 (ref 7.25–7.43)
pO2, Ven: 70 mmHg — ABNORMAL HIGH (ref 32–45)

## 2022-02-10 LAB — I-STAT BETA HCG BLOOD, ED (MC, WL, AP ONLY): I-stat hCG, quantitative: <5 m[IU]/mL (ref <5)

## 2022-02-10 LAB — SALICYLATE LEVEL: Salicylate Lvl: <7 mg/dL — ABNORMAL LOW (ref 7.0–30.0)

## 2022-02-10 LAB — CBG MONITORING, ED: Glucose-Capillary: 331 mg/dL — ABNORMAL HIGH (ref 70–99)

## 2022-02-10 LAB — ETHANOL: Alcohol, Ethyl (B): <10 mg/dL (ref <10)

## 2022-02-10 LAB — LIPASE, BLOOD: Lipase: 49 U/L (ref 11–51)

## 2022-02-10 LAB — ACETAMINOPHEN LEVEL: Acetaminophen (Tylenol), Serum: <10 ug/mL — ABNORMAL LOW (ref 10–30)

## 2022-02-10 LAB — BETA-HYDROXYBUTYRIC ACID: Beta-Hydroxybutyric Acid: 0.49 mmol/L — ABNORMAL HIGH (ref 0.05–0.27)

## 2022-02-10 NOTE — ED Provider Triage Note (Signed)
Emergency Medicine Provider Triage Evaluation Note ? ?Cindy Leonard , a 26 y.o. female  was evaluated in triage.  Pt complains of hyperglycemia and alcohol use. ?She states that she has been drinking three half a gallon jugs of liquor a [place] for the past week.  She states that she has been taking her insulin.  She denies any fevers.  She states "I am just tired."  She doesn't voice any SI, or HI.  ? ? ?Physical Exam  ?BP 135/86 (BP Location: Right Arm)   Pulse 82   Temp 98.8 ?F (37.1 ?C) (Oral)   Resp 16   SpO2 97%  ?Gen:   Awake, no distress   ?Resp:  Normal effort  ?MSK:   Moves extremities without difficulty  ?Other:  Normal speech. ? ?Medical Decision Making  ?Medically screening exam initiated at 7:49 PM.  Appropriate orders placed.  Cindy Leonard was informed that the remainder of the evaluation will be completed by another provider, this initial triage assessment does not replace that evaluation, and the importance of remaining in the ED until their evaluation is complete. ? ? ?  ?Cristina Gong, [place] ?02/10/22 1958 ? ?

## 2022-02-10 NOTE — ED Triage Notes (Signed)
Patient here with complaint of hyperglycemia for the last two days, reports CBG 400 approximately 1700 today. Patient takes 66 units lantus in the evenings, last dose yesterday. Patient complains of polydipsia and polyuria, denies dizziness and blurred vision. Patient is alert, oriented, ambulatory, and in no apparent distress at this time. ?

## 2022-02-11 ENCOUNTER — Ambulatory Visit (HOSPITAL_COMMUNITY)
Admission: EM | Admit: 2022-02-11 | Discharge: 2022-02-11 | Disposition: A | Discharge status: Other Acute Inpatient Hospital | Payer: Medicaid Other | Attending: Psychiatry | Admitting: Psychiatry

## 2022-02-11 DIAGNOSIS — Z20822 Contact with and (suspected) exposure to covid-19: Secondary | ICD-10-CM | POA: Insufficient documentation

## 2022-02-11 DIAGNOSIS — F32A Depression, unspecified: Secondary | ICD-10-CM | POA: Diagnosis not present

## 2022-02-11 DIAGNOSIS — F1010 Alcohol abuse, uncomplicated: Secondary | ICD-10-CM | POA: Diagnosis not present

## 2022-02-11 DIAGNOSIS — R45851 Suicidal ideations: Secondary | ICD-10-CM | POA: Insufficient documentation

## 2022-02-11 LAB — CBC WITH DIFFERENTIAL/PLATELET
Abs Immature Granulocytes: 0.01 10*3/uL (ref 0.00–0.07)
Basophils Absolute: 0.1 10*3/uL (ref 0.0–0.1)
Basophils Relative: 1 %
Eosinophils Absolute: 0.2 10*3/uL (ref 0.0–0.5)
Eosinophils Relative: 2 %
HCT: 42.1 % (ref 36.0–46.0)
Hemoglobin: 14.2 g/dL (ref 12.0–15.0)
Immature Granulocytes: 0 %
Lymphocytes Relative: 49 %
Lymphs Abs: 3.5 10*3/uL (ref 0.7–4.0)
MCH: 28 pg (ref 26.0–34.0)
MCHC: 33.7 g/dL (ref 30.0–36.0)
MCV: 82.9 fL (ref 80.0–100.0)
Monocytes Absolute: 0.4 10*3/uL (ref 0.1–1.0)
Monocytes Relative: 5 %
Neutro Abs: 3.1 10*3/uL (ref 1.7–7.7)
Neutrophils Relative %: 43 %
Platelets: 256 10*3/uL (ref 150–400)
RBC: 5.08 MIL/uL (ref 3.87–5.11)
RDW: 14 % (ref 11.5–15.5)
WBC: 7.2 10*3/uL (ref 4.0–10.5)
nRBC: 0 % (ref 0.0–0.2)

## 2022-02-11 LAB — POCT URINE DRUG SCREEN - MANUAL ENTRY (I-SCREEN)
POC Amphetamine UR: NOT DETECTED
POC Buprenorphine (BUP): NOT DETECTED
POC Cocaine UR: NOT DETECTED
POC Marijuana UR: POSITIVE — AB
POC Methadone UR: NOT DETECTED
POC Methamphetamine UR: NOT DETECTED
POC Morphine: NOT DETECTED
POC Oxazepam (BZO): NOT DETECTED
POC Oxycodone UR: NOT DETECTED
POC Secobarbital (BAR): NOT DETECTED

## 2022-02-11 LAB — LIPID PANEL
Cholesterol: 157 mg/dL (ref 0–200)
HDL: 23 mg/dL — ABNORMAL LOW (ref >40)
LDL Cholesterol: UNDETERMINED mg/dL (ref 0–99)
Total CHOL/HDL Ratio: 6.8 RATIO
Triglycerides: 487 mg/dL — ABNORMAL HIGH (ref <150)
VLDL: UNDETERMINED mg/dL (ref 0–40)

## 2022-02-11 LAB — LDL CHOLESTEROL, DIRECT: Direct LDL: 74.6 mg/dL (ref 0–99)

## 2022-02-11 LAB — COMPREHENSIVE METABOLIC PANEL
ALT: 18 U/L (ref 0–44)
AST: 16 U/L (ref 15–41)
Albumin: 3.3 g/dL — ABNORMAL LOW (ref 3.5–5.0)
Alkaline Phosphatase: 67 U/L (ref 38–126)
Anion gap: 11 (ref 5–15)
BUN: 18 mg/dL (ref 6–20)
CO2: 22 mmol/L (ref 22–32)
Calcium: 9.4 mg/dL (ref 8.9–10.3)
Chloride: 101 mmol/L (ref 98–111)
Creatinine, Ser: 0.9 mg/dL (ref 0.44–1.00)
GFR, Estimated: >60 mL/min (ref >60)
Glucose, Bld: 407 mg/dL — ABNORMAL HIGH (ref 70–99)
Potassium: 3.9 mmol/L (ref 3.5–5.1)
Sodium: 134 mmol/L — ABNORMAL LOW (ref 135–145)
Total Bilirubin: 0.6 mg/dL (ref 0.3–1.2)
Total Protein: 6.3 g/dL — ABNORMAL LOW (ref 6.5–8.1)

## 2022-02-11 LAB — HEMOGLOBIN A1C
Hgb A1c MFr Bld: 12.5 % — ABNORMAL HIGH (ref 4.8–5.6)
Mean Plasma Glucose: 312.05 mg/dL

## 2022-02-11 LAB — RESP PANEL BY RT-PCR (FLU A&B, COVID) ARPGX2
Influenza A by PCR: NEGATIVE
Influenza B by PCR: NEGATIVE
SARS Coronavirus 2 by RT PCR: NEGATIVE

## 2022-02-11 LAB — GLUCOSE, CAPILLARY
Glucose-Capillary: 376 mg/dL — ABNORMAL HIGH (ref 70–99)
Glucose-Capillary: 295 mg/dL — ABNORMAL HIGH (ref 70–99)
Glucose-Capillary: 404 mg/dL — ABNORMAL HIGH (ref 70–99)

## 2022-02-11 LAB — TSH: TSH: 1.906 u[IU]/mL (ref 0.350–4.500)

## 2022-02-11 LAB — ETHANOL: Alcohol, Ethyl (B): <10 mg/dL (ref <10)

## 2022-02-11 LAB — MAGNESIUM: Magnesium: 1.9 mg/dL (ref 1.7–2.4)

## 2022-02-11 LAB — POCT PREGNANCY, URINE: Preg Test, Ur: NEGATIVE

## 2022-02-11 MED ORDER — ONDANSETRON 4 MG PO TBDP: 4.0000 mg | ORAL_TABLET | Freq: Four times a day (QID) | ORAL | Status: DC | PRN

## 2022-02-11 MED ORDER — LORAZEPAM 1 MG PO TABS
1.0000 mg | ORAL_TABLET | Freq: Four times a day (QID) | ORAL | Status: DC
Start: 2022-02-11 — End: 2022-02-11
  Administered 2022-02-11 (×2): 1 mg via ORAL
  Filled 2022-02-11 (×2): qty 1

## 2022-02-11 MED ORDER — THIAMINE HCL 100 MG PO TABS: 100.0000 mg | ORAL_TABLET | Freq: Every day | ORAL | Status: DC

## 2022-02-11 MED ORDER — LORAZEPAM 1 MG PO TABS
1.0000 mg | ORAL_TABLET | Freq: Every day | ORAL | Status: DC
Start: 2022-02-15 — End: 2022-02-11

## 2022-02-11 MED ORDER — INSULIN ASPART 100 UNIT/ML IJ SOLN
0.0000 [IU] | Freq: Three times a day (TID) | INTRAMUSCULAR | Status: DC
  Administered 2022-02-11: 6 [IU] via SUBCUTANEOUS
  Administered 2022-02-11: 3 [IU] via SUBCUTANEOUS

## 2022-02-11 MED ORDER — METHOCARBAMOL 500 MG PO TABS: 500.0000 mg | ORAL_TABLET | Freq: Three times a day (TID) | ORAL | Status: DC | PRN

## 2022-02-11 MED ORDER — CLONIDINE HCL 0.1 MG PO TABS
0.1000 mg | ORAL_TABLET | ORAL | Status: DC
Start: 2022-02-13 — End: 2022-02-11

## 2022-02-11 MED ORDER — ALUM & MAG HYDROXIDE-SIMETH 200-200-20 MG/5ML PO SUSP: 30.0000 mL | ORAL | Status: DC | PRN

## 2022-02-11 MED ORDER — DICYCLOMINE HCL 20 MG PO TABS: 20.0000 mg | ORAL_TABLET | Freq: Four times a day (QID) | ORAL | Status: DC | PRN

## 2022-02-11 MED ORDER — CLONIDINE HCL 0.1 MG PO TABS: 0.1000 mg | ORAL_TABLET | Freq: Four times a day (QID) | ORAL | Status: DC

## 2022-02-11 MED ORDER — NAPROXEN 500 MG PO TABS: 500.0000 mg | ORAL_TABLET | Freq: Two times a day (BID) | ORAL | Status: DC | PRN

## 2022-02-11 MED ORDER — LORAZEPAM 1 MG PO TABS: 1.0000 mg | ORAL_TABLET | Freq: Four times a day (QID) | ORAL | Status: DC | PRN

## 2022-02-11 MED ORDER — LORAZEPAM 1 MG PO TABS
1.0000 mg | ORAL_TABLET | Freq: Two times a day (BID) | ORAL | Status: DC
Start: 2022-02-13 — End: 2022-02-11

## 2022-02-11 MED ORDER — HYDROXYZINE HCL 25 MG PO TABS
25.0000 mg | ORAL_TABLET | Freq: Four times a day (QID) | ORAL | Status: DC | PRN
  Administered 2022-02-11: 25 mg via ORAL
  Filled 2022-02-11: qty 1

## 2022-02-11 MED ORDER — CLONIDINE HCL 0.1 MG PO TABS: 0.1000 mg | ORAL_TABLET | Freq: Every day | ORAL | Status: DC

## 2022-02-11 MED ORDER — LORAZEPAM 1 MG PO TABS: 1.0000 mg | ORAL_TABLET | Freq: Three times a day (TID) | ORAL | Status: DC

## 2022-02-11 MED ORDER — ADULT MULTIVITAMIN W/MINERALS CH
1.0000 | ORAL_TABLET | Freq: Every day | ORAL | Status: DC
  Administered 2022-02-11: 1 via ORAL
  Filled 2022-02-11 (×2): qty 1

## 2022-02-11 MED ORDER — INSULIN ASPART 100 UNIT/ML IJ SOLN: 0.0000 [IU] | Freq: Every day | INTRAMUSCULAR | Status: DC

## 2022-02-11 MED ORDER — THIAMINE HCL 100 MG/ML IJ SOLN
100.0000 mg | Freq: Once | INTRAMUSCULAR | Status: AC
  Administered 2022-02-11: 100 mg via INTRAMUSCULAR
  Filled 2022-02-11: qty 2

## 2022-02-11 MED ORDER — MAGNESIUM HYDROXIDE 400 MG/5ML PO SUSP
30.0000 mL | Freq: Every day | ORAL | Status: DC | PRN
Start: 2022-02-11 — End: 2022-02-11

## 2022-02-11 MED ORDER — ACETAMINOPHEN 325 MG PO TABS: 650.0000 mg | ORAL_TABLET | Freq: Four times a day (QID) | ORAL | Status: DC | PRN

## 2022-02-11 MED ORDER — LOPERAMIDE HCL 2 MG PO CAPS: 2.0000 mg | ORAL_CAPSULE | ORAL | Status: DC | PRN

## 2022-02-11 NOTE — Progress Notes (Addendum)
Inpatient Diabetes Program Recommendations ? ?AACE/ADA: New Consensus Statement on Inpatient Glycemic Control (2015) ? ?Target Ranges:  Prepandial:   less than 140 mg/dL ?     Peak postprandial:   less than 180 mg/dL (1-2 hours) ?     Critically ill patients:  140 - 180 mg/dL  ? ?Lab Results  ?Component Value Date  ? GLUCAP 295 (H) 02/11/2022  ? HGBA1C 12.5 (H) 02/11/2022  ? ? ?Review of Glycemic Control ? ?Diabetes history: type 2 ?Outpatient Diabetes medications: Lantus 60 units at HS, Humalog 8 units TID with meals, Metformin 1000 mg BID ?Current orders for Inpatient glycemic control: Novolog 0-6 units TID correction scale and Novolog 0-5 units HS scale ? ?Inpatient Diabetes Program Recommendations:   ?Noted that blood sugars have been greater than 180 mg/dl. Recommend starting Semglee 40 units at HS, changing Novolog correction scale to MODERATE TID, continuing HS scale. Titrate dosages as needed. Could start back Metformin 1000 mg BID.  ?Patient may not need the same Lantus dose that she takes at home, especially without the alcohol that she drinks at home.  ? ?Smith Mince RN BSN CDE ?Diabetes Coordinator ?Pager: [phone]  8am-5pm  ? ? ? ?

## 2022-02-11 NOTE — ED Notes (Signed)
Patient was assisted by staff with CBG. Patient received breakfast. Patient calm. Patient is now being cared for by RN. ?

## 2022-02-11 NOTE — ED Provider Notes (Signed)
[facility] Continuous Assessment Admission H&P ? ?Date: 02/11/22 ?Patient Name: Cindy Leonard ?MRN: [id number] ?Chief Complaint: No chief complaint on file. ?   ? ?Diagnoses:  ?Final diagnoses:  ?Suicidal ideations  ?Depression, unspecified depression type  ?Alcohol abuse  ? ? ?HPI: Cindy Leonard, 26 y.o, present to [facility] c/o of SI and depression,  according to ED records patient was just at [facility] and leave AMA. Pt report she is diagnoses with Bipolar and Schizophrenia.  She reported she last took her medication prior to coming in.   ? ? ?Copied from ED noted from 7:49 pm  on 02/10/22  Cindy Leonard , a 26 y.o. female  was evaluated in triage.  Pt complains of hyperglycemia and alcohol use. ?She states that she has been drinking three half a gallon jugs of liquor a [place] for the past week.  She states that she has been taking her insulin.  She denies any fevers.  She states "I am just tired."  She doesn't voice any SI, or HI.  ? ?Observation of patient, she is alert and oriented x 4,  speech clear,  mood very agitated and verbally insolent,  affect blunt.  Mood congruent with affect.  Pt at first refused to answer any question,  NP left patient for a few minute to allow her time,  and then return to room to conduct assessment.  Pt finally agree to answer questions.  Per the patient she is going through a lot,  she report been suicidal with plan to shot herself in the head,  when asked if she has access to a gun she stated yes,  her friend has one somewhere in [place].   ?Pt report she drinks half a gallon on liquor yesterday,  pt denies AVH,  HI or paranoia.  Pt report she drinks a gallon jug of liquor daily for the past week.  She report she took last took her medication yesterday. Pt has a history of DM II, and currently take insulin and metformin 500 mg BID. Pt BS was check,   ? Pt agree to staying for detox and evaluation   ? ? ?Recommend: inpatient observation with re-evaluation in the AM ?PHQ 2-9:   ?[employer] Visit from 12/04/2021 in [facility] Counselor from 12/03/2021 in [facility] Office Visit from 09/12/2018 in [facility]  ?Thoughts that you would be better off dead, or of hurting yourself in some way Not at all Not at all Not at all  ?PHQ-9 Total Score 10 14 12   ? ?  ?  ?Flowsheet Row ED from 02/10/2022 in [facility] EMERGENCY DEPARTMENT Office Visit from 12/04/2021 in [facility] Counselor from 12/03/2021 in [facility]  ?C-SSRS RISK CATEGORY Moderate Risk Moderate Risk No Risk  ? ?  ?  ? ?Total Time spent with patient: 20 minutes ? ?Musculoskeletal  ?Strength & Muscle Tone: within normal limits ?Gait & Station: normal ?Patient leans: N/A ? ?Psychiatric Specialty Exam  ?Presentation ?General Appearance: Casual ? ?Eye Contact:Good ? ?Speech:Clear and Coherent ? ?Speech Volume:Normal ? ?Handedness:Ambidextrous ? ? ?Mood and Affect  ?Mood:Anxious; Depressed; Hopeless ? ?Affect:Blunt; Tearful ? ? ?Thought Process  ?Thought Processes:Coherent ? ?Descriptions of Associations:Circumstantial ? ?Orientation:Full (Time, Place and Person) ? ?Thought Content:No data recorded Diagnosis of Schizophrenia or Schizoaffective disorder in past: Yes ?  ?Hallucinations:Hallucinations: None ? ?Ideas of Reference:None ? ?Suicidal Thoughts:Suicidal  Thoughts: Yes, Active ?SI Active Intent and/or Plan: Without Intent ? ?Homicidal Thoughts:Homicidal Thoughts: No ? ? ?Sensorium  ?Memory:Immediate Good ? ?Judgment:Fair ? ?Insight:Fair ? ? ?Executive Functions  ?Concentration:Fair ? ?Attention Span:Fair ? ?Recall:Fair ? ?Fund of Knowledge:Fair ? ?Language:Fair ? ? ?Psychomotor Activity  ?Psychomotor Activity:No data recorded ? ?Assets  ?Assets:No data recorded ? ?Sleep  ?Sleep:Sleep: Fair ? ? ?Nutritional Assessment (For OBS and FBC admissions only) ?Has the patient  had a weight loss or gain of 10 pounds or more in the last 3 months?: No ?Has the patient had a decrease in food intake/or appetite?: No ?Does the patient have dental problems?: No ?Does the patient have eating habits or behaviors that may be indicators of an eating disorder including binging or inducing vomiting?: No ?Has the patient recently lost weight without trying?: 0 ?Has the patient been eating poorly because of a decreased appetite?: 0 ?Malnutrition Screening Tool Score: 0 ? ? ? ?Physical Exam ?HENT:  ?   Head: Normocephalic.  ?   Nose: Nose normal.  ?Cardiovascular:  ?   Rate and Rhythm: Normal rate.  ?Pulmonary:  ?   Effort: Pulmonary effort is normal.  ?Musculoskeletal:     ?   General: Normal range of motion.  ?   Cervical back: Normal range of motion.  ?Skin: ?   General: Skin is warm.  ?Neurological:  ?   General: No focal deficit present.  ?   Mental Status: She is alert.  ?Psychiatric:     ?   Mood and Affect: Mood normal.     ?   Behavior: Behavior normal.     ?   Thought Content: Thought content normal.     ?   Judgment: Judgment normal.  ? ?Review of Systems  ?Constitutional: Negative.   ?HENT: Negative.    ?Eyes: Negative.   ?Respiratory: Negative.    ?Cardiovascular: Negative.   ?Gastrointestinal: Negative.   ?Genitourinary: Negative.   ?Skin: Negative.   ?Neurological: Negative.   ?Endo/Heme/Allergies: Negative.   ?Psychiatric/Behavioral:  Positive for depression, substance abuse and suicidal ideas.   ? ?Blood pressure 132/82, pulse 82, temperature (!) 97.5 ?F (36.4 ?C), temperature source Oral, resp. rate 16, SpO2 97 %. There is no height or weight on file to calculate BMI. ? ?Past Psychiatric History: Schizophrenia,  Depression   ? ?Is the patient at risk to self? Yes  ?Has the patient been a risk to self in the past 6 months? Yes .    ?Has the patient been a risk to self within the distant past? Yes   ?Is the patient a risk to others? No   ?Has the patient been a risk to others in the past  6 months? No   ?Has the patient been a risk to others within the distant past? No  ? ?Past Medical History:  ?Past Medical History:  ?Diagnosis Date  ? Bipolar 1 disorder (H[facility])   ? Depression   ? Diabetes mellitus without complication ([facility])   ? Schizophrenia ([facility]CC)   ?  ?Past Surgical History:  ?Procedure Laterality Date  ? INCISION AND DRAINAGE ABSCESS Left 12/08/2020  ? Procedure: INCISION AND DRAINAGE POSTERIOR NECK ABSCESS;  Surgeon: Gaynelle Adu, MD;  Location: [facility] OR;  Service: General;  Laterality: Left;  ? NO PAST SURGERIES    ? ? ?Family History:  ?Family History  ?Problem Relation Age of Onset  ? Stroke Mother   ? Diabetes Other   ? Hypertension Other   ? Asthma  Father   ? Asthma Brother   ? ? ?Social History:  ?Social History  ? ?Socioeconomic History  ? Marital status: Single  ?  Spouse name: Not on file  ? Number of children: Not on file  ? Years of education: Not on file  ? Highest education level: Not on file  ?Occupational History  ? Not on file  ?Tobacco Use  ? Smoking status: Never  ? Smokeless tobacco: Never  ? Tobacco comments:  ?  Used with Marijuana  ?Vaping Use  ? Vaping Use: Never used  ?Substance and Sexual Activity  ? Alcohol use: No  ? Drug use: Yes  ?  Types: Marijuana  ?  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 Sexual activity: Yes  ?  Birth control/protection: None  ?Other Topics Concern  ? Not on file  ?Social History Narrative  ? Not on file  ? ?Social Determinants of Health  ? ?Financial Resource Strain: Not on file  ?Food Insecurity: Not on file  ?Transportation Needs: Not on file  ?Physical Activity: Not on file  ?Stress: Not on file  ?Social Connections: Not on file  ?Intimate Partner Violence: Not on file  ? ? ?SDOH:  ?SDOH Screenings  ? ?Alcohol Screen: Not on file  ?Depression (PHQ2-9): Medium Risk  ? PHQ-2 Score: 10  ?Financial Resource Strain: Not on file  ?Food Insecurity: Not on file  ?Housing: Not on file  ?Physical Activity: Not on file  ?Social Connections:  Not on file  ?Stress: Not on file  ?Tobacco Use: Low Risk   ? Smoking Tobacco Use: Never  ? Smokeless Tobacco Use: Never  ? Passive Exposure: Not on file  ?Transportation Needs: Not on file  ? ? ?Last L

## 2022-02-11 NOTE — ED Notes (Signed)
Medicated with atarax for anxiety per order ?

## 2022-02-11 NOTE — Progress Notes (Signed)
Patient has been faxed out per the recommendation of Dr. Lucianne Muss. Patient meets BH inpatient criteria per Doran Heater, NP. Patient has been faxed out to the following facilities:  ? ?[facility]  [address] [place] [address] [phone]  ?[facility]  [address], [place] [address] [phone]  ?[facility]  [address], [place] [address] [phone]  ?[facility]  [address] [place] [address] [phone]  ?[facility]  [address] [place] [address] [phone]  ?[facility]  [address] [place], [place] [address] [phone]  ?[facility]  [address] [place], [place] [address] [phone]  ?[facility]  [address] [place] [address] [phone]  ?[facility]  [address] [place] [address] [phone]  ?[facility]  [address] [place] [address] [phone]  ?[facility]  [address] [place] [address] [phone]  ?[facility]  [address] [place] [address] [phone]  ? ?Damita Dunnings, MSW, LCSW-A  ?11:02 AM 02/11/2022   ?

## 2022-02-11 NOTE — ED Notes (Signed)
X1 no response 

## 2022-02-11 NOTE — Progress Notes (Signed)
[facility] LCSW Progress Note ?  ?02/11/2022    1:14 PM ? ?Cindy Leonard  ? ?[id number]  ? ?Type of Contact and Topic:  Psychiatric Bed Placement  ? ?[facility]   ? ?Patient meets inpatient criteria per Doran Heater, NP ? ?The attending provider will be Dr. Forrestine Him  ? ?Call report to [phone] ? ?Bedelia Person, RN @ [facility] notified.    ? ?Pt scheduled  to arrive at [facility] TODAY ANYTIME.  ? ? ?Damita Dunnings, MSW, LCSW-A  ?1:15 PM 02/11/2022   ?  ? ?  ?  ? ? ? ? ?  ?

## 2022-02-11 NOTE — ED Notes (Signed)
Report given to [facility].  Verbalized understanding.  ?

## 2022-02-11 NOTE — ED Notes (Signed)
Pt is awake and alert.  She has flat affect but brightens upon approach.  Mood is congruent.  Pt states that she is feeling some anxiety and does have thoughts of self harm.  At time of assessment pt denied having a plan.   Pt given ss insulin as ordered.  And given breakfast. Will continue to observe for safety.  ?

## 2022-02-11 NOTE — Progress Notes (Signed)
?   02/11/22 0238  ?Patient Reported Information  ?How Did You Hear About [place]? Self  ?What Is the Reason for Your Visit/Call Today? Pt reports, "I want to off myself," with a plan to shoot herself. Pt reports, she has access to gun. Pt reports, the trigger of her suicidal ideations is because she's tired of life.  ?How Long Has This Been Causing You Problems? 1 wk - 1 month  ?What Do You Feel Would Help You the Most Today? Treatment for Depression or other mood problem;Alcohol or Drug Use Treatment  ?Have You Recently Had Any Thoughts About Hurting Yourself? Yes  ?Are You Planning to Commit Suicide/Harm Yourself At This time? Yes  ?Have you Recently Had Thoughts About Hurting Someone Karolee Ohs? Yes ?(Pt would not expound.)  ?Are You Planning To Harm Someone At This Time? No  ?Have You Used Any Alcohol or Drugs in the Past 24 Hours? Yes  ?What Did You Use and How Much? Pt reports, drinking a bottle of 1800 and a jar of Moonshine at 5PM on 02/10/2022.  ?Do You Currently Have a Therapist/Psychiatrist? Yes  ?Name of Therapist/Psychiatrist Pt is linked to [facility] for medication management. Pt reports, her medications (Haldol, anxiety meds, and Trazodone does not work.)  ?CCA Screening Triage Referral Assessment  ?Type of Contact Face-to-Face  ?Location of Assessment [facility] Assessment Services  ?Provider location [facility] Assessment Services  ?Collateral Involvement Pt denies, having supports.  ?Patient Determined To Be At Risk for Harm To Self or Others Based on Review of Patient Reported Information or Presenting Complaint? Yes, for Self-Harm  ?Does Patient Present under Involuntary Commitment? No  ?[place] of Residence [place]  ?Patient Currently Receiving the Following Services: Medication Management  ?Determination of Need Emergent (2 hours)  ?Options For Referral Inpatient Hospitalization;BH Urgent Care;Medication Management;Outpatient Therapy  ? ? ?Determination of need: Emergent.  ? ? ?Redmond Pulling, MS, [facility],  CRC ?Triage Specialist ?[phone] ? ?

## 2022-02-11 NOTE — ED Notes (Signed)
Attempted to call report to [facility] Nurse  which is not available.  Will try back shortly.    ?
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
## 2022-02-11 NOTE — ED Notes (Signed)
Patient gave registration her labels. Pt not here ?

## 2022-02-12 LAB — PROLACTIN: Prolactin: 11.9 ng/mL (ref 4.8–23.3)

## 2022-02-23 ENCOUNTER — Ambulatory Visit (HOSPITAL_COMMUNITY): Payer: Medicaid Other | Admitting: Clinical

## 2022-03-05 ENCOUNTER — Encounter (HOSPITAL_COMMUNITY): Payer: Self-pay

## 2022-03-05 ENCOUNTER — Telehealth (HOSPITAL_COMMUNITY): Payer: Medicaid Other | Admitting: Psychiatry

## 2022-04-02 ENCOUNTER — Other Ambulatory Visit: Payer: Self-pay

## 2022-04-02 ENCOUNTER — Telehealth: Payer: Self-pay | Admitting: Internal Medicine

## 2022-04-02 MED ORDER — INSULIN GLARGINE 100 UNIT/ML SOLOSTAR PEN: 60.0000 [IU] | PEN_INJECTOR | Freq: Every day | SUBCUTANEOUS | 0 refills | Status: DC

## 2022-04-02 NOTE — Telephone Encounter (Signed)
Rx sent for a 25 Mau supply. She has not been seen since 04/2021. She no-showed her follow-up appointment in October of 2022. She will need an appt scheduled for additional refills.

## 2022-04-02 NOTE — Telephone Encounter (Signed)
Medication Refill - Medication: Rx #: [id number]  insulin glargine (LANTUS) 100 UNIT/ML Solostar Pen [[id number]]   Has the patient contacted their pharmacy? No.   Preferred Pharmacy (with phone number or street name): [facility] [address] [place], [place]. [address]   Has the patient been seen for an appointment in the last year OR does the patient have an upcoming appointment? Yes.    Agent: Please be advised that RX refills may take up to 3 business days. We ask that you follow-up with your pharmacy.

## 2022-04-03 ENCOUNTER — Other Ambulatory Visit: Payer: Self-pay

## 2022-04-03 NOTE — Telephone Encounter (Signed)
Message sent via MyChart advising of the below message from [place], [facility] and to call the office to schedule a visit for additional refills.

## 2022-07-03 ENCOUNTER — Other Ambulatory Visit: Payer: Self-pay

## 2022-07-03 ENCOUNTER — Other Ambulatory Visit: Payer: Self-pay | Admitting: Internal Medicine

## 2022-07-03 DIAGNOSIS — E1169 Type 2 diabetes mellitus with other specified complication: Secondary | ICD-10-CM

## 2022-07-20 ENCOUNTER — Emergency Department (HOSPITAL_COMMUNITY)
Admission: EM | Admit: 2022-07-20 | Discharge: 2022-07-21 | Disposition: A | Discharge status: Home / Self Care | Payer: Medicaid Other | Attending: Emergency Medicine | Admitting: Emergency Medicine

## 2022-07-20 ENCOUNTER — Encounter (HOSPITAL_COMMUNITY): Payer: Self-pay

## 2022-07-20 ENCOUNTER — Other Ambulatory Visit: Payer: Self-pay

## 2022-07-20 DIAGNOSIS — E1165 Type 2 diabetes mellitus with hyperglycemia: Secondary | ICD-10-CM | POA: Insufficient documentation

## 2022-07-20 DIAGNOSIS — Z794 Long term (current) use of insulin: Secondary | ICD-10-CM | POA: Diagnosis not present

## 2022-07-20 DIAGNOSIS — Z7984 Long term (current) use of oral hypoglycemic drugs: Secondary | ICD-10-CM | POA: Diagnosis not present

## 2022-07-20 DIAGNOSIS — N390 Urinary tract infection, site not specified: Secondary | ICD-10-CM | POA: Insufficient documentation

## 2022-07-20 DIAGNOSIS — I10 Essential (primary) hypertension: Secondary | ICD-10-CM | POA: Insufficient documentation

## 2022-07-20 DIAGNOSIS — R739 Hyperglycemia, unspecified: Secondary | ICD-10-CM | POA: Diagnosis present

## 2022-07-20 LAB — COMPREHENSIVE METABOLIC PANEL
ALT: 21 U/L (ref 0–44)
AST: 15 U/L (ref 15–41)
Albumin: 3.8 g/dL (ref 3.5–5.0)
Alkaline Phosphatase: 54 U/L (ref 38–126)
Anion gap: 9 (ref 5–15)
BUN: 9 mg/dL (ref 6–20)
CO2: 23 mmol/L (ref 22–32)
Calcium: 9.1 mg/dL (ref 8.9–10.3)
Chloride: 104 mmol/L (ref 98–111)
Creatinine, Ser: 0.9 mg/dL (ref 0.44–1.00)
GFR, Estimated: >60 mL/min (ref >60)
Glucose, Bld: 292 mg/dL — ABNORMAL HIGH (ref 70–99)
Potassium: 4 mmol/L (ref 3.5–5.1)
Sodium: 136 mmol/L (ref 135–145)
Total Bilirubin: 0.8 mg/dL (ref 0.3–1.2)
Total Protein: 7.4 g/dL (ref 6.5–8.1)

## 2022-07-20 LAB — CBC WITH DIFFERENTIAL/PLATELET
Abs Immature Granulocytes: 0.02 10*3/uL (ref 0.00–0.07)
Basophils Absolute: 0.1 10*3/uL (ref 0.0–0.1)
Basophils Relative: 1 %
Eosinophils Absolute: 0.2 10*3/uL (ref 0.0–0.5)
Eosinophils Relative: 3 %
HCT: 45.4 % (ref 36.0–46.0)
Hemoglobin: 14.7 g/dL (ref 12.0–15.0)
Immature Granulocytes: 0 %
Lymphocytes Relative: 34 %
Lymphs Abs: 2.6 10*3/uL (ref 0.7–4.0)
MCH: 28.1 pg (ref 26.0–34.0)
MCHC: 32.4 g/dL (ref 30.0–36.0)
MCV: 86.6 fL (ref 80.0–100.0)
Monocytes Absolute: 0.5 10*3/uL (ref 0.1–1.0)
Monocytes Relative: 6 %
Neutro Abs: 4.2 10*3/uL (ref 1.7–7.7)
Neutrophils Relative %: 56 %
Platelets: 284 10*3/uL (ref 150–400)
RBC: 5.24 MIL/uL — ABNORMAL HIGH (ref 3.87–5.11)
RDW: 13.9 % (ref 11.5–15.5)
WBC: 7.5 10*3/uL (ref 4.0–10.5)
nRBC: 0 % (ref 0.0–0.2)

## 2022-07-20 LAB — URINALYSIS, ROUTINE W REFLEX MICROSCOPIC
Bilirubin Urine: NEGATIVE
Glucose, UA: >=500 mg/dL — AB
Ketones, ur: 5 mg/dL — AB
Nitrite: POSITIVE — AB
Protein, ur: NEGATIVE mg/dL
Specific Gravity, Urine: 1.029 (ref 1.005–1.030)
pH: 5 (ref 5.0–8.0)

## 2022-07-20 LAB — I-STAT VENOUS BLOOD GAS, ED
Acid-Base Excess: 1 mmol/L (ref 0.0–2.0)
Bicarbonate: 26.3 mmol/L (ref 20.0–28.0)
Calcium, Ion: 1.18 mmol/L (ref 1.15–1.40)
HCT: 45 % (ref 36.0–46.0)
Hemoglobin: 15.3 g/dL — ABNORMAL HIGH (ref 12.0–15.0)
O2 Saturation: 76 %
Potassium: 4.1 mmol/L (ref 3.5–5.1)
Sodium: 139 mmol/L (ref 135–145)
TCO2: 28 mmol/L (ref 22–32)
pCO2, Ven: 45 mmHg (ref 44–60)
pH, Ven: 7.375 (ref 7.25–7.43)
pO2, Ven: 42 mmHg (ref 32–45)

## 2022-07-20 LAB — I-STAT BETA HCG BLOOD, ED (MC, WL, AP ONLY): I-stat hCG, quantitative: <5 m[IU]/mL (ref <5)

## 2022-07-20 LAB — CBG MONITORING, ED: Glucose-Capillary: 252 mg/dL — ABNORMAL HIGH (ref 70–99)

## 2022-07-20 NOTE — ED Provider Triage Note (Signed)
Emergency Medicine Provider Triage Evaluation Note  Cindy Leonard , a 26 y.o. female  was evaluated in triage.  Pt complains of hyperglycemia.  Has been using her insulin as prescribed.  States her blood sugars are greater than 500 today.  She has had some polyuria.  No change in diet. Review of Systems  Positive: Hyperglycemia, polyurea Negative:   Physical Exam  BP (!) 126/94   Pulse 80   Temp 98.6 F (37 C) (Oral)   Resp (!) 21   Ht 5\' 7"  (1.702 m)   Wt (!) 145.2 kg   SpO2 98%   BMI 50.12 kg/m  Gen:   Awake, no distress   Resp:  Normal effort  MSK:   Moves extremities without difficulty  Other:    Medical Decision Making  Medically screening exam initiated at 6:25 PM.  Appropriate orders placed.  Cindy Leonard was informed that the remainder of the evaluation will be completed by another provider, this initial triage assessment does not replace that evaluation, and the importance of remaining in the ED until their evaluation is complete.  hyperglycemia   Renia Mikelson A, PA-C 07/20/22 1826

## 2022-07-20 NOTE — ED Triage Notes (Signed)
Reports cbg is >500  no other symptoms or complaints exceept ketone breath.

## 2022-07-21 LAB — CBG MONITORING, ED
Glucose-Capillary: 453 mg/dL — ABNORMAL HIGH (ref 70–99)
Glucose-Capillary: 149 mg/dL — ABNORMAL HIGH (ref 70–99)

## 2022-07-21 MED ORDER — CEPHALEXIN 250 MG PO CAPS
500.0000 mg | ORAL_CAPSULE | Freq: Once | ORAL | Status: AC
Start: 2022-07-21 — End: 2022-07-21
  Administered 2022-07-21: 500 mg via ORAL
  Filled 2022-07-21: qty 2

## 2022-07-21 MED ORDER — CEPHALEXIN 500 MG PO CAPS: 500.0000 mg | ORAL_CAPSULE | Freq: Four times a day (QID) | ORAL | 0 refills | Status: DC

## 2022-07-21 MED ORDER — INSULIN ASPART 100 UNIT/ML IJ SOLN
10.0000 [IU] | Freq: Once | INTRAMUSCULAR | Status: AC
  Administered 2022-07-21: 10 [IU] via INTRAVENOUS

## 2022-07-21 MED ORDER — LACTATED RINGERS IV BOLUS
1000.0000 mL | Freq: Once | INTRAVENOUS | Status: AC
  Administered 2022-07-21: 1000 mL via INTRAVENOUS

## 2022-07-21 NOTE — ED Notes (Signed)
Patient states she feels her cbg is high. Cbg checked and reported to triage PA

## 2022-07-21 NOTE — ED Provider Notes (Signed)
[facility] EMERGENCY DEPARTMENT Provider Note   CSN: [id number] Arrival date & time: 07/20/22  1721     History  Chief Complaint  Patient presents with   Hyperglycemia    Cindy Leonard is a 26 y.o. female.  The patient presents to the emergency department via ambulance complaining of hyperglycemia.  The patient states she has a history of diabetic ketoacidosis and is scared that she may be having the same.  She feels that her breath is "sweet".  She reports a home glucose reading of over 500.  Upon arrival she had no other symptoms or complaints.  Upon arrival at the emergency department her glucose was found to be 252.  She denies shortness of breath, abdominal pain, nausea, vomiting, chest pain, headache, lightheadedness, dysuria.  She states that she takes insulin at home and that she has been consistent with her medication usage.  Past medical history significant for uncontrolled type II DM, morbid obesity, bipolar disorder, schizophrenia, hypertension, history of DKA, depression, and others  HPI     Home Medications Prior to Admission medications   Medication Sig Start Date End Date Taking? Authorizing Provider  cephALEXin (KEFLEX) 500 MG capsule Take 1 capsule (500 mg total) by mouth 4 (four) times daily. 07/21/22  Yes Cherlynn June B, PA-C  Blood Glucose Monitoring Suppl (ACCU-CHEK AVIVA PLUS) w/Device KIT Use as directed 08/28/21   Ladell Pier, MD  Carboxymethylcellulose Sodium (DRY EYE RELIEF OP) Place 2 drops into both eyes daily as needed (For dry eyes).    [provider]  glucose blood (ACCU-CHEK AVIVA PLUS) test strip Use as instructed 04/08/20   Clapacs, Madie Reno, MD  haloperidol (HALDOL) 10 MG tablet Take 1 tablet (10 mg total) by mouth daily. 12/04/21 12/04/22  Penn, Lunette Stands, NP  insulin glargine (LANTUS) 100 UNIT/ML Solostar Pen Inject 60 Units into the skin daily. 04/02/22   Ladell Pier, MD  Insulin Pen Needle 31G X 8 MM MISC Use as  directed 04/24/21   Ladell Pier, MD  Lancets (ACCU-CHEK SOFT [facility]) lancets Use as instructed 04/08/20   Clapacs, Madie Reno, MD  metFORMIN (GLUCOPHAGE) 500 MG tablet Take 2 tablets (1,000 mg total) by mouth 2 (two) times daily with a meal. 04/24/21   Ladell Pier, MD  Oxcarbazepine (TRILEPTAL) 300 MG tablet Take 1 tablet (300 mg total) by mouth 2 (two) times daily. 12/04/21   Franne Grip, NP      Allergies    Blueberry [vaccinium angustifolium]    Review of Systems   Review of Systems  Constitutional:  Negative for fever.  Respiratory:  Negative for shortness of breath.   Cardiovascular:  Negative for chest pain.  Gastrointestinal:  Negative for abdominal pain, nausea and vomiting.  Genitourinary:  Negative for difficulty urinating, dysuria, frequency and hematuria.  Neurological:  Negative for weakness.  Psychiatric/Behavioral:  Negative for confusion.     Physical Exam Updated Vital Signs BP 118/80   Pulse (!) 51   Temp 97.8 F (36.6 C) (Oral)   Resp 20   Ht _0  (1.702 m)   Wt (!) 145.2 kg   SpO2 100%   BMI 50.12 kg/m  Physical Exam Vitals and nursing note reviewed.  Constitutional:      General: She is not in acute distress.    Appearance: She is well-developed. She is obese.  HENT:     Head: Normocephalic and atraumatic.  Eyes:     Extraocular Movements: Extraocular movements intact.  Conjunctiva/sclera: Conjunctivae normal.  Cardiovascular:     Rate and Rhythm: Normal rate and regular rhythm.     Heart sounds: No murmur heard. Pulmonary:     Effort: Pulmonary effort is normal. No respiratory distress.     Breath sounds: Normal breath sounds.  Abdominal:     Palpations: Abdomen is soft.     Tenderness: There is no abdominal tenderness.  Musculoskeletal:        General: No swelling.     Cervical back: Neck supple.  Skin:    General: Skin is warm and dry.     Capillary Refill: Capillary refill takes less than 2 seconds.  Neurological:     Mental  Status: She is alert and oriented to person, place, and time.  Psychiatric:        Mood and Affect: Mood normal.     ED Results / Procedures / Treatments   Labs (all labs ordered are listed, but only abnormal results are displayed) Labs Reviewed  CBC WITH DIFFERENTIAL/PLATELET - Abnormal; Notable for the following components:      Result Value   RBC 5.24 (*)    All other components within normal limits  COMPREHENSIVE METABOLIC PANEL - Abnormal; Notable for the following components:   Glucose, Bld 292 (*)    All other components within normal limits  URINALYSIS, ROUTINE W REFLEX MICROSCOPIC - Abnormal; Notable for the following components:   Color, Urine AMBER (*)    APPearance CLOUDY (*)    Glucose, UA >=500 (*)    Hgb urine dipstick SMALL (*)    Ketones, ur 5 (*)    Nitrite POSITIVE (*)    Leukocytes,Ua SMALL (*)    Bacteria, UA MANY (*)    All other components within normal limits  I-STAT VENOUS BLOOD GAS, ED - Abnormal; Notable for the following components:   Hemoglobin 15.3 (*)    All other components within normal limits  CBG MONITORING, ED - Abnormal; Notable for the following components:   Glucose-Capillary 252 (*)    All other components within normal limits  CBG MONITORING, ED - Abnormal; Notable for the following components:   Glucose-Capillary 453 (*)    All other components within normal limits  CBG MONITORING, ED - Abnormal; Notable for the following components:   Glucose-Capillary 149 (*)    All other components within normal limits  I-STAT BETA HCG BLOOD, ED ([facility], [facility], [facility] ONLY)    EKG EKG Interpretation  Date/Time:  Monday July 20 2022 18:12:39 EDT Ventricular Rate:  72 PR Interval:  160 QRS Duration: 78 QT Interval:  386 QTC Calculation: 422 R Axis:   54 Text Interpretation: Normal sinus rhythm Cannot rule out Anterior infarct , age undetermined Abnormal ECG similar to April 2023 Confirmed by Sherwood Gambler [phone]) on 07/21/2022 7:41:08  AM  Radiology No results found.  Procedures Procedures    Medications Ordered in ED Medications  cephALEXin (KEFLEX) capsule 500 mg (has no administration in time range)  lactated ringers bolus 1,000 mL (1,000 mLs Intravenous New Bag/Given 07/21/22 0816)  insulin aspart (novoLOG) injection 10 Units (10 Units Intravenous Given 07/21/22 0810)    ED Course/ Medical Decision Making/ A&P                           Medical Decision Making Risk Prescription drug management.   This patient presents to the ED for concern of hyperglycemia, this involves an extensive number of treatment options, and  is a complaint that carries with it a high risk of complications and morbidity.  The differential diagnosis includes hyperglycemia without DKA or HHS, DKA, HHS, hyperglycemia due to infection, due to medication compliance, and others   Co morbidities that complicate the patient evaluation  History of DKA, type II DM   Additional history obtained:  Additional history obtained from EMS External records from outside source obtained and reviewed including notes from internal medicine showing that the patient has not been seen in person since June 2022 with a subsequent no-show in October 2022.  Patient at that time was advised to see a provider in person prior to any Lantus refills   Lab Tests:  I Ordered, and personally interpreted labs.  The pertinent results include: Venous blood gas grossly normal other than hemoglobin 15.3, grossly normal CBC, glucose 292 on CMP, CBG at 0642 was 453, negative i-STAT beta pregnancy test, urinalysis positive for nitrites, small leukocytes, many bacteria, small hemoglobin suggestive of infection, CBG at 0937 was 149    Cardiac Monitoring: / EKG:  The patient was maintained on a cardiac monitor.  I personally viewed and interpreted the cardiac monitored which showed an underlying rhythm of: Normal sinus rhythm   Problem List / ED Course / Critical  interventions / Medication management   I ordered medication including lactated Ringer's for rehydration and 10 units of insulin for hyperglycemia, Keflex for UTI Reevaluation of the patient after these medicines showed that the patient improved I have reviewed the patients home medicines and have made adjustments as needed   Test / Admission - Considered:  The patient is not in DKA or HHS.  I believe she may be hypoglycemic due to her underlying urinary tract infection and also wonder if there may be an element of noncompliance with medication.  Plan to discharge patient home at this time.  There is no indication for admission.  Antibiotic prescription provided for urinary tract coverage        Final Clinical Impression(s) / ED Diagnoses Final diagnoses:  Hyperglycemia  Urinary tract infection with hematuria, site unspecified    Rx / DC Orders ED Discharge Orders          Ordered    cephALEXin (KEFLEX) 500 MG capsule  4 times daily        07/21/22 0901              Dorothyann Peng, PA-C 07/21/22 0944    Fransico Meadow, MD 07/23/22 1055

## 2022-07-21 NOTE — Discharge Instructions (Addendum)
You were seen today for hyperglycemia.  Your work-up was reassuring for no signs of diabetic ketoacidosis or HHS.  Please follow-up with your primary care provider for further evaluation and management of your hyperglycemia.  You were found to have a urinary tract infection today please take the prescribed antibiotic.  If you develop altered mental status or other life-threatening conditions please return to the emergency department

## 2022-07-28 ENCOUNTER — Encounter (HOSPITAL_COMMUNITY): Payer: Self-pay | Admitting: Psychiatry

## 2022-07-28 ENCOUNTER — Ambulatory Visit (INDEPENDENT_AMBULATORY_CARE_PROVIDER_SITE_OTHER): Payer: Medicaid Other | Admitting: Psychiatry

## 2022-07-28 VITALS — BP 118/76 | HR 83 | Ht 67.0 in | Wt 303.0 lb

## 2022-07-28 DIAGNOSIS — F5105 Insomnia due to other mental disorder: Secondary | ICD-10-CM | POA: Diagnosis not present

## 2022-07-28 DIAGNOSIS — F411 Generalized anxiety disorder: Secondary | ICD-10-CM | POA: Diagnosis not present

## 2022-07-28 DIAGNOSIS — F250 Schizoaffective disorder, bipolar type: Secondary | ICD-10-CM

## 2022-07-28 MED ORDER — HYDROXYZINE HCL 10 MG PO TABS: 10.0000 mg | ORAL_TABLET | Freq: Three times a day (TID) | ORAL | 3 refills | Status: DC | PRN

## 2022-07-28 MED ORDER — TRAZODONE HCL 50 MG PO TABS: 50.0000 mg | ORAL_TABLET | Freq: Every evening | ORAL | 3 refills | Status: DC | PRN

## 2022-07-28 MED ORDER — HALOPERIDOL DECANOATE 100 MG/ML IM SOLN: 100.0000 mg | INTRAMUSCULAR | 11 refills | Status: DC

## 2022-07-28 MED ORDER — FLUOXETINE HCL 20 MG PO CAPS: 20.0000 mg | ORAL_CAPSULE | Freq: Every day | ORAL | 2 refills | Status: DC

## 2022-07-28 MED ORDER — HALOPERIDOL DECANOATE 100 MG/ML IM SOLN: 100.0000 mg | Freq: Once | INTRAMUSCULAR | Status: DC

## 2022-07-28 NOTE — Progress Notes (Signed)
BH MD/PA/NP OP Progress Note  07/28/2022 8:53 AM Cindy Leonard  MRN:  [id number]  Chief Complaint: " I need to establish care" Chief Complaint  Patient presents with   New Patient (Initial Visit)   HPI: 26 year old female seen today for follow-up psychiatric evaluation.  She has a psychiatric history of schizoaffective disorder bipolar type, insomnia, anxiety, bipolar affective disorder, SI/HI, cocaine use, and marijuana use.  Currently she is managed on Haldol decanoate 100 mg monthly, Prozac 20 mg daily, and Nicorette gum.  Patient informed Probation officer that she dislikes the gum as she does not smoke tobacco and has discontinued it.  Today she was well-groomed, pleasant, cooperative, and engaged in conversation.  She informed Probation officer that she needs to establish care as she was recently discharged from [facility] in [place].  Patient was admitted from 07/22/2022 through 07/27/2022.  She informed Probation officer that she was arrested prior to her hospital admission due to issues with cocaine.  Patient notes that she also was at [facility] where she received substance use care.  Since her hospitalization she notes that she no longer misuses cocaine or marijuana.  She informed Probation officer that her mood is stable and denies symptoms of psychosis.    Patient informed writer that at times she is anxious about life.  She notes that she would like to work however cannot due to being on disability and only being allowed to work certain hours.  She informed Probation officer that she worries about finances and getting a home for herself.  Currently she lives with her grandmother notes that their relationship is toxic.  Today provider conducted a GAD-7 and patient scored a 13.  Provider also conducted PHQ-9 patient scored a 10.  She endorses sleeping 2 hours nightly.  She informed Probation officer that she is lost over 60 pounds over the last 6 months.  Today she denies SI/HI/VAH, mania, paranoia.  Patient reports that she has  daily back pain.  Currently she is not taking medications to help with this.  Today she is agreeable to starting hydroxyzine 10 mg 3 times daily as needed to help manage with anxiety.  She will also start trazodone 50 mg nightly as needed to help manage sleep. Potential side effects of medication and risks vs benefits of treatment vs non-treatment were explained and discussed. All questions were answered. She will continue her other medication as prescribed.  Patient will follow-up in a month to receive her injection. Visit Diagnosis:    ICD-10-CM   1. 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 Generalized anxiety disorder  F41.1 traZODone (DESYREL) 50 MG tablet    hydrOXYzine (ATARAX) 10 MG tablet    FLUoxetine (PROZAC) 20 MG capsule    Ambulatory referral to Social Work      Past Psychiatric History: Schizophrenia,Bipolars affective disorder, SI/HI, cocaine use, marijuana use  Past Medical History:  Past Medical History:  Diagnosis Date   Bipolar 1 disorder ([place])    Depression    Diabetes mellitus without complication ([place])    Schizophrenia ([place])     Past Surgical History:  Procedure Laterality Date   INCISION AND DRAINAGE ABSCESS Left 12/08/2020   Procedure: INCISION AND DRAINAGE POSTERIOR NECK ABSCESS;  Surgeon:  Greer Pickerel, MD;  Location: [place];  Service: General;  Laterality: Left;   NO PAST SURGERIES      Family Psychiatric History: Maternal aunt: schizophrenia and Grandmother: current crack cocaine use  Family History:  Family History  Problem Relation Age of Onset   Stroke Mother    Diabetes Other    Hypertension Other    Asthma Father    Asthma Brother     Social History:  Social History    Socioeconomic History   Marital status: Single    Spouse name: Not on file   Number of children: Not on file   Years of education: Not on file   Highest education level: Not on file  Occupational History   Not on file  Tobacco Use   Smoking status: Never   Smokeless tobacco: Never   Tobacco comments:    Used with Marijuana  Vaping Use   Vaping Use: Never used  Substance and Sexual Activity   Alcohol use: No   Drug use: Yes    Types: Marijuana    Comment: clean of cocaine x 3 yrs. Daily Marijuana use   Sexual activity: Yes    Birth control/protection: None  Other Topics Concern   Not on file  Social History Narrative   Not on file   Social Determinants of Health   Financial Resource Strain: Not on file  Food Insecurity: Food Insecurity Present (10/07/2018)   Hunger Vital Sign    Worried About Running Out of Food in the Last Year: Sometimes true    Ran Out of Food in the Last Year: Sometimes true  Transportation Needs: Not on file  Physical Activity: Not on file  Stress: Not on file  Social Connections: Not on file    Allergies:  Allergies  Allergen Reactions   Blueberry [Vaccinium Angustifolium] Anaphylaxis    Metabolic Disorder Labs: Lab Results  Component Value Date   HGBA1C 12.5 (H) 02/11/2022   MPG 312.05 02/11/2022   MPG >398 12/09/2020   Lab Results  Component Value Date   PROLACTIN 11.9 02/11/2022   Lab Results  Component Value Date   CHOL 157 02/11/2022   TRIG 487 (H) 02/11/2022   HDL 23 (L) 02/11/2022   CHOLHDL 6.8 02/11/2022   VLDL UNABLE TO CALCULATE IF TRIGLYCERIDE OVER 400 mg/dL 02/11/2022   LDLCALC UNABLE TO CALCULATE IF TRIGLYCERIDE OVER 400 mg/dL 02/11/2022   Lab Results  Component Value Date   TSH 1.906 02/11/2022    Therapeutic Level Labs: No results found for: "LITHIUM" No results found for: "VALPROATE" No results found for: "CBMZ"  Current Medications: Current Outpatient Medications  Medication Sig Dispense Refill    Blood Glucose Monitoring Suppl (ACCU-CHEK AVIVA PLUS) w/Device KIT Use as directed 1 kit 0   Carboxymethylcellulose Sodium (DRY EYE RELIEF OP) Place 2 drops into both eyes daily as needed (For dry eyes).     cephALEXin (KEFLEX) 500 MG capsule Take 1 capsule (500 mg total) by mouth 4 (four) times daily. 28 capsule 0   FLUoxetine (PROZAC) 20 MG capsule Take 1 capsule (20 mg total) by mouth daily. 30 capsule 2   glucose blood (ACCU-CHEK AVIVA PLUS) test strip Use as instructed 100 each 12   haloperidol decanoate (HALDOL DECANOATE) 100 MG/ML injection Inject 1 mL (100 mg total) into the muscle every 28 (twenty-eight) days. 1 mL 11   hydrOXYzine (ATARAX) 10 MG tablet Take 1 tablet (10 mg total) by mouth 3 (three) times daily as needed. 90 tablet  3   insulin glargine (LANTUS) 100 UNIT/ML Solostar Pen Inject 60 Units into the skin daily. 15 mL 0   Insulin Pen Needle 31G X 8 MM MISC Use as directed 100 each 6   Lancets (ACCU-CHEK SOFT TOUCH) lancets Use as instructed 100 each 12   metFORMIN (GLUCOPHAGE) 500 MG tablet Take 2 tablets (1,000 mg total) by mouth 2 (two) times daily with a meal. 120 tablet 6   Oxcarbazepine (TRILEPTAL) 300 MG tablet Take 1 tablet (300 mg total) by mouth 2 (two) times daily. 60 tablet 1   traZODone (DESYREL) 50 MG tablet Take 1 tablet (50 mg total) by mouth at bedtime as needed for sleep. 30 tablet 3   Current Facility-Administered Medications  Medication Dose Route Frequency Provider Last Rate Last Admin   haloperidol decanoate (HALDOL DECANOATE) 100 MG/ML injection 100 mg  100 mg Intramuscular Once Eulis Canner E, NP         Musculoskeletal: Strength & Muscle Tone: within normal limits Gait & Station: normal Patient leans: N/A  Psychiatric Specialty Exam: Review of Systems  Blood pressure 118/76, pulse 83, height _0  (1.702 m), weight (!) 303 lb (137.4 kg).Body mass index is 47.46 kg/m.  General Appearance: Well Groomed  Eye Contact:  Good  Speech:  Clear  and Coherent and Normal Rate  Volume:  Normal  Mood:  Anxious and Depressed  Affect:  Appropriate and Congruent  Thought Process:  Coherent, Goal Directed, and Linear  Orientation:  Full (Time, Place, and Person)  Thought Content: WDL and Logical   Suicidal Thoughts:  No  Homicidal Thoughts:  No  Memory:  Immediate;   Good Recent;   Good Remote;   Good  Judgement:  Good  Insight:  Good  Psychomotor Activity:  Normal  Concentration:  Concentration: Good and Attention Span: Good  Recall:  Good  Fund of Knowledge: Good  Language: Good  Akathisia:  No  Handed:  Right  AIMS (if indicated): not done  Assets:  Communication Skills Desire for Improvement Housing Leisure Time Physical Health Social Support  ADL's:  Intact  Cognition: WNL  Sleep:  Fair   Screenings: AUDIT    Flowsheet Row Admission (Discharged) from 04/04/2020 in [facility]  Alcohol Use Disorder Identification Test Final Score (AUDIT) 0      GAD-7    Flowsheet Row Office Visit from 07/28/2022 in [facility] Counselor from 12/03/2021 in [facility] Office Visit from 04/24/2021 in [place] Office Visit from 12/30/2020 in [place] Office Visit from 09/12/2018 in [place]  Total GAD-7 Score _1 0 15      PHQ2-9    [place] Office Visit from 07/28/2022 in [facility] Office Visit from 12/04/2021 in [facility] Counselor from 12/03/2021 in [facility] Office Visit from 04/24/2021 in [place] Visit from 12/30/2020 in [place]  PHQ-2 Total Score _2 0 0  PHQ-9 Total Score _3 -- --      [place] Office Visit from 07/28/2022 in [facility] ED  from 07/20/2022 in [place] ED from 02/10/2022 in [place] No Risk No Risk Moderate Risk  Assessment and Plan: Patient endorses symptoms of anxiety, depression, and insomnia.  Today she is agreeable to starting hydroxyzine 10 mg 3 times daily as needed to help manage with anxiety.  She will also start trazodone 50 mg nightly as needed to help manage sleep.  She will continue her other medication as prescribed.  1. Schizoaffective disorder, bipolar type (HCC)  Continue- haloperidol decanoate (HALDOL DECANOATE) 100 MG/ML injection 100 mg Continue- haloperidol decanoate (HALDOL DECANOATE) 100 MG/ML injection; Inject 1 mL (100 mg total) into the muscle every 28 (twenty-eight) days.  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  Dispense: 30 tablet; Refill: 3  3. Generalized anxiety disorder  Start- traZODone (DESYREL) 50 MG tablet; Take 1 tablet (50 mg total) by mouth at bedtime as needed for sleep.  Dispense: 30 tablet; Refill: 3 Start- hydrOXYzine (ATARAX) 10 MG tablet; Take 1 tablet (10 mg total) by mouth 3 (three) times daily as needed.  Dispense: 90 tablet; Refill: 3 Continue- FLUoxetine (PROZAC) 20 MG capsule; Take 1 capsule (20 mg total) by mouth daily.  Dispense: 30 capsule; Refill: 2 - Ambulatory referral to Social Work    Collaboration of Care: Collaboration of Care: Other provider involved in patient's care AEB counselor  Patient/Guardian was advised Release of Information must be obtained prior to any record release in order to collaborate their care with an outside provider. Patient/Guardian was advised if they have not already done so to contact the registration department to sign all necessary forms in  order for [place] to release information regarding their care.   Consent: Patient/Guardian gives verbal consent for treatment and assignment of benefits for services provided during this visit. Patient/Guardian expressed understanding and agreed to proceed.   Follow-up with shot clinic in 1 month Follow-up with provider in 3 months Follow-up with therapy Salley Slaughter, NP 07/28/2022, 8:53 AM

## 2022-08-02 ENCOUNTER — Encounter (HOSPITAL_COMMUNITY): Payer: Self-pay | Admitting: Emergency Medicine

## 2022-08-02 ENCOUNTER — Other Ambulatory Visit: Payer: Self-pay

## 2022-08-02 ENCOUNTER — Emergency Department (HOSPITAL_COMMUNITY)
Admission: EM | Admit: 2022-08-02 | Discharge: 2022-08-04 | Disposition: A | Discharge status: Other Acute Inpatient Hospital | Payer: Medicaid Other | Attending: Emergency Medicine | Admitting: Emergency Medicine

## 2022-08-02 DIAGNOSIS — Z20822 Contact with and (suspected) exposure to covid-19: Secondary | ICD-10-CM | POA: Diagnosis not present

## 2022-08-02 DIAGNOSIS — Z7984 Long term (current) use of oral hypoglycemic drugs: Secondary | ICD-10-CM | POA: Insufficient documentation

## 2022-08-02 DIAGNOSIS — D72829 Elevated white blood cell count, unspecified: Secondary | ICD-10-CM | POA: Diagnosis not present

## 2022-08-02 DIAGNOSIS — R0602 Shortness of breath: Secondary | ICD-10-CM | POA: Insufficient documentation

## 2022-08-02 DIAGNOSIS — Z794 Long term (current) use of insulin: Secondary | ICD-10-CM | POA: Diagnosis not present

## 2022-08-02 DIAGNOSIS — R079 Chest pain, unspecified: Secondary | ICD-10-CM | POA: Diagnosis not present

## 2022-08-02 DIAGNOSIS — R45851 Suicidal ideations: Secondary | ICD-10-CM | POA: Diagnosis present

## 2022-08-02 LAB — COMPREHENSIVE METABOLIC PANEL
ALT: 19 U/L (ref 0–44)
AST: 16 U/L (ref 15–41)
Albumin: 3.4 g/dL — ABNORMAL LOW (ref 3.5–5.0)
Alkaline Phosphatase: 55 U/L (ref 38–126)
Anion gap: 5 (ref 5–15)
BUN: 11 mg/dL (ref 6–20)
CO2: 24 mmol/L (ref 22–32)
Calcium: 8.8 mg/dL — ABNORMAL LOW (ref 8.9–10.3)
Chloride: 102 mmol/L (ref 98–111)
Creatinine, Ser: 0.91 mg/dL (ref 0.44–1.00)
GFR, Estimated: >60 mL/min (ref >60)
Glucose, Bld: 290 mg/dL — ABNORMAL HIGH (ref 70–99)
Potassium: 3.6 mmol/L (ref 3.5–5.1)
Sodium: 131 mmol/L — ABNORMAL LOW (ref 135–145)
Total Bilirubin: 0.5 mg/dL (ref 0.3–1.2)
Total Protein: 7.2 g/dL (ref 6.5–8.1)

## 2022-08-02 LAB — CBG MONITORING, ED
Glucose-Capillary: 220 mg/dL — ABNORMAL HIGH (ref 70–99)
Glucose-Capillary: 212 mg/dL — ABNORMAL HIGH (ref 70–99)

## 2022-08-02 LAB — RESP PANEL BY RT-PCR (FLU A&B, COVID) ARPGX2
Influenza A by PCR: NEGATIVE
Influenza B by PCR: NEGATIVE
SARS Coronavirus 2 by RT PCR: NEGATIVE

## 2022-08-02 LAB — CBC
HCT: 43.7 % (ref 36.0–46.0)
Hemoglobin: 14.7 g/dL (ref 12.0–15.0)
MCH: 28.2 pg (ref 26.0–34.0)
MCHC: 33.6 g/dL (ref 30.0–36.0)
MCV: 83.9 fL (ref 80.0–100.0)
Platelets: 313 10*3/uL (ref 150–400)
RBC: 5.21 MIL/uL — ABNORMAL HIGH (ref 3.87–5.11)
RDW: 13.2 % (ref 11.5–15.5)
WBC: 10.7 10*3/uL — ABNORMAL HIGH (ref 4.0–10.5)
nRBC: 0 % (ref 0.0–0.2)

## 2022-08-02 LAB — I-STAT BETA HCG BLOOD, ED (MC, WL, AP ONLY): I-stat hCG, quantitative: <5 m[IU]/mL (ref <5)

## 2022-08-02 LAB — RAPID URINE DRUG SCREEN, HOSP PERFORMED
Amphetamines: NOT DETECTED
Barbiturates: NOT DETECTED
Benzodiazepines: NOT DETECTED
Cocaine: NOT DETECTED
Opiates: NOT DETECTED
Tetrahydrocannabinol: POSITIVE — AB

## 2022-08-02 LAB — SALICYLATE LEVEL: Salicylate Lvl: <7 mg/dL — ABNORMAL LOW (ref 7.0–30.0)

## 2022-08-02 LAB — ACETAMINOPHEN LEVEL: Acetaminophen (Tylenol), Serum: <10 ug/mL — ABNORMAL LOW (ref 10–30)

## 2022-08-02 LAB — ETHANOL: Alcohol, Ethyl (B): <10 mg/dL (ref <10)

## 2022-08-02 MED ORDER — HYDROXYZINE HCL 10 MG PO TABS: 10.0000 mg | ORAL_TABLET | Freq: Three times a day (TID) | ORAL | Status: DC | PRN

## 2022-08-02 MED ORDER — INSULIN ASPART 100 UNIT/ML IJ SOLN
0.0000 [IU] | Freq: Every day | INTRAMUSCULAR | Status: DC
  Administered 2022-08-02: 2 [IU] via SUBCUTANEOUS

## 2022-08-02 MED ORDER — TRAZODONE HCL 50 MG PO TABS
50.0000 mg | ORAL_TABLET | Freq: Every evening | ORAL | Status: DC | PRN
  Filled 2022-08-02: qty 1

## 2022-08-02 MED ORDER — INSULIN ASPART 100 UNIT/ML IJ SOLN
0.0000 [IU] | Freq: Three times a day (TID) | INTRAMUSCULAR | Status: DC
  Administered 2022-08-02: 5 [IU] via SUBCUTANEOUS
  Administered 2022-08-03: 3 [IU] via SUBCUTANEOUS
  Administered 2022-08-03: 2 [IU] via SUBCUTANEOUS
  Administered 2022-08-03: 3 [IU] via SUBCUTANEOUS

## 2022-08-02 MED ORDER — FLUOXETINE HCL 20 MG PO CAPS
20.0000 mg | ORAL_CAPSULE | Freq: Every day | ORAL | Status: DC
  Administered 2022-08-02 – 2022-08-03 (×2): 20 mg via ORAL
  Filled 2022-08-02 (×2): qty 1

## 2022-08-02 MED ORDER — METFORMIN HCL 500 MG PO TABS
1000.0000 mg | ORAL_TABLET | Freq: Two times a day (BID) | ORAL | Status: DC
  Administered 2022-08-02 – 2022-08-03 (×3): 1000 mg via ORAL
  Filled 2022-08-02 (×3): qty 2

## 2022-08-02 NOTE — Consult Note (Signed)
[place] ED ASSESSMENT   Reason for Consult:  SI Referring Physician:  Deatra Canter, [place] Patient Identification: Cindy Leonard MRN:  [id number] ED Chief Complaint: Suicidal ideation  Diagnosis:  Principal Problem:   Suicidal ideation   ED Assessment Time Calculation: Start Time: 106 Stop Time: 1700 Total Time in Minutes (Assessment Completion): 30   HPI:   Cindy Leonard is a 26 y.o. female patient who voluntarily presents to [facility] ED for evaluation of suicidal ideations.  Reports increased suicidal ideations over the past week.  Patient does have a plan to cut her wrist.  Patient had suicide attempt 1 year ago where she cut her wrist and received inpatient treatment at [facility].  Subjective:   Patient seen at [facility] emergency department for face-to-face evaluation.  She currently lives with her mother, she reports her relationship with her mom is okay but she does not really understand her mental illness.  Patient tells me she has previous history of bipolar disorder and major depression.  She currently receives outpatient medication management and therapy at behavioral urgent care.  She does receive Haldol decanoate, last injection was on September 28.  She does feel like since switching from the Haldol pills to the decanoate she has felt more depressed.  She tells me she only switched to the long-acting injection because she was tired of taking pills, however she is hoping to go back on pills when it is time for her next shot.  She tells me she is compliant with all of her medications.  She does endorse marijuana use.  She denies any other illicit substance use or alcohol use.  She tells me she has been sober from cocaine and alcohol for 1 month.  Because of her sobriety she has lost a lot of friends and feels like no one will talk to her anymore since she does not want to party or get high.  Cocaine and alcohol were a coping mechanism for her so now that she does not have that or any supportive  friendships she believes that is where her suicidal ideations are coming from.  She is currently unemployed, she was fired from [employer] around 3 months ago.  She tells me she has been feeling very isolated and feels very confused about her purpose in life. Patient stated "what's the point in being here. I have no friends, I'm not getting drunk or high, I don't have a job. Im just depressed. I just want to die I don't see the point in carrying on."  Patient continues to endorse active suicidal ideations with a plan to cut her wrist.  She does endorse 1 previous suicide attempt 1 year ago, she did cut her right wrist and received inpatient treatment at [facility].  She denies any homicidal ideations.  She denies any auditory or visual hallucinations.  She tells me she has not been sleeping well at night only averaging a couple hours per night.  She endorses decreased appetite.  She endorses feelings of low self-esteem, hopelessness, lack of motivation, and lack of energy.  Patient is able to engage in coherent and logical conversation.  Speech is normal in rate and tone.  Does not appear to be responding to internal stimuli, no concerns for psychosis at this time.  Does endorse poor sleep and appetite.  There are currently no concerns for current manic episode.  She is unable to contract for safety at this time.  Would recommend to continue her current home medication  regimen.  Patient does not meet criteria for [facility] admission due to suicidal ideations with specific plan. Will recommend for inpatient psychiatric treatment.  Past Psychiatric History:  Previous history of polysubstance abuse, bipolar disorder, MDD, and schizophrenia.  Previous suicide attempt 1 year ago with [facility] admission.  Risk to Self or Others: Is the patient at risk to self? Yes Has the patient been a risk to self in the past 6 months? No Has the patient been a risk to self within the distant past? Yes Is the patient a risk to  others? No Has the patient been a risk to others in the past 6 months? No Has the patient been a risk to others within the distant past? No  [place] Scale:  [place] ED from 08/02/2022 in [place] Office Visit from 07/28/2022 in [facility] ED from 07/20/2022 in [facility] High Risk No Risk No Risk       Substance Abuse:   Current THC use  Past Medical History:  Past Medical History:  Diagnosis Date   Bipolar 1 disorder ([place])    Depression    Diabetes mellitus without complication ([place])    Schizophrenia ([place])     Past Surgical History:  Procedure Laterality Date   INCISION AND DRAINAGE ABSCESS Left 12/08/2020   Procedure: INCISION AND DRAINAGE POSTERIOR NECK ABSCESS;  Surgeon: [place] Pickerel, MD;  Location: [facility];  Service: General;  Laterality: Left;   NO PAST SURGERIES     Family History:  Family History  Problem Relation Age of Onset   Stroke Mother    Diabetes Other    Hypertension Other    Asthma Father    Asthma Brother    Social History:  Social History   Substance and Sexual Activity  Alcohol Use No     Social History   Substance and Sexual Activity  Drug Use Yes   Types: Marijuana   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   Sexual activity: Yes    Birth control/protection: None   Other Topics Concern   Not on file  Social History Narrative   Not on file   Social Determinants of Health   Financial Resource Strain: Not on file  Food Insecurity: Food Insecurity Present (10/07/2018)   Hunger Vital Sign    Worried About Running Out of Food in the Last Year: Sometimes true    Ran Out of Food in the Last Year: Sometimes true  Transportation Needs: Not on file  Physical Activity: Not on file  Stress: Not on file  Social Connections: Not on file   Additional Social History:    Allergies:   Allergies  Allergen Reactions   Blueberry [  Vaccinium Angustifolium] Anaphylaxis    Labs:  Results for orders placed or performed during the hospital encounter of 08/02/22 (from the past 48 hour(s))  Comprehensive metabolic panel     Status: Abnormal   Collection Time: 08/02/22  1:02 PM  Result Value Ref Range   Sodium 131 (L) 135 - 145 mmol/L   Potassium 3.6 3.5 - 5.1 mmol/L   Chloride 102 98 - 111 mmol/L   CO2 24 22 - 32 mmol/L   Glucose, Bld 290 (H) 70 - 99 mg/dL    Comment: Glucose reference range applies only to samples taken after fasting for at least 8 hours.   BUN 11 6 - 20 mg/dL   Creatinine, Ser 0.91 0.44 - 1.00 mg/dL   Calcium 8.8 (L) 8.9 - 10.3 mg/dL   Total Protein 7.2 6.5 - 8.1 g/dL   Albumin 3.4 (L) 3.5 - 5.0 g/dL   AST 16 15 - 41 U/L   ALT 19 0 - 44 U/L   Alkaline Phosphatase 55 38 - 126 U/L   Total Bilirubin 0.5 0.3 - 1.2 mg/dL   GFR, Estimated >60 >60 mL/min    Comment: (NOTE) Calculated using the CKD-EPI Creatinine Equation (2021)    Anion gap 5 5 - 15    Comment: Performed at [facility], [place] [address] [place], [place] [address]  Ethanol     Status: None   Collection Time: 08/02/22  1:02 PM  Result Value Ref Range   Alcohol, Ethyl (B) <10 <10 mg/dL    Comment: (NOTE) Lowest detectable limit for serum alcohol is 10 mg/dL.  For medical purposes only. Performed at [facility], [place] [address] [place], [place] [address]    Salicylate level     Status: Abnormal   Collection Time: 08/02/22  1:02 PM  Result Value Ref Range   Salicylate Lvl <2.2 (L) 7.0 - 30.0 mg/dL    Comment: Performed at [facility] [address] [place], [place] [address]  Acetaminophen level     Status: Abnormal   Collection Time: 08/02/22  1:02 PM  Result Value Ref Range   Acetaminophen (Tylenol), Serum <10 (L) 10 - 30 ug/mL    Comment: (NOTE) Therapeutic concentrations vary significantly. A range of 10-30 ug/mL  may be an effective concentration for many patients. However, some  are best treated at concentrations outside of this range. Acetaminophen concentrations >150 ug/mL at 4 hours after ingestion  and >50 ug/mL at 12 hours after ingestion are often associated with  toxic reactions.  Performed at [facility], [place] [address], [place], [place] [address]   cbc     Status: Abnormal   Collection Time: 08/02/22  1:02 PM  Result Value Ref Range   WBC 10.7 (H) 4.0 - 10.5 K/uL   RBC 5.21 (H) 3.87 - 5.11 MIL/uL   Hemoglobin 14.7 12.0 - 15.0 g/dL   HCT 43.7 36.0 - 46.0 %   MCV 83.9 80.0 - 100.0 fL   MCH 28.2 26.0 - 34.0 pg   MCHC 33.6 30.0 - 36.0 g/dL   RDW 13.2 11.5 - 15.5 %   Platelets 313 150 - 400 K/uL   nRBC 0.0 0.0 - 0.2 %    Comment: Performed at [facility], [place] [address] [place], [place] [address]  Rapid urine drug screen (hospital performed)     Status: Abnormal   Collection Time: 08/02/22  1:02 PM  Result Value Ref Range  Opiates NONE DETECTED NONE DETECTED   Cocaine NONE DETECTED NONE DETECTED   Benzodiazepines NONE DETECTED NONE DETECTED   Amphetamines NONE DETECTED NONE DETECTED   Tetrahydrocannabinol POSITIVE (A) NONE DETECTED   Barbiturates NONE DETECTED NONE DETECTED    Comment: (NOTE) DRUG SCREEN FOR MEDICAL PURPOSES ONLY.  IF CONFIRMATION IS NEEDED FOR ANY PURPOSE, NOTIFY LAB WITHIN 5 DAYS.  LOWEST DETECTABLE LIMITS FOR URINE DRUG SCREEN Drug Class                     Cutoff  (ng/mL) Amphetamine and metabolites    1000 Barbiturate and metabolites    200 Benzodiazepine                 161 Tricyclics and metabolites     300 Opiates and metabolites        300 Cocaine and metabolites        300 THC                            50 Performed at [facility], [place] [address], [place], [place] [address]   I-Stat beta hCG blood, ED     Status: None   Collection Time: 08/02/22  1:39 PM  Result Value Ref Range   I-stat hCG, quantitative <5.0 <5 mIU/mL   Comment 3            Comment:   GEST. AGE      CONC.  (mIU/mL)   <=1 WEEK        5 - 50     2 WEEKS       50 - 500     3 WEEKS       100 - 10,000     4 WEEKS     1,000 - 30,000        FEMALE AND NON-PREGNANT FEMALE:     LESS THAN 5 mIU/mL   CBG monitoring, ED     Status: Abnormal   Collection Time: 08/02/22  4:25 PM  Result Value Ref Range   Glucose-Capillary 220 (H) 70 - 99 mg/dL    Comment: Glucose reference range applies only to samples taken after fasting for at least 8 hours.    Current Facility-Administered Medications  Medication Dose Route Frequency Provider Last Rate Last Admin   FLUoxetine (PROZAC) capsule 20 mg  20 mg Oral Daily Deatra Canter, Amjad, PA-C   20 mg at 08/02/22 1633   haloperidol decanoate (HALDOL DECANOATE) 100 MG/ML injection 100 mg  100 mg Intramuscular Once Eulis Canner E, NP       hydrOXYzine (ATARAX) tablet 10 mg  10 mg Oral TID PRN Evlyn Courier, PA-C       insulin aspart (novoLOG) injection 0-15 Units  0-15 Units Subcutaneous TID WC Evlyn Courier, PA-C   5 Units at 08/02/22 1634   insulin aspart (novoLOG) injection 0-5 Units  0-5 Units Subcutaneous QHS Deatra Canter, Amjad, PA-C       metFORMIN (GLUCOPHAGE) tablet 1,000 mg  1,000 mg Oral BID WC Deatra Canter, Amjad, PA-C   1,000 mg at 08/02/22 1633   traZODone (DESYREL) tablet 50 mg  50 mg Oral QHS PRN Evlyn Courier, PA-C       Current Outpatient Medications  Medication Sig Dispense Refill   FLUoxetine (PROZAC) 20 MG capsule Take 1 capsule (20 mg total) by  mouth daily. 30 capsule 2   haloperidol decanoate (HALDOL DECANOATE) 100 MG/ML injection Inject  1 mL (100 mg total) into the muscle every 28 (twenty-eight) days. 1 mL 11   hydrOXYzine (ATARAX) 10 MG tablet Take 1 tablet (10 mg total) by mouth 3 (three) times daily as needed. (Patient taking differently: Take 10 mg by mouth 3 (three) times daily as needed for anxiety.) 90 tablet 3   insulin glargine (LANTUS) 100 UNIT/ML Solostar Pen Inject 60 Units into the skin daily. 15 mL 0   Oxcarbazepine (TRILEPTAL) 300 MG tablet Take 1 tablet (300 mg total) by mouth 2 (two) times daily. 60 tablet 1   traZODone (DESYREL) 50 MG tablet Take 1 tablet (50 mg total) by mouth at bedtime as needed for sleep. 30 tablet 3   Blood Glucose Monitoring Suppl (ACCU-CHEK AVIVA PLUS) w/Device KIT Use as directed 1 kit 0   cephALEXin (KEFLEX) 500 MG capsule Take 1 capsule (500 mg total) by mouth 4 (four) times daily. (Patient not taking: Reported on 08/02/2022) 28 capsule 0   glucose blood (ACCU-CHEK AVIVA PLUS) test strip Use as instructed 100 each 12   Insulin Pen Needle 31G X 8 MM MISC Use as directed 100 each 6   Lancets (ACCU-CHEK SOFT TOUCH) lancets Use as instructed 100 each 12   metFORMIN (GLUCOPHAGE) 500 MG tablet Take 2 tablets (1,000 mg total) by mouth 2 (two) times daily with a meal. (Patient taking differently: Take 1,000 mg by mouth daily as needed (high blood sugar).) 120 tablet 6   Psychiatric Specialty Exam: Presentation  General Appearance:  Appropriate for Environment  Eye Contact: Good  Speech: Clear and Coherent  Speech Volume: Normal  Handedness: Ambidextrous   Mood and Affect  Mood: Depressed  Affect: Congruent   Thought Process  Thought Processes: Coherent  Descriptions of Associations:Intact  Orientation:Full (Time, Place and Person)  Thought Content:Logical  History of Schizophrenia/Schizoaffective disorder:Yes  Duration of Psychotic Symptoms:No data  recorded Hallucinations:Hallucinations: None  Ideas of Reference:None  Suicidal Thoughts:Suicidal Thoughts: Yes, Active SI Active Intent and/or Plan: With Intent; With Plan  Homicidal Thoughts:Homicidal Thoughts: No   Sensorium  Memory: Immediate Good; Recent Good  Judgment: Fair  Insight: Fair   Executive Functions  Concentration: Good  Attention Span: Good  Recall: Good  Fund of Knowledge: Good  Language: Good   Psychomotor Activity  Psychomotor Activity: Psychomotor Activity: Normal   Assets  Assets: Communication Skills; Physical Health; Resilience; Social Support    Sleep  Sleep: Sleep: Fair   Physical Exam: Physical Exam Neurological:     Mental Status: She is alert and oriented to person, place, and time.  Psychiatric:        Attention and Perception: Attention normal.        Mood and Affect: Mood is depressed.        Speech: Speech normal.        Behavior: Behavior is cooperative.        Thought Content: Thought content includes suicidal ideation.        Cognition and Memory: Cognition normal.    Review of Systems  Psychiatric/Behavioral:  Positive for depression, substance abuse and suicidal ideas. The patient is nervous/anxious and has insomnia.   All other systems reviewed and are negative.  Blood pressure 93/77, pulse 79, temperature 98.4 F (36.9 C), temperature source Oral, resp. rate 16, SpO2 98 %. There is no height or weight on file to calculate BMI.  Medical Decision Making: Patient case reviewed and discussed with Dr. Dwyane Dee.  Patient continues to meet criteria for IVC and inpatient psychiatric treatment.  She continues to endorse active suicidal ideations with plan and intent to cut her wrist.  She is unable to contract for safety at this time.  Will recommend for inpatient psychiatric admission.  EDP, RN, and LCSW notified of disposition.  CSW to fax patient out if there is no bed availability at [facility].  - Continue home  medications  Disposition: Recommend psychiatric Inpatient admission when medically cleared.  Vesta Mixer, NP 08/02/2022 4:50 PM

## 2022-08-02 NOTE — Progress Notes (Signed)
Per Vesta Mixer, NP, patient meets criteria for inpatient treatment. There are no available or appropriate beds at [facility] today. CSW faxed referrals to the following facilities for review:  [address] [place] [address] [phone] --  [facility] [address], [place] [address] [phone] --  [address] [place] [address] [phone] --  [place]  Pending - Request Sent N/A [place], [place] [address] [phone] --  [place] [address] [place], [place] [address] [phone] --  [address] [place] [address] [phone] --  [facility]  Pending - Request Sent N/A [address] [place] [address] [phone] --  [facility]  Pending - Request Sent N/A [place] [address], [place] [address] [phone] --  [facility]  Pending - Request Sent N/A [address] [place] [address] [phone] --  [place] N/A [address], [place] [address] [phone] --  [facility]  Pending - Request Sent N/A [place], [place] [address] [phone] --  [place] Sent N/A [address] [place] [address] [phone] --  [facility] N/A [address], [place] [address] [phone] --  [facility]  Pending - Request  Sent N/A [address] [place] [address] [phone] --   TTS will continue to seek bed placement.  [place], MSW, [place] Phone: [phone] Disposition/TOC

## 2022-08-02 NOTE — ED Notes (Signed)
3 IVC paperwork put on clipboard in purple zone and one copy in the red folder

## 2022-08-02 NOTE — ED Provider Notes (Signed)
[facility] EMERGENCY DEPARTMENT Provider Note   CSN: [id number] Arrival date & time: 08/02/22  1219     History  Chief Complaint  Patient presents with   Suicidal    Cindy Leonard is a 26 y.o. female.  26 year old female with past medical history of bipolar, schizophrenia presents today for evaluation of suicidal ideations.  She states she has been having these thoughts for the past week however they are progressively worsening.  She states she has a plan to cut her wrist.  She does have attempt of suicide about 1 year ago.  She is on Haldol injection.  States this is not working.  Denies alcohol use or other illicit substance abuse.  She is without any complaint such as chest pain, shortness of breath.  The history is provided by the patient. No language interpreter was used.       Home Medications Prior to Admission medications   Medication Sig Start Date End Date Taking? Authorizing Provider  Blood Glucose Monitoring Suppl (ACCU-CHEK AVIVA PLUS) w/Device KIT Use as directed 08/28/21   Ladell Pier, MD  Carboxymethylcellulose Sodium (DRY EYE RELIEF OP) Place 2 drops into both eyes daily as needed (For dry eyes).    [provider]  cephALEXin (KEFLEX) 500 MG capsule Take 1 capsule (500 mg total) by mouth 4 (four) times daily. 07/21/22   Dorothyann Peng, PA-C  FLUoxetine (PROZAC) 20 MG capsule Take 1 capsule (20 mg total) by mouth daily. 07/28/22   Eulis Canner E, NP  glucose blood (ACCU-CHEK AVIVA PLUS) test strip Use as instructed 04/08/20   Clapacs, Madie Reno, MD  haloperidol decanoate (HALDOL DECANOATE) 100 MG/ML injection Inject 1 mL (100 mg total) into the muscle every 28 (twenty-eight) days. 07/28/22   Salley Slaughter, NP  hydrOXYzine (ATARAX) 10 MG tablet Take 1 tablet (10 mg total) by mouth 3 (three) times daily as needed. 07/28/22   Eulis Canner E, NP  insulin glargine (LANTUS) 100 UNIT/ML Solostar Pen Inject 60 Units into the  skin daily. 04/02/22   Ladell Pier, MD  Insulin Pen Needle 31G X 8 MM MISC Use as directed 04/24/21   Ladell Pier, MD  Lancets (ACCU-CHEK SOFT [facility]) lancets Use as instructed 04/08/20   Clapacs, Madie Reno, MD  metFORMIN (GLUCOPHAGE) 500 MG tablet Take 2 tablets (1,000 mg total) by mouth 2 (two) times daily with a meal. 04/24/21   Ladell Pier, MD  Oxcarbazepine (TRILEPTAL) 300 MG tablet Take 1 tablet (300 mg total) by mouth 2 (two) times daily. 12/04/21   Penn, Lunette Stands, NP  traZODone (DESYREL) 50 MG tablet Take 1 tablet (50 mg total) by mouth at bedtime as needed for sleep. 07/28/22   Salley Slaughter, NP      Allergies    Blueberry [vaccinium angustifolium]    Review of Systems   Review of Systems  Constitutional:  Negative for fever.  Respiratory:  Negative for shortness of breath.   Cardiovascular:  Negative for chest pain.  Psychiatric/Behavioral:  Positive for suicidal ideas. Negative for agitation and self-injury.   All other systems reviewed and are negative.   Physical Exam Updated Vital Signs BP 93/77 (BP Location: Right Arm)   Pulse 79   Temp 98.4 F (36.9 C) (Oral)   Resp 16   SpO2 98%  Physical Exam Vitals and nursing note reviewed.  Constitutional:      General: She is not in acute distress.    Appearance: Normal  appearance. She is not ill-appearing.  HENT:     Head: Normocephalic and atraumatic.     Nose: Nose normal.  Eyes:     Conjunctiva/sclera: Conjunctivae normal.  Cardiovascular:     Rate and Rhythm: Normal rate and regular rhythm.  Pulmonary:     Effort: Pulmonary effort is normal. No respiratory distress.  Musculoskeletal:        General: No deformity.  Skin:    Findings: No rash.  Neurological:     Mental Status: She is alert.  Psychiatric:        Mood and Affect: Mood is depressed. Affect is blunt.        Behavior: Behavior normal. Behavior is cooperative.        Thought Content: Thought content includes suicidal ideation. Thought  content does not include homicidal ideation. Thought content includes suicidal plan. Thought content does not include homicidal plan.     ED Results / Procedures / Treatments   Labs (all labs ordered are listed, but only abnormal results are displayed) Labs Reviewed  COMPREHENSIVE METABOLIC PANEL - Abnormal; Notable for the following components:      Result Value   Sodium 131 (*)    Glucose, Bld 290 (*)    Calcium 8.8 (*)    Albumin 3.4 (*)    All other components within normal limits  SALICYLATE LEVEL - Abnormal; Notable for the following components:   Salicylate Lvl <9.3 (*)    All other components within normal limits  ACETAMINOPHEN LEVEL - Abnormal; Notable for the following components:   Acetaminophen (Tylenol), Serum <10 (*)    All other components within normal limits  CBC - Abnormal; Notable for the following components:   WBC 10.7 (*)    RBC 5.21 (*)    All other components within normal limits  RAPID URINE DRUG SCREEN, HOSP PERFORMED - Abnormal; Notable for the following components:   Tetrahydrocannabinol POSITIVE (*)    All other components within normal limits  ETHANOL  I-STAT BETA HCG BLOOD, ED ([facility], [facility], [facility] ONLY)    EKG None  Radiology No results found.  Procedures Procedures    Medications Ordered in ED Medications - No data to display  ED Course/ Medical Decision Making/ A&P                           Medical Decision Making Amount and/or Complexity of Data Reviewed Labs: ordered.  Risk Prescription drug management.   26 year old female with history of bipolar, schizophrenia presents today for evaluation of suicidal ideation with plan of suicide by cutting her wrist.  History of suicide attempt.  IVC paperwork complete.  Will obtain labs and an EKG.  CBC with mild leukocytosis at 10.7, without anemia.  Doubt infection as she is without signs or symptoms of infection.  CMP shows glucose of 290 otherwise no other acute finding.  No anion gap.  Doubt  DKA.  She is diabetic and reports compliance with her insulin.  UDS positive for THC.  Acetaminophen, ethanol, salicylate levels within normal limits.  EKG without acute ischemic changes or other concerns.  Patient has been medically cleared for psychiatric evaluation.  TTS consult ordered.  Home medications plus sliding scale insulin ordered.  Final Clinical Impression(s) / ED Diagnoses Final diagnoses:  Suicidal ideation    Rx / DC Orders ED Discharge Orders     None         Evlyn Courier, PA-C 08/02/22 1552  Fredia Sorrow, MD 08/11/22 1010

## 2022-08-02 NOTE — ED Notes (Signed)
IVC . Paperwork is in blue zone

## 2022-08-02 NOTE — ED Provider Triage Note (Addendum)
Emergency Medicine Provider Triage Evaluation Note  Cindy Leonard , a 26 y.o. female  was evaluated in triage.  Pt complains of suicidal ideation.  Has history of bipolar and schizophrenia.  Is on Haldol injection.  She is also diabetic.  Reports compliance with insulin.  States she plans to cut herself.  Denies HI, or AVH.  Reports previous suicide attempt.  Denies current alcohol use.  Review of Systems  Positive: As above Negative: As above  Physical Exam  BP 93/77 (BP Location: Right Arm)   Pulse 79   Temp 98.4 F (36.9 C) (Oral)   Resp 16   SpO2 98%  Gen:   Awake, no distress   Resp:  Normal effort  MSK:   Moves extremities without difficulty  Other:    Medical Decision Making  Medically screening exam initiated at 1:08 PM.  Appropriate orders placed.  Cindy Leonard was informed that the remainder of the evaluation will be completed by another provider, this initial triage assessment does not replace that evaluation, and the importance of remaining in the ED until their evaluation is complete.  IVC paperwork completed given psychiatric history, past history of SI attempt.   Evlyn Courier, PA-C 08/02/22 1309    Evlyn Courier, PA-C 08/02/22 1324

## 2022-08-02 NOTE — ED Triage Notes (Signed)
Patient here with complaint of suicidal ideation, states "I don't wanna be here no more." Patient reports thoughts about cutting herself. Patient is alert, oriented, calm, cooperative, and in no apparent distress at this time.

## 2022-08-03 DIAGNOSIS — R45851 Suicidal ideations: Secondary | ICD-10-CM

## 2022-08-03 LAB — CBG MONITORING, ED
Glucose-Capillary: 190 mg/dL — ABNORMAL HIGH (ref 70–99)
Glucose-Capillary: 128 mg/dL — ABNORMAL HIGH (ref 70–99)
Glucose-Capillary: 169 mg/dL — ABNORMAL HIGH (ref 70–99)
Glucose-Capillary: 137 mg/dL — ABNORMAL HIGH (ref 70–99)

## 2022-08-03 MED ORDER — ALUM & MAG HYDROXIDE-SIMETH 200-200-20 MG/5ML PO SUSP
30.0000 mL | Freq: Once | ORAL | Status: AC
  Administered 2022-08-03: 30 mL via ORAL
  Filled 2022-08-03: qty 30

## 2022-08-03 MED ORDER — LIDOCAINE VISCOUS HCL 2 % MT SOLN
15.0000 mL | Freq: Once | OROMUCOSAL | Status: AC
  Administered 2022-08-03: 15 mL via ORAL
  Filled 2022-08-03: qty 15

## 2022-08-03 NOTE — ED Notes (Signed)
Pt resting with eyes closed; respirations spontaneous, even, unlabored; sitter at bedside  

## 2022-08-03 NOTE — ED Provider Notes (Signed)
Emergency Medicine Observation Re-evaluation Note  Cindy Leonard is a 26 y.o. female with a history of bipolar and schizophrenia seen on rounds today.  Pt initially presented to the ED for complaints of Suicidal Currently, the patient is doing well.  Physical Exam  BP 116/75   Pulse 68   Temp 98.6 F (37 C)   Resp 16   SpO2 99%  Physical Exam General: sleeping comfortably in stretcher Lungs: CTAB in NAD Psych: calm  ED Course / MDM  EKG:EKG Interpretation  Date/Time:  Sunday August 02 2022 14:41:41 EDT Ventricular Rate:  84 PR Interval:  150 QRS Duration: 86 QT Interval:  382 QTC Calculation: 451 R Axis:   91 Text Interpretation: Normal sinus rhythm Rightward axis Cannot rule out Anterior infarct , age undetermined Abnormal ECG When compared with ECG of 20-Jul-2022 18:12, No significant change was found Confirmed by Delora Fuel ([id number]) on 08/02/2022 11:32:23 PM  I have reviewed the labs performed to date as well as medications administered while in observation.  Recent changes in the last 24 hours include seen by inpatient psychiatry who recommends inpatient psychiatric admission and continuation of her home medications.  Plan  Current plan is for placement.    Fransico Meadow, MD 08/03/22 [phone]

## 2022-08-03 NOTE — Progress Notes (Signed)
[facility] Psych ED Progress Note  08/03/2022 10:04 AM Cindy Leonard  MRN:  [id number]   Subjective:   Patient seen this morning at [facility], ED for face-to-face reevaluation.  She is sleeping but easy to wake up.  She tells me she slept pretty well last night, it is the best sleep she has gotten in around a week.  She is still feeling very tired and wishes to continue sleeping this morning.  She continues to endorse feelings of hopelessness, low self-esteem, sadness, isolation, and suicidal ideations.  She continues to endorse plan wanting to cut her wrist.  She is unable to identify reasons to live at this moment.  She is unable to contract for safety.  She denies any homicidal ideations.  Denies any auditory or visual hallucinations.  Will continue to recommend inpatient psychiatric treatment.  Principal Problem: Suicidal ideation Diagnosis:  Principal Problem:   Suicidal ideation   ED Assessment Time Calculation: Start Time: 0900 Stop Time: 0920 Total Time in Minutes (Assessment Completion): 20   Past Psychiatric History:  See previous documentation  [place] Scale:  [place] ED from 08/02/2022 in [place] Office Visit from 07/28/2022 in [facility] ED from 07/20/2022 in [place] CATEGORY High Risk No Risk No Risk       Past Medical History:  Past Medical History:  Diagnosis Date   Bipolar 1 disorder ([place])    Depression    Diabetes mellitus without complication ([place])    Schizophrenia ([place])     Past Surgical History:  Procedure Laterality Date   INCISION AND DRAINAGE ABSCESS Left 12/08/2020   Procedure: INCISION AND DRAINAGE POSTERIOR NECK ABSCESS;  Surgeon: Greer Pickerel, MD;  Location: [facility];  Service: General;  Laterality: Left;   NO PAST SURGERIES     Family History:  Family History  Problem Relation Age of Onset   Stroke Mother    Diabetes Other     Hypertension Other    Asthma Father    Asthma Brother    Social History:  Social History   Substance and Sexual Activity  Alcohol Use No     Social History   Substance and Sexual Activity  Drug Use Yes   Types: Marijuana   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   Sexual activity: Yes    Birth control/protection: None  Other Topics Concern   Not on file  Social History Narrative   Not on file   Social Determinants of Health   Financial Resource Strain: Not on file  Food Insecurity: Food Insecurity Present (10/07/2018)   Hunger Vital Sign    Worried About Running Out of Food in the Last Year: Sometimes true  Ran Out of Food in the Last Year: Sometimes true  Transportation Needs: Not on file  Physical Activity: Not on file  Stress: Not on file  Social Connections: Not on file    Sleep: Good  Appetite:  Good  Current Medications: Current Facility-Administered Medications  Medication Dose Route Frequency Provider Last Rate Last Admin   FLUoxetine (PROZAC) capsule 20 mg  20 mg Oral Daily Deatra Canter, Amjad, PA-C   20 mg at 08/03/22 7371   haloperidol decanoate (HALDOL DECANOATE) 100 MG/ML injection 100 mg  100 mg Intramuscular Once Eulis Canner E, NP       hydrOXYzine (ATARAX) tablet 10 mg  10 mg Oral TID PRN Deatra Canter, Amjad, PA-C       insulin aspart (novoLOG) injection 0-15 Units  0-15 Units Subcutaneous TID WC Evlyn Courier,  PA-C   3 Units at 08/03/22 0827   insulin aspart (novoLOG) injection 0-5 Units  0-5 Units Subcutaneous QHS Evlyn Courier, PA-C   2 Units at 08/02/22 2120   metFORMIN (GLUCOPHAGE) tablet 1,000 mg  1,000 mg Oral BID WC Evlyn Courier, PA-C   1,000 mg at 08/03/22 0827   traZODone (DESYREL) tablet 50 mg  50 mg Oral QHS PRN Evlyn Courier, PA-C       Current Outpatient Medications  Medication Sig Dispense Refill   FLUoxetine (PROZAC) 20 MG capsule Take 1 capsule (20 mg total) by mouth daily. 30 capsule 2   haloperidol decanoate (HALDOL DECANOATE) 100 MG/ML injection Inject 1 mL (100 mg total) into the muscle every 28 (twenty-eight) days. 1 mL 11   hydrOXYzine (ATARAX) 10 MG tablet Take 1 tablet (10 mg total) by mouth 3 (three) times daily as needed. (Patient taking differently: Take 10 mg by mouth 3 (three) times daily as needed for anxiety.) 90 tablet 3   insulin glargine (LANTUS) 100 UNIT/ML Solostar Pen Inject 60 Units into the skin daily. 15 mL 0   Oxcarbazepine (TRILEPTAL) 300 MG tablet Take 1 tablet (300 mg total) by mouth 2 (two) times daily. 60 tablet 1   traZODone (DESYREL) 50 MG tablet Take 1 tablet (50 mg total) by mouth at bedtime as needed for sleep. 30 tablet 3   Blood Glucose Monitoring Suppl (ACCU-CHEK AVIVA PLUS) w/Device KIT Use as directed 1 kit 0   cephALEXin (KEFLEX) 500 MG capsule Take 1 capsule (500 mg total) by mouth 4 (four) times daily. (Patient not taking: Reported on 08/02/2022) 28 capsule 0   glucose blood (ACCU-CHEK AVIVA PLUS) test strip Use as instructed 100 each 12   Insulin Pen Needle 31G X 8 MM MISC Use as directed 100 each 6   Lancets (ACCU-CHEK SOFT TOUCH) lancets Use as instructed 100 each 12   metFORMIN (GLUCOPHAGE) 500 MG tablet Take 2 tablets (1,000 mg total) by mouth 2 (two) times daily with a meal. (Patient taking differently: Take 1,000 mg by mouth daily as needed (high blood sugar).) 120 tablet 6    Lab Results:  Results for orders placed or performed during the  hospital encounter of 08/02/22 (from the past 48 hour(s))  Comprehensive metabolic panel     Status: Abnormal   Collection Time: 08/02/22  1:02 PM  Result Value Ref Range   Sodium 131 (L) 135 - 145 mmol/L   Potassium 3.6 3.5 - 5.1 mmol/L   Chloride 102 98 - 111 mmol/L   CO2 24 22 - 32 mmol/L   Glucose, Bld 290 (H) 70 - 99 mg/dL    Comment: Glucose reference range applies  only to samples taken after fasting for at least 8 hours.   BUN 11 6 - 20 mg/dL   Creatinine, Ser 0.91 0.44 - 1.00 mg/dL   Calcium 8.8 (L) 8.9 - 10.3 mg/dL   Total Protein 7.2 6.5 - 8.1 g/dL   Albumin 3.4 (L) 3.5 - 5.0 g/dL   AST 16 15 - 41 U/L   ALT 19 0 - 44 U/L   Alkaline Phosphatase 55 38 - 126 U/L   Total Bilirubin 0.5 0.3 - 1.2 mg/dL   GFR, Estimated >60 >60 mL/min    Comment: (NOTE) Calculated using the CKD-EPI Creatinine Equation (2021)    Anion gap 5 5 - 15    Comment: Performed at [facility], [place] [address], [place], [place] [address]  Ethanol     Status: None   Collection Time: 08/02/22  1:02 PM  Result Value Ref Range   Alcohol, Ethyl (B) <10 <10 mg/dL    Comment: (NOTE) Lowest detectable limit for serum alcohol is 10 mg/dL.  For medical purposes only. Performed at [facility], [place] [address] [place], [place] [address]   Salicylate level     Status: Abnormal   Collection Time: 08/02/22  1:02 PM  Result Value Ref Range   Salicylate Lvl <0.9 (L) 7.0 - 30.0 mg/dL    Comment: Performed at [facility] [address] [place], [place] [address]  Acetaminophen level     Status: Abnormal   Collection Time: 08/02/22  1:02 PM  Result Value Ref Range   Acetaminophen (Tylenol), Serum <10 (L) 10 - 30 ug/mL    Comment: (NOTE) Therapeutic concentrations vary significantly. A range of 10-30 ug/mL  may be an effective concentration for many patients. However, some  are best treated at concentrations outside of this range. Acetaminophen concentrations >150 ug/mL at 4 hours after  ingestion  and >50 ug/mL at 12 hours after ingestion are often associated with  toxic reactions.  Performed at [facility], [place] [address], [place], [place] [address]   cbc     Status: Abnormal   Collection Time: 08/02/22  1:02 PM  Result Value Ref Range   WBC 10.7 (H) 4.0 - 10.5 K/uL   RBC 5.21 (H) 3.87 - 5.11 MIL/uL   Hemoglobin 14.7 12.0 - 15.0 g/dL   HCT 43.7 36.0 - 46.0 %   MCV 83.9 80.0 - 100.0 fL   MCH 28.2 26.0 - 34.0 pg   MCHC 33.6 30.0 - 36.0 g/dL   RDW 13.2 11.5 - 15.5 %   Platelets 313 150 - 400 K/uL   nRBC 0.0 0.0 - 0.2 %    Comment: Performed at [facility], [place] [address] [place], [place] [address]  Rapid urine drug screen (hospital performed)     Status: Abnormal   Collection Time: 08/02/22  1:02 PM  Result Value Ref Range   Opiates NONE DETECTED NONE DETECTED   Cocaine NONE DETECTED NONE DETECTED   Benzodiazepines NONE DETECTED NONE DETECTED   Amphetamines NONE DETECTED NONE DETECTED   Tetrahydrocannabinol POSITIVE (A) NONE DETECTED   Barbiturates NONE DETECTED NONE DETECTED    Comment: (NOTE) DRUG SCREEN FOR MEDICAL PURPOSES ONLY.  IF CONFIRMATION IS NEEDED FOR ANY PURPOSE, NOTIFY LAB WITHIN 5 DAYS.  LOWEST DETECTABLE LIMITS FOR URINE DRUG SCREEN Drug Class                     Cutoff (ng/mL) Amphetamine and  metabolites    1000 Barbiturate and metabolites    200 Benzodiazepine                 417 Tricyclics and metabolites     300 Opiates and metabolites        300 Cocaine and metabolites        300 THC                            50 Performed at [facility], [place] [address] [place], [place] [address]   I-Stat beta hCG blood, ED     Status: None   Collection Time: 08/02/22  1:39 PM  Result Value Ref Range   I-stat hCG, quantitative <5.0 <5 mIU/mL   Comment 3            Comment:   GEST. AGE      CONC.  (mIU/mL)   <=1 WEEK        5 - 50     2 WEEKS       50 - 500     3 WEEKS       100 - 10,000     4 WEEKS     1,000 -  30,000        FEMALE AND NON-PREGNANT FEMALE:     LESS THAN 5 mIU/mL   Resp Panel by RT-PCR (Flu A&B, Covid) Anterior Nasal Swab     Status: None   Collection Time: 08/02/22  2:59 PM   Specimen: Anterior Nasal Swab  Result Value Ref Range   SARS Coronavirus 2 by RT PCR NEGATIVE NEGATIVE    Comment: (NOTE) SARS-CoV-2 target nucleic acids are NOT DETECTED.  The SARS-CoV-2 RNA is generally detectable in upper respiratory specimens during the acute phase of infection. The lowest concentration of SARS-CoV-2 viral copies this assay can detect is 138 copies/mL. A negative result does not preclude SARS-Cov-2 infection and should not be used as the sole basis for treatment or other patient management decisions. A negative result may occur with  improper specimen collection/handling, submission of specimen other than nasopharyngeal swab, presence of viral mutation(s) within the areas targeted by this assay, and inadequate number of viral copies(<138 copies/mL). A negative result must be combined with clinical observations, patient history, and epidemiological information. The expected result is Negative.  Fact Sheet for Patients:  EntrepreneurPulse.com.au  Fact Sheet for Healthcare Providers:  IncredibleEmployment.be  This test is no t yet approved or cleared by the [place] FDA and  has been authorized for detection and/or diagnosis of SARS-CoV-2 by FDA under an Emergency Use Authorization (EUA). This EUA will remain  in effect (meaning this test can be used) for the duration of the COVID-19 declaration under Section 564(b)(1) of the Act, 21 U.S.C.section 360bbb-3(b)(1), unless the authorization is terminated  or revoked sooner.       Influenza A by PCR NEGATIVE NEGATIVE   Influenza B by PCR NEGATIVE NEGATIVE    Comment: (NOTE) The Xpert Xpress SARS-CoV-2/FLU/RSV plus assay is intended as an aid in the diagnosis of influenza from  Nasopharyngeal swab specimens and should not be used as a sole basis for treatment. Nasal washings and aspirates are unacceptable for Xpert Xpress SARS-CoV-2/FLU/RSV testing.  Fact Sheet for Patients: EntrepreneurPulse.com.au  Fact Sheet for Healthcare Providers: IncredibleEmployment.be  This test is not yet approved or cleared by the [place] FDA and has been authorized for detection and/or diagnosis of  SARS-CoV-2 by FDA under an Emergency Use Authorization (EUA). This EUA will remain in effect (meaning this test can be used) for the duration of the COVID-19 declaration under Section 564(b)(1) of the Act, 21 U.S.C. section 360bbb-3(b)(1), unless the authorization is terminated or revoked.  Performed at [facility], [place] [address], [place], [place] [address]   CBG monitoring, ED     Status: Abnormal   Collection Time: 08/02/22  4:25 PM  Result Value Ref Range   Glucose-Capillary 220 (H) 70 - 99 mg/dL    Comment: Glucose reference range applies only to samples taken after fasting for at least 8 hours.  CBG monitoring, ED     Status: Abnormal   Collection Time: 08/02/22  9:18 PM  Result Value Ref Range   Glucose-Capillary 212 (H) 70 - 99 mg/dL    Comment: Glucose reference range applies only to samples taken after fasting for at least 8 hours.  CBG monitoring, ED     Status: Abnormal   Collection Time: 08/03/22  8:19 AM  Result Value Ref Range   Glucose-Capillary 190 (H) 70 - 99 mg/dL    Comment: Glucose reference range applies only to samples taken after fasting for at least 8 hours.    Blood Alcohol level:  Lab Results  Component Value Date   [facility] <10 08/02/2022   ETH <10 02/11/2022   Psychiatric Specialty Exam:  Presentation  General Appearance:  Appropriate for Environment  Eye Contact: Fair  Speech: Clear and Coherent  Speech Volume: Normal  Handedness: Ambidextrous   Mood and Affect  Mood: Depressed;  Hopeless  Affect: Congruent   Thought Process  Thought Processes: Coherent  Descriptions of Associations:Intact  Orientation:Full (Time, Place and Person)  Thought Content:Logical  History of Schizophrenia/Schizoaffective disorder:Yes  Duration of Psychotic Symptoms:No data recorded Hallucinations:Hallucinations: None  Ideas of Reference:None  Suicidal Thoughts:Suicidal Thoughts: Yes, Active SI Active Intent and/or Plan: With Intent; With Plan  Homicidal Thoughts:Homicidal Thoughts: No   Sensorium  Memory: Immediate Good; Recent Good  Judgment: Fair  Insight: Fair   Executive Functions  Concentration: Good  Attention Span: Good  Recall: Good  Fund of Knowledge: Good  Language: Good   Psychomotor Activity  Psychomotor Activity: Psychomotor Activity: Normal   Assets  Assets: Communication Skills; Desire for Improvement; Housing; Leisure Time; Physical Health; Resilience   Sleep  Sleep: Sleep: Fair    Physical Exam: Physical Exam Neurological:     Mental Status: She is alert and oriented to person, place, and time.  Psychiatric:        Mood and Affect: Mood is depressed. Affect is flat.        Speech: Speech normal.        Behavior: Behavior is cooperative.        Thought Content: Thought content includes suicidal ideation.        Cognition and Memory: Cognition normal.    Review of Systems  Psychiatric/Behavioral:  Positive for depression and suicidal ideas.   All other systems reviewed and are negative.  Blood pressure 116/75, pulse 68, temperature 98.6 F (37 C), resp. rate 16, SpO2 99 %. There is no height or weight on file to calculate BMI.   Medical Decision Making: Patient case reviewed and discussed with Dr. Dwyane Dee.  We will continue to recommend inpatient psychiatric treatment.  Patient is [facility] being [facility], if there is no bed availability CSW notified to fax patient out.  EDP, RN, and LCSW  notified  of disposition.  - continue home medications  Vesta Mixer, NP 08/03/2022, 10:04 AM

## 2022-08-03 NOTE — Progress Notes (Addendum)
At 1548 this CSW sent referral out to out of network providers per the request of provider Vesta Mixer, NP. At around 6pm CSW received a phone call from [place] pt has been accepted for tomorrow by Abran Duke to [place] after 9am, and phone number for report [phone].  CSW was then updated by [facility] shift Lynnda Shields, RN that pt can be accepted to [facility] 08/03/22 and night shift [place] [facility] to coordinate. CSW sent communication to [place], RN to confirm pt's arrival time and advise about bed offer at [facility] tomorrow if pt is unable to be accepted fully at [facility]. This CSW is awaiting follow up from [facility] night AC Wynonia Hazard, RN.   Care Team notified: Care Team notified: [facility] Lynnda Shields, RN, Night [facility] Wynonia Hazard, RN, Vesta Mixer, NP, [place] Dec, RN   Destination Service Provider Address Phone Fax  [facility]  [address] [place] [address] [phone]  [place]  [address] [place] [address] [phone]  [facility]  [address] [place] [address] [phone]  [facility]  [place], [place] [address] [phone]  [facility]  [address] [place], [place] [address] [phone] [place] [place] [address] [place]  [facility]  [address] [place] [address] [phone]  [place] [address] [place] [address] [phone]  [facility]  [place] [address] [phone]  [facility]  [address] [place] [address] [phone] [facility]  [address]  [place] [address] [phone] [place]  [address] [place] [address] [phone] [facility]  [address] [place] [address] [phone]  [facility]  [address] [place] [address] [phone]  [facility]  [place], [place] [address] [place]  [facility] [address] [place] [address] [phone] [facility]  [address] [place] [address] [phone] [facility]  [address], [place] [address] [phone] [facility]  [address] [place] [address] [phone]   Benjaman Kindler, MSW, [facility] 08/03/2022 9:01 PM

## 2022-08-03 NOTE — ED Notes (Signed)
Breakfast at bedside; pt continues to sleep

## 2022-08-03 NOTE — Progress Notes (Signed)
Patient is accepted to [facility] 400-1   Accepting: Vesta Mixer NP Attending: [place] Please fax IVC to [phone] prior to sending patient.  Patient can arrive on this shift.

## 2022-08-03 NOTE — ED Notes (Signed)
Pt alert, calm, cooperative, eating breakfast; continues to endorse SI

## 2022-08-03 NOTE — ED Notes (Signed)
Review of patient clipboard in purple zone reveals IVC date of 08/02/2022, expiration date of 08/09/2022

## 2022-08-04 ENCOUNTER — Inpatient Hospital Stay (HOSPITAL_COMMUNITY)
Admission: AD | Admit: 2022-08-04 | Discharge: 2022-08-11 | DRG: 885 | Disposition: A | Discharge status: Home / Self Care | Payer: Medicaid Other | Source: Intra-hospital | Attending: Psychiatry | Admitting: Psychiatry

## 2022-08-04 ENCOUNTER — Encounter (HOSPITAL_COMMUNITY): Payer: Self-pay | Admitting: Psychiatry

## 2022-08-04 ENCOUNTER — Other Ambulatory Visit: Payer: Self-pay

## 2022-08-04 DIAGNOSIS — K5900 Constipation, unspecified: Secondary | ICD-10-CM | POA: Diagnosis present

## 2022-08-04 DIAGNOSIS — F259 Schizoaffective disorder, unspecified: Principal | ICD-10-CM

## 2022-08-04 DIAGNOSIS — Z7984 Long term (current) use of oral hypoglycemic drugs: Secondary | ICD-10-CM

## 2022-08-04 DIAGNOSIS — Z79899 Other long term (current) drug therapy: Secondary | ICD-10-CM

## 2022-08-04 DIAGNOSIS — F250 Schizoaffective disorder, bipolar type: Secondary | ICD-10-CM | POA: Diagnosis present

## 2022-08-04 DIAGNOSIS — G4700 Insomnia, unspecified: Secondary | ICD-10-CM | POA: Diagnosis present

## 2022-08-04 DIAGNOSIS — K30 Functional dyspepsia: Secondary | ICD-10-CM | POA: Diagnosis present

## 2022-08-04 DIAGNOSIS — Z794 Long term (current) use of insulin: Secondary | ICD-10-CM | POA: Diagnosis not present

## 2022-08-04 DIAGNOSIS — R45851 Suicidal ideations: Secondary | ICD-10-CM | POA: Diagnosis not present

## 2022-08-04 DIAGNOSIS — E119 Type 2 diabetes mellitus without complications: Secondary | ICD-10-CM | POA: Diagnosis present

## 2022-08-04 DIAGNOSIS — F411 Generalized anxiety disorder: Secondary | ICD-10-CM | POA: Diagnosis present

## 2022-08-04 DIAGNOSIS — Z91148 Patient's other noncompliance with medication regimen for other reason: Secondary | ICD-10-CM | POA: Diagnosis not present

## 2022-08-04 DIAGNOSIS — F329 Major depressive disorder, single episode, unspecified: Secondary | ICD-10-CM | POA: Diagnosis present

## 2022-08-04 LAB — GLUCOSE, CAPILLARY
Glucose-Capillary: 264 mg/dL — ABNORMAL HIGH (ref 70–99)
Glucose-Capillary: 152 mg/dL — ABNORMAL HIGH (ref 70–99)
Glucose-Capillary: 123 mg/dL — ABNORMAL HIGH (ref 70–99)
Glucose-Capillary: 225 mg/dL — ABNORMAL HIGH (ref 70–99)

## 2022-08-04 MED ORDER — ACETAMINOPHEN 325 MG PO TABS
650.0000 mg | ORAL_TABLET | Freq: Four times a day (QID) | ORAL | Status: DC | PRN
  Administered 2022-08-09 – 2022-08-10 (×2): 650 mg via ORAL
  Filled 2022-08-04 (×2): qty 2

## 2022-08-04 MED ORDER — INSULIN GLARGINE-YFGN 100 UNIT/ML ~~LOC~~ SOLN
60.0000 [IU] | Freq: Every day | SUBCUTANEOUS | Status: DC
  Administered 2022-08-04 – 2022-08-10 (×6): 60 [IU] via SUBCUTANEOUS

## 2022-08-04 MED ORDER — HYDROXYZINE HCL 10 MG PO TABS
10.0000 mg | ORAL_TABLET | Freq: Three times a day (TID) | ORAL | Status: DC | PRN
  Administered 2022-08-08 – 2022-08-10 (×4): 10 mg via ORAL
  Filled 2022-08-04 (×6): qty 1

## 2022-08-04 MED ORDER — INSULIN ASPART 100 UNIT/ML IJ SOLN
0.0000 [IU] | Freq: Three times a day (TID) | INTRAMUSCULAR | Status: DC
  Administered 2022-08-04: 3 [IU] via SUBCUTANEOUS
  Administered 2022-08-04: 8 [IU] via SUBCUTANEOUS
  Administered 2022-08-05: 5 [IU] via SUBCUTANEOUS
  Administered 2022-08-05 – 2022-08-06 (×2): 3 [IU] via SUBCUTANEOUS
  Administered 2022-08-06: 2 [IU] via SUBCUTANEOUS
  Administered 2022-08-06 – 2022-08-07 (×2): 3 [IU] via SUBCUTANEOUS
  Administered 2022-08-07: 8 [IU] via SUBCUTANEOUS
  Administered 2022-08-07 – 2022-08-08 (×3): 3 [IU] via SUBCUTANEOUS
  Administered 2022-08-08: 2 [IU] via SUBCUTANEOUS
  Administered 2022-08-09: 3 [IU] via SUBCUTANEOUS
  Administered 2022-08-09: 5 [IU] via SUBCUTANEOUS
  Administered 2022-08-09 – 2022-08-10 (×3): 3 [IU] via SUBCUTANEOUS
  Administered 2022-08-11 (×2): 2 [IU] via SUBCUTANEOUS

## 2022-08-04 MED ORDER — METFORMIN HCL 500 MG PO TABS
1000.0000 mg | ORAL_TABLET | Freq: Two times a day (BID) | ORAL | Status: DC
  Administered 2022-08-04 – 2022-08-05 (×3): 1000 mg via ORAL
  Filled 2022-08-04 (×7): qty 2

## 2022-08-04 MED ORDER — ALUM & MAG HYDROXIDE-SIMETH 200-200-20 MG/5ML PO SUSP: 30.0000 mL | ORAL | Status: DC | PRN

## 2022-08-04 MED ORDER — FLUOXETINE HCL 20 MG PO CAPS
20.0000 mg | ORAL_CAPSULE | Freq: Every day | ORAL | Status: DC
  Administered 2022-08-04 – 2022-08-06 (×3): 20 mg via ORAL
  Filled 2022-08-04 (×5): qty 1

## 2022-08-04 MED ORDER — TRAZODONE HCL 50 MG PO TABS
50.0000 mg | ORAL_TABLET | Freq: Every evening | ORAL | Status: DC | PRN
  Administered 2022-08-04 – 2022-08-10 (×4): 50 mg via ORAL
  Filled 2022-08-04 (×6): qty 1

## 2022-08-04 MED ORDER — INSULIN ASPART 100 UNIT/ML IJ SOLN
0.0000 [IU] | Freq: Every day | INTRAMUSCULAR | Status: DC
  Administered 2022-08-04 – 2022-08-09 (×3): 2 [IU] via SUBCUTANEOUS

## 2022-08-04 MED ORDER — NON FORMULARY: 60.0000 [IU] | Freq: Every day | Status: DC

## 2022-08-04 NOTE — Progress Notes (Signed)
Patient ID: Charlottie Peragine Mcguffin, female   DOB: 07/22/1996, 26 y.o.   MRN: [id number] Admission note: D:Patient is an IVC admission in no acute distress for depression, anxiety and suicide ideations. Pt reported long hx of depression and cutting. Pt believes her current medication is not working because she is shutting down and feels better dead than alive. Pt reports she has been worried about everyone but herself and has lost interest in the things she enjoy most. Pt reports she did not want hurt herself but try and get help. Pt admitted to unit per protocol, skin assessment and belonging search done by another staff member. Consent signed by pt. Pt educated on therapeutic milieu rules. Pt was introduced to milieu by nursing staff. Fall risk / suicide safety plan explained to the patient. 15 minutes checks started for safety.  t.

## 2022-08-04 NOTE — Inpatient Diabetes Management (Signed)
Inpatient Diabetes Program Recommendations  AACE/ADA: New Consensus Statement on Inpatient Glycemic Control   Target Ranges:  Prepandial:   less than 140 mg/dL      Peak postprandial:   less than 180 mg/dL (1-2 hours)      Critically ill patients:  140 - 180 mg/dL    Latest Reference Range & Units 08/03/22 08:19 08/03/22 12:21 08/03/22 16:47 08/03/22 22:10 08/04/22 06:15  Glucose-Capillary 70 - 99 mg/dL 190 (H) 128 (H) 169 (H) 137 (H) 264 (H)    Review of Glycemic Control  Diabetes history: DM2 Outpatient Diabetes medications: Lantus 60 units daily, Metformin 1000 mg BID Current orders for Inpatient glycemic control: Novolog 0-15 units TID with meals, Novolog 0-5 units QHS, Metformin 1000 mg BID  Inpatient Diabetes Program Recommendations:    Insulin: Fasting glucose 264 mg/dl today. If glucose remains consistently over 180 mg/dl, please consider ordering Semglee 10 units Q24H.   Thanks, Barnie Alderman, RN, MSN, [place] Diabetes Coordinator Inpatient Diabetes Program [phone] (Team Pager from 8am to [place])

## 2022-08-04 NOTE — Progress Notes (Signed)
Nursing Note: 0700-1900  D:   Goal for today: "  Pt reports that she continues to have passive SI thoughts. "I just want to kill myself. I just don't want to be here anymore- these thoughts race through my head all the time." No plan identified. States she slept "good" last night and appetite is "still bad."  Pt does not like taking her Metformin, "It upsets my stomach- I hate that stuff, Can someone decrease it?  Pt did not take full dose of 1000 mg of Metformin at dinnertime, would to take 500mg  only.  A:  Pt. encouraged to verbalize needs and concerns, active listening and support provided.  Continued Q 15 minute safety checks.    R:  Pt. is pleasant and cooperative, states "I want to be happy and smile again."  She slept intermittently throughout the shift, did not attend groups. Denies A/V hallucinations and is currently able to verbally contract for safety.

## 2022-08-04 NOTE — [facility] Suicide Risk Assessment (Signed)
[facility] INPATIENT:  Family/Significant Other Suicide Prevention Education  Suicide Prevention Education:  Patient Refusal for Family/Significant Other Suicide Prevention Education: The patient Cindy Leonard has refused to provide written consent for family/significant other to be provided Family/Significant Other Suicide Prevention Education during admission and/or prior to discharge.  Physician notified.  Frutoso Chase Ruthia Person 08/04/2022, 9:52 AM
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
## 2022-08-04 NOTE — Group Note (Unsigned)
Date:  08/04/2022 Time:  11:13 AM  Group Topic/Focus:  Goals Group:   The focus of this group is to help patients establish daily goals to achieve during treatment and discuss how the patient can incorporate goal setting into their daily lives to aide in recovery. Orientation:   The focus of this group is to educate the patient on the purpose and policies of crisis stabilization and provide a format to answer questions about their admission.  The group details unit policies and expectations of patients while admitted.     Participation Level:  {BHH PARTICIPATION LEVEL:22264}  Participation Quality:  {BHH PARTICIPATION QUALITY:22265}  Affect:  {BHH AFFECT:22266}  Cognitive:  {BHH COGNITIVE:22267}  Insight: {BHH Insight2:20797}  Engagement in Group:  {BHH ENGAGEMENT IN GROUP:22268}  Modes of Intervention:  {BHH MODES OF INTERVENTION:22269}  Additional Comments:  ***  Faylynn Stamos Lashawn Jamilyn Pigeon 08/04/2022, 11:13 AM  

## 2022-08-04 NOTE — ED Notes (Signed)
[facility] called for transport 

## 2022-08-04 NOTE — BHH Counselor (Signed)
CSW provided the Pt with a packet that contains information including shelter and housing resources, free and reduced price food information, clothing resources, crisis center information, a [employer] card, and suicide prevention information.   

## 2022-08-04 NOTE — Progress Notes (Signed)
Patient did not attend group.

## 2022-08-04 NOTE — Progress Notes (Signed)
Pt stated she was feeling a little better tonight    08/04/22 2100  Psych Admission Type (Psych Patients Only)  Admission Status Involuntary  Psychosocial Assessment  Patient Complaints Anxiety;Depression  Eye Contact Fair  Facial Expression Anxious  Affect Depressed  Speech Logical/coherent  Interaction Assertive  Motor Activity Fidgety  Appearance/Hygiene Unremarkable  Behavior Characteristics Cooperative  Mood Anxious;Depressed  Aggressive Behavior  Effect No apparent injury  Thought Process  Coherency WDL  Content WDL  Delusions WDL  Perception WDL  Hallucination None reported or observed  Judgment WDL  Confusion WDL  Danger to Self  Current suicidal ideation? Passive  Danger to Others  Danger to Others None reported or observed

## 2022-08-04 NOTE — Tx Team (Signed)
Initial Treatment Plan 08/04/2022 6:12 AM Sherre Scarlet Winebarger PPJ:[id number]    PATIENT STRESSORS: Financial difficulties   Health problems   Medication change or noncompliance   Occupational concerns   Substance abuse     PATIENT STRENGTHS: Ability for insight  Active sense of humor  Average or above average intelligence  Financial means  Motivation for treatment/growth  Supportive family/friends    PATIENT IDENTIFIED PROBLEMS:   depression  anxiety  Suicidal ideation  "To smile again"  "Be back"           DISCHARGE CRITERIA:  Improved stabilization in mood, thinking, and/or behavior Medical problems require only outpatient monitoring Motivation to continue treatment in a less acute level of care Verbal commitment to aftercare and medication compliance  PRELIMINARY DISCHARGE PLAN: Attend aftercare/continuing care group Placement in alternative living arrangements Return to previous living arrangement  PATIENT/FAMILY INVOLVEMENT: This treatment plan has been presented to and reviewed with the patient, Cindy Leonard,  The patient and family have been given the opportunity to ask questions and make suggestions.  JEHU-APPIAH, Megan Salon, RN 08/04/2022, 6:12 AM
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
## 2022-08-05 ENCOUNTER — Encounter (HOSPITAL_COMMUNITY): Payer: Self-pay

## 2022-08-05 LAB — GLUCOSE, CAPILLARY
Glucose-Capillary: 158 mg/dL — ABNORMAL HIGH (ref 70–99)
Glucose-Capillary: 114 mg/dL — ABNORMAL HIGH (ref 70–99)
Glucose-Capillary: 208 mg/dL — ABNORMAL HIGH (ref 70–99)

## 2022-08-05 MED ORDER — METFORMIN HCL 500 MG PO TABS
500.0000 mg | ORAL_TABLET | Freq: Two times a day (BID) | ORAL | Status: DC
  Administered 2022-08-05 – 2022-08-08 (×6): 500 mg via ORAL
  Filled 2022-08-05 (×12): qty 1

## 2022-08-05 NOTE — BHH Group Notes (Signed)
Adult Goals Group Note  Date:  08/05/2022 Time:  2:05 PM  Group Topic/Focus:  Goals Group:   The focus of this group is to help patients establish daily goals to achieve during treatment and discuss how the patient can incorporate goal setting into their daily lives to aide in recovery.  Participation Level:  Did Not Attend  Kern Reap 08/05/2022, 2:05 PM

## 2022-08-05 NOTE — BH IP Treatment Plan (Signed)
Interdisciplinary Treatment and Diagnostic Plan Update  08/05/2022 Time of Session: 1000 Cindy Leonard MRN: [id number]  Principal Diagnosis: Schizoaffective disorder (HCC)  Secondary Diagnoses: Principal Problem:   Schizoaffective disorder (HCC) Active Problems:   Generalized anxiety disorder   Insomnia   Current Medications:  Current Facility-Administered Medications  Medication Dose Route Frequency Provider Last Rate Last Admin   acetaminophen (TYLENOL) tablet 650 mg  650 mg Oral Q6H PRN Coleman, Mikaela, NP       alum & mag hydroxide-simeth (MAALOX/MYLANTA) 200-200-20 MG/5ML suspension 30 mL  30 mL Oral Q4H PRN Coleman, Mikaela, NP       FLUoxetine (PROZAC) capsule 20 mg  20 mg Oral Daily Coleman, Mikaela, NP   20 mg at 08/05/22 0802   hydrOXYzine (ATARAX) tablet 10 mg  10 mg Oral TID PRN Coleman, Mikaela, NP       insulin aspart (novoLOG) injection 0-15 Units  0-15 Units Subcutaneous TID WC Coleman, Mikaela, NP   3 Units at 08/05/22 0633   insulin aspart (novoLOG) injection 0-5 Units  0-5 Units Subcutaneous QHS Coleman, Mikaela, NP   2 Units at 08/04/22 2106   insulin glargine-yfgn (SEMGLEE) injection 60 Units  60 Units Subcutaneous QHS Massengill, Nathan, MD   60 Units at 08/04/22 2106   metFORMIN (GLUCOPHAGE) tablet 1,000 mg  1,000 mg Oral BID WC Coleman, Mikaela, NP   1,000 mg at 08/05/22 0802   traZODone (DESYREL) tablet 50 mg  50 mg Oral QHS PRN Coleman, Mikaela, NP   50 mg at 08/04/22 2104   PTA Medications: Facility-Administered Medications Prior to Admission  Medication Dose Route Frequency Provider Last Rate Last Admin   haloperidol decanoate (HALDOL DECANOATE) 100 MG/ML injection 100 mg  100 mg Intramuscular Once Parsons, Brittney E, NP       Medications Prior to Admission  Medication Sig Dispense Refill Last Dose   Blood Glucose Monitoring Suppl (ACCU-CHEK AVIVA PLUS) w/Device KIT Use as directed 1 kit 0    cephALEXin (KEFLEX) 500 MG capsule Take 1 capsule (500 mg  total) by mouth 4 (four) times daily. (Patient not taking: Reported on 08/02/2022) 28 capsule 0    FLUoxetine (PROZAC) 20 MG capsule Take 1 capsule (20 mg total) by mouth daily. 30 capsule 2    glucose blood (ACCU-CHEK AVIVA PLUS) test strip Use as instructed 100 each 12    haloperidol decanoate (HALDOL DECANOATE) 100 MG/ML injection Inject 1 mL (100 mg total) into the muscle every 28 (twenty-eight) days. 1 mL 11    hydrOXYzine (ATARAX) 10 MG tablet Take 1 tablet (10 mg total) by mouth 3 (three) times daily as needed. (Patient taking differently: Take 10 mg by mouth 3 (three) times daily as needed for anxiety.) 90 tablet 3    insulin glargine (LANTUS) 100 UNIT/ML Solostar Pen Inject 60 Units into the skin daily. 15 mL 0    Insulin Pen Needle 31G X 8 MM MISC Use as directed 100 each 6    Lancets (ACCU-CHEK SOFT TOUCH) lancets Use as instructed 100 each 12    metFORMIN (GLUCOPHAGE) 500 MG tablet Take 2 tablets (1,000 mg total) by mouth 2 (two) times daily with a meal. (Patient taking differently: Take 1,000 mg by mouth daily as needed (high blood sugar).) 120 tablet 6    Oxcarbazepine (TRILEPTAL) 300 MG tablet Take 1 tablet (300 mg total) by mouth 2 (two) times daily. 60 tablet 1    traZODone (DESYREL) 50 MG tablet Take 1 tablet (50 mg total) by mouth   at bedtime as needed for sleep. 30 tablet 3     Patient Stressors: Financial difficulties   Health problems   Medication change or noncompliance   Occupational concerns   Substance abuse    Patient Strengths: Ability for insight  Active sense of humor  Average or above average intelligence  Financial means  Motivation for treatment/growth  Supportive family/friends   Treatment Modalities: Medication Management, Group therapy, Case management,  1 to 1 session with clinician, Psychoeducation, Recreational therapy.   Physician Treatment Plan for Primary Diagnosis: Schizoaffective disorder ([place]) Long Term Goal(s): Improvement in symptoms so as  ready for discharge   Short Term Goals: Ability to identify changes in lifestyle to reduce recurrence of condition will improve Ability to verbalize feelings will improve Ability to disclose and discuss suicidal ideas Ability to demonstrate self-control will improve Ability to identify and develop effective coping behaviors will improve Ability to maintain clinical measurements within normal limits will improve Compliance with prescribed medications will improve Ability to identify triggers associated with substance abuse/mental health issues will improve  Medication Management: Evaluate patient's response, side effects, and tolerance of medication regimen.  Therapeutic Interventions: 1 to 1 sessions, Unit Group sessions and Medication administration.  Evaluation of Outcomes: Progressing  Physician Treatment Plan for Secondary Diagnosis: Principal Problem:   Schizoaffective disorder ([employer]) Active Problems:   Generalized anxiety disorder   Insomnia  Long Term Goal(s): Improvement in symptoms so as ready for discharge   Short Term Goals: Ability to identify changes in lifestyle to reduce recurrence of condition will improve Ability to verbalize feelings will improve Ability to disclose and discuss suicidal ideas Ability to demonstrate self-control will improve Ability to identify and develop effective coping behaviors will improve Ability to maintain clinical measurements within normal limits will improve Compliance with prescribed medications will improve Ability to identify triggers associated with substance abuse/mental health issues will improve     Medication Management: Evaluate patient's response, side effects, and tolerance of medication regimen.  Therapeutic Interventions: 1 to 1 sessions, Unit Group sessions and Medication administration.  Evaluation of Outcomes: Progressing   RN Treatment Plan for Primary Diagnosis: Schizoaffective disorder ([place]) Long Term Goal(s):  Knowledge of disease and therapeutic regimen to maintain health will improve  Short Term Goals: Ability to remain free from injury will improve, Ability to verbalize frustration and anger appropriately will improve, Ability to demonstrate self-control, Ability to participate in decision making will improve, Ability to verbalize feelings will improve, Ability to disclose and discuss suicidal ideas, Ability to identify and develop effective coping behaviors will improve, and Compliance with prescribed medications will improve  Medication Management: RN will administer medications as ordered by provider, will assess and evaluate patient's response and provide education to patient for prescribed medication. RN will report any adverse and/or side effects to prescribing provider.  Therapeutic Interventions: 1 on 1 counseling sessions, Psychoeducation, Medication administration, Evaluate responses to treatment, Monitor vital signs and CBGs as ordered, Perform/monitor CIWA, COWS, AIMS and Fall Risk screenings as ordered, Perform wound care treatments as ordered.  Evaluation of Outcomes: Progressing   LCSW Treatment Plan for Primary Diagnosis: Schizoaffective disorder ([place]) Long Term Goal(s): Safe transition to appropriate next level of care at discharge, Engage patient in therapeutic group addressing interpersonal concerns.  Short Term Goals: Engage patient in aftercare planning with referrals and resources, Increase social support, Increase ability to appropriately verbalize feelings, Increase emotional regulation, Facilitate acceptance of mental health diagnosis and concerns, Facilitate patient progression through stages of change regarding substance  use diagnoses and concerns, Identify triggers associated with mental health/substance abuse issues, and Increase skills for wellness and recovery  Therapeutic Interventions: Assess for all discharge needs, 1 to 1 time with Social worker, Explore available  resources and support systems, Assess for adequacy in community support network, Educate family and significant other(s) on suicide prevention, Complete Psychosocial Assessment, Interpersonal group therapy.  Evaluation of Outcomes: Progressing   Progress in Treatment: Attending groups: No. Participating in groups: No. Taking medication as prescribed: Yes. Toleration medication: Yes. Family/Significant other contact made: No, will contact:  patient has declined consent to reach collateral  Patient understands diagnosis: Yes. Discussing patient identified problems/goals with staff: Yes. Medical problems stabilized or resolved: Yes. Denies suicidal/homicidal ideation: No. Issues/concerns per patient self-inventory: Yes. Other: none  New problem(s) identified: No, Describe:  none  New Short Term/Long Term Goal(s): Patient to work towards detox, medication management for mood stabilization; elimination of SI thoughts; development of comprehensive mental wellness/sobriety plan.  Patient Goals: Patient states their goal for treatment is to "go to group and talk to my mom and figure out something."  Discharge Plan or Barriers: No psychosocial barriers identified at this time, patient to return to place of residence when appropriate for discharge.   Reason for Continuation of Hospitalization: Suicidal ideation  Estimated Length of Stay: 1-7 days   Last 3 [place] Suicide Severity Risk Score: [place] Admission (Current) from 08/04/2022 in [facility] ED from 08/02/2022 in [facility] Office Visit from 07/28/2022 in [place] High Risk High Risk No Risk       Last PHQ 2/9 Scores:    07/28/2022    8:30 AM 12/04/2021    8:33 AM 12/03/2021   10:20 AM  Depression screen PHQ 2/9  Decreased Interest _0 Down, Depressed, Hopeless _1 PHQ - 2 Score _2 Altered sleeping _3 Tired, decreased energy 0 0 2  Change in appetite 3 0 2  Feeling bad or failure about yourself  _4 Trouble concentrating 0 0 2  Moving slowly or fidgety/restless 0 0 0  Suicidal thoughts 0 0 0  PHQ-9 Score _5 Difficult doing work/chores Not difficult at all Very difficult Very difficult    Scribe for Treatment Team: Durenda Hurt, Latanya Presser 08/05/2022 11:56 AM

## 2022-08-05 NOTE — Progress Notes (Signed)
Pt refused short acting insulin (2 u) tonight. Pt stated the Trazodone gave her nightmares last night, so she did not want to take it tonight. Pt stated she was very depressed and continued to be suicidal but contracts for safety    08/05/22 2000  Psych Admission Type (Psych Patients Only)  Admission Status Involuntary  Psychosocial Assessment  Patient Complaints Anxiety;Depression  Eye Contact Fair  Facial Expression Anxious  Affect Depressed  Speech Logical/coherent  Interaction Assertive  Motor Activity Fidgety  Appearance/Hygiene Unremarkable  Behavior Characteristics Cooperative  Mood Depressed  Aggressive Behavior  Effect No apparent injury  Thought Process  Coherency WDL  Content WDL  Delusions WDL  Perception WDL  Hallucination None reported or observed  Judgment WDL  Confusion WDL  Danger to Self  Current suicidal ideation? Passive  Danger to Others  Danger to Others None reported or observed

## 2022-08-05 NOTE — Progress Notes (Signed)
Pt refused her Long acting insulin tonight, pt CBG was 202 . Pt educated on the importance of taking her medications.

## 2022-08-05 NOTE — Progress Notes (Signed)
Pt attended group and is compliant with medication. Pt blood sugar before lunch 114 so no insullin converage was indicated. Pt continues to endorse suicidal ideations, does contract for safety at this time. Affect remains sad. Q 15 minute checks ongoing for safety.

## 2022-08-05 NOTE — Progress Notes (Signed)
[facility] MD Progress Note  08/05/2022 12:27 PM Cindy Leonard  MRN:  [id number] Subjective:   Cindy Leonard is a 26 year old, African-American female with a past psychiatric history significant for schizoaffective disorder (bipolar type), insomnia, and generalized anxiety disorder who was recently involuntarily admitted to [facility] after presenting to [facility] ED due to worsening depressive symptoms and suicidal ideation.  Patient was seen by Dominga Ferry for reevaluation.  Patient is currently being managed on the following psychiatric medications:  Prozac 20 mg daily Patient also receives Haldol injection monthly, last injection received on 07/28/2022.  Next injection due 08/25/2022.  Patient reports that she feels a little better than yesterday but is not all the way there yet.  Patient continues to endorse depression and rates her depression at a 7 out of 10 (with 10 being the worst).  Patient endorses sadness stating "I'm sad as fuck."  Patient states that she has no happiness at all and believes it is due to being unable to put her needs before everyone else's.  Patient continues to endorse anxiety and rates her anxiety an 8 out of 10.  Patient endorses passive suicidal ideations stating that if she were outside of the facility she would engage in cutting.  Patient is able to contract for safety at this time.  Patient denies homicidal ideations.  She further denies auditory or visual hallucinations and does not appear to be responding to internal/external stimuli.  Patient endorses some mild paranoia explaining that she does not know anyone else's mood and that the other patient's moods could be unpredictable.  Despite patient's concerns over other patient's moods, patient endorses feeling safe.  Patient endorses pain in her back but denies wanting any as needed pain medication.  Patient denies delusional thoughts or telepathy.  Patient expresses to provider that her metformin  causes stomach issues and diarrhea.  Principal Problem: Schizoaffective disorder (HCC) Diagnosis: Principal Problem:   Schizoaffective disorder (HCC) Active Problems:   Generalized anxiety disorder   Insomnia  Total Time spent with patient: 15 minutes  Past Psychiatric History:  Schizoaffective disorder, bipolar type Anxiety Insomnia  Past Medical History:  Past Medical History:  Diagnosis Date   Bipolar 1 disorder (HCC)    Depression    Diabetes mellitus without complication (HCC)    Schizophrenia (HCC)     Past Surgical History:  Procedure Laterality Date   INCISION AND DRAINAGE ABSCESS Left 12/08/2020   Procedure: INCISION AND DRAINAGE POSTERIOR NECK ABSCESS;  Surgeon: Gaynelle Adu, MD;  Location: [facility] OR;  Service: General;  Laterality: Left;   NO PAST SURGERIES     Family History:  Family History  Problem Relation Age of Onset   Stroke Mother    Diabetes Other    Hypertension Other    Asthma Father    Asthma Brother    Family Psychiatric  History:  Mother (maternal) - patient is  unsure of psychiatric issues Aunt (maternal) - past history of suicide attempt  Social History:  Social History   Substance and Sexual Activity  Alcohol Use No     Social History   Substance and Sexual Activity  Drug Use Yes   Types: Marijuana   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   Sexual activity: Yes    Birth  control/protection: None  Other Topics Concern   Not on file  Social History Narrative   Not on file   Social Determinants of Health   Financial Resource Strain: Not on file  Food Insecurity: No Food Insecurity (08/04/2022)   Hunger Vital Sign    Worried About Running Out of Food in the Last Year: Never true    Ran Out of Food in the Last Year: Never true  Transportation Needs: Unknown (08/04/2022)   PRAPARE - Transportation    Lack of Transportation (Medical): Patient refused    Lack of Transportation (Non-Medical): Patient refused  Physical Activity: Not on file  Stress: Not on file  Social Connections: Not on file   Additional Social History:  Per chart review (12/04/2021) - Her highest grade completed was the 11th grade in high school. She does not work and receives disability income. Patient reports living with her grandmother but is not satisfied with her living arrangement because her grandmother "smokes crack". Patient reports being uncomfortable with her grandmother's visitors at the home.  Sleep: Good  Appetite:  Good  Current Medications: Current Facility-Administered Medications  Medication Dose Route Frequency Provider Last Rate Last Admin   acetaminophen (TYLENOL) tablet 650 mg  650 mg Oral Q6H PRN Vesta Mixer, NP       alum & mag hydroxide-simeth (MAALOX/MYLANTA) 200-200-20 MG/5ML suspension 30 mL  30 mL Oral Q4H PRN Vesta Mixer, NP       FLUoxetine (PROZAC) capsule 20 mg  20 mg Oral Daily Vesta Mixer, NP   20 mg at 08/05/22 0802   hydrOXYzine (ATARAX) tablet 10 mg  10 mg Oral TID PRN Vesta Mixer, NP       insulin aspart (novoLOG) injection 0-15 Units  0-15 Units Subcutaneous TID WC Vesta Mixer, NP   3 Units at 08/05/22 1027   insulin aspart (novoLOG) injection 0-5 Units  0-5 Units Subcutaneous QHS Vesta Mixer, NP   2 Units at 08/04/22 2106   insulin glargine-yfgn (SEMGLEE) injection 60 Units  60 Units Subcutaneous QHS Massengill, Ovid Curd,  MD   60 Units at 08/04/22 2106   metFORMIN (GLUCOPHAGE) tablet 500 mg  500 mg Oral BID WC Jillene Wehrenberg E, PA       traZODone (DESYREL) tablet 50 mg  50 mg Oral QHS PRN Vesta Mixer, NP   50 mg at 08/04/22 2104    Lab Results:  Results for orders placed or performed during the hospital encounter of 08/04/22 (from the past 48 hour(s))  Glucose, capillary     Status: Abnormal   Collection Time: 08/04/22  6:15 AM  Result Value Ref Range   Glucose-Capillary 264 (H) 70 - 99 mg/dL    Comment: Glucose reference range applies only to samples taken after fasting for at least 8 hours.  Glucose, capillary     Status: Abnormal   Collection Time: 08/04/22 12:05 PM  Result Value Ref Range   Glucose-Capillary 152 (H) 70 - 99 mg/dL    Comment: Glucose  reference range applies only to samples taken after fasting for at least 8 hours.  Glucose, capillary     Status: Abnormal   Collection Time: 08/04/22  5:27 PM  Result Value Ref Range   Glucose-Capillary 123 (H) 70 - 99 mg/dL    Comment: Glucose reference range applies only to samples taken after fasting for at least 8 hours.  Glucose, capillary     Status: Abnormal   Collection Time: 08/04/22  8:06 PM  Result Value Ref Range   Glucose-Capillary 225 (H) 70 - 99 mg/dL    Comment: Glucose reference range applies only to samples taken after fasting for at least 8 hours.  Glucose, capillary     Status: Abnormal   Collection Time: 08/05/22  5:51 AM  Result Value Ref Range   Glucose-Capillary 158 (H) 70 - 99 mg/dL    Comment: Glucose reference range applies only to samples taken after fasting for at least 8 hours.   Comment 1 Notify RN   Glucose, capillary     Status: Abnormal   Collection Time: 08/05/22 12:11 PM  Result Value Ref Range   Glucose-Capillary 114 (H) 70 - 99 mg/dL    Comment: Glucose reference range applies only to samples taken after fasting for at least 8 hours.    Blood Alcohol level:  Lab Results  Component Value Date   ETH  <10 08/02/2022   ETH <10 02/11/2022    Metabolic Disorder Labs: Lab Results  Component Value Date   HGBA1C 12.5 (H) 02/11/2022   MPG 312.05 02/11/2022   MPG >398 12/09/2020   Lab Results  Component Value Date   PROLACTIN 11.9 02/11/2022   Lab Results  Component Value Date   CHOL 157 02/11/2022   TRIG 487 (H) 02/11/2022   HDL 23 (L) 02/11/2022   CHOLHDL 6.8 02/11/2022   VLDL UNABLE TO CALCULATE IF TRIGLYCERIDE OVER 400 mg/dL 52/77/8242   LDLCALC UNABLE TO CALCULATE IF TRIGLYCERIDE OVER 400 mg/dL 35/36/1443    Physical Findings: AIMS: Facial and Oral Movements Muscles of Facial Expression: None, normal Lips and Perioral Area: None, normal Jaw: None, normal Tongue: None, normal,Extremity Movements Upper (arms, wrists, hands, fingers): None, normal Lower (legs, knees, ankles, toes): None, normal, Trunk Movements Neck, shoulders, hips: None, normal, Overall Severity Severity of abnormal movements (highest score from questions above): None, normal Incapacitation due to abnormal movements: None, normal Patient's awareness of abnormal movements (rate only patient's report): No Awareness, Dental Status Current problems with teeth and/or dentures?: No Does patient usually wear dentures?: No  CIWA:    COWS:     Musculoskeletal: Strength & Muscle Tone: within normal limits Gait & Station: normal Patient leans: N/A  Psychiatric Specialty Exam:  Presentation  General Appearance:  Appropriate for Environment; Casual  Eye Contact: Good  Speech: Clear and Coherent; Normal Rate  Speech Volume: Normal  Handedness: Ambidextrous   Mood and Affect  Mood: Depressed; Anxious  Affect: Congruent   Thought Process  Thought Processes: Coherent; Linear  Descriptions of Associations:Intact  Orientation:Full (Time, Place and Person)  Thought Content:Logical  History of Schizophrenia/Schizoaffective disorder:Yes  Duration of Psychotic Symptoms:No data  recorded Hallucinations:Hallucinations: None  Ideas of Reference:None  Suicidal Thoughts:Suicidal Thoughts: Yes, Passive SI Active Intent and/or Plan: With Intent; With Plan SI Passive Intent and/or Plan: Without Intent; Without Plan; Without Means to Carry Out  Homicidal Thoughts:Homicidal Thoughts: No   Sensorium  Memory: Immediate Good; Recent Good; Remote Good  Judgment: Fair  Insight: Chief Executive Officer  Concentration: Good  Attention Span: Good  Recall: Good  Fund of Knowledge: Good  Language: Good   Psychomotor Activity  Psychomotor Activity: Psychomotor Activity: Normal   Assets  Assets: Communication Skills; Desire for Improvement; Leisure Time; Resilience; Physical Health   Sleep  Sleep: Sleep: Fair    Physical Exam: Physical Exam Psychiatric:        Attention and Perception: Attention and perception normal. She does not perceive auditory or visual hallucinations.        Mood and Affect: Affect normal. Mood is anxious and depressed.        Speech: Speech normal.        Behavior: Behavior normal. Behavior is cooperative.        Thought Content: Thought content includes suicidal ideation. Thought content does not include homicidal ideation. Thought content does not include suicidal plan.        Cognition and Memory: Cognition and memory normal.        Judgment: Judgment normal.    Review of Systems  Constitutional: Negative.   HENT: Negative.    Eyes: Negative.   Respiratory: Negative.    Cardiovascular: Negative.   Gastrointestinal: Negative.   Musculoskeletal: Negative.   Neurological: Negative.   Psychiatric/Behavioral:  Positive for depression and suicidal ideas (Patient endorses passive suicidal ideations). Negative for hallucinations and substance abuse. The patient is nervous/anxious. The patient does not have insomnia.    Blood pressure 102/88, pulse 95, temperature 98.7 F (37.1 C), temperature source Oral, resp.  rate 18, height 5\' 7"  (1.702 m), weight (!) 137.3 kg, SpO2 98 %. Body mass index is 47.41 kg/m.   Treatment Plan Summary: Daily contact with patient to assess and evaluate symptoms and progress in treatment and Medication management  Safety and Monitoring: Involuntary admission to inpatient psychiatric unit for safety, stabilization and treatment Daily contact with patient to assess and evaluate symptoms and progress in treatment Patient's case to be discussed in multi-disciplinary team meeting Observation Level : q15 minute checks Vital signs: q12 hours Precautions: safety     Diagnoses:  Principal Problem:   Schizoaffective disorder (HCC) Active Problems:   Generalized anxiety disorder   Insomnia    Schizoaffective disorder (bipolar type) -Patient is currently on Haloperidol Decanoate 100 mg/mL injection, provider to determine when her last injection was received Last injection was received 9/26, next haldol dec injection due 10/24 Continue prozac 20 mg daily for depression, consider titrating to 40 mg daily if needed.   Generalized anxiety disorder -Continue Hydroxyzine 25 mg every 6 hours PRN   Insomnia -Continue trazodone 50 mg at bedtime as needed   Continue home meds for diabetes, monitor FSBS and address accordingly. -Decrease patient's metformin to 500 mg 2 times daily due to experiencing GI issues (stomach pain and diarrhea)   Other PRNS -Start Tylenol 650 mg every 6 hours PRN for mild pain -Start Maalox 30 mg every 4 hrs PRN for indigestion -Start Milk of Magnesia as needed every 6 hrs for constipation  Patient was counseled regard need to abstain completely from marijuana and cocaine after dc.    Encouraged patient to attend group as a way to acquire new coping skills  Discharge Planning: Social work and case management to assist with discharge planning and identification of hospital follow-up needs prior to discharge Discharge Concerns: Need to establish  a safety plan; Medication compliance and effectiveness Discharge Goals: Return home with outpatient referrals for mental health follow-up including medication management/psychotherapy   I certify that inpatient services  furnished can reasonably be expected to improve the patient's condition.   Meta HatchetUchenna E Jerry Haugen, PA 08/05/2022, 12:27 PM

## 2022-08-05 NOTE — Group Note (Signed)
LCSW Group Therapy Note   Group Date: 08/05/2022 Start Time: 1300 End Time: 1400  Type of Therapy and Topic: Group Therapy: Anger Management   Participation Level:  Did Not Attend  Description of Group: In this group, patients will learn helpful strategies and techniques to manage anger, express anger in alternative ways, change hostile attitudes, and prevent aggressive acts, such as verbal abuse and violence. This group will be process-oriented and educational, with patients participating in exploration of their own experiences as well as giving and receiving support and challenge from other group members.  Therapeutic Goals: Patient will learn to manage anger. Patient will learn to stop violence or the threat of violence. Patient will learn to develop self control over thoughts and actions. Patient will receive support and feedback from others  Summary of Patient Progress: Did not attend    Therapeutic Modalities: Cognitive Behavioral Therapy Solution Focused [place], [place] 08/05/2022  2:04 PM

## 2022-08-05 NOTE — Group Note (Signed)
Recreation Therapy Group Note   Group Topic:Team Building  Group Date: 08/05/2022 Start Time: 0930 End Time: 0959 Facilitators: Lillah Standre-McCall, LRT,CTRS Location: [address] Day[address]   Goal Area(s) Addresses:  Patient will effectively work with peer towards shared goal.  Patient will identify skill used to make activity successful.  Patient will identify how skills used during activity can be used to reach post d/c goals.    Group Description: Patient(s) were given a set of solo cups, a rubber band, and some tied strings. The objective is to build a pyramid with the cups by only using the rubber band and string to move the cups. After the activity the patient(s) are LRT debriefed and discussed what strategies worked, what didn't, and what lessons they can take from the activity and use in life post discharge.    Affect/Mood: N/A   Participation Level: Did not attend    Clinical Observations/Individualized Feedback:     Plan: Continue to engage patient in RT group sessions 2-3x/week.   Gaylin Bulthuis-McCall, LRT,CTRS 08/05/2022 12:10 PM

## 2022-08-05 NOTE — Progress Notes (Signed)
   08/05/22 0530  Sleep  Number of Hours 9    

## 2022-08-05 NOTE — Progress Notes (Signed)
Pt blood work not obtained after failed stick. Will reschedule.

## 2022-08-05 NOTE — Progress Notes (Signed)
Patient did not attend the evening speaker NA meeting.  

## 2022-08-06 ENCOUNTER — Ambulatory Visit: Payer: Medicaid Other | Admitting: Internal Medicine

## 2022-08-06 LAB — COMPREHENSIVE METABOLIC PANEL
ALT: 21 U/L (ref 0–44)
AST: 15 U/L (ref 15–41)
Albumin: 3.6 g/dL (ref 3.5–5.0)
Alkaline Phosphatase: 63 U/L (ref 38–126)
Anion gap: 5 (ref 5–15)
BUN: 14 mg/dL (ref 6–20)
CO2: 27 mmol/L (ref 22–32)
Calcium: 9.2 mg/dL (ref 8.9–10.3)
Chloride: 106 mmol/L (ref 98–111)
Creatinine, Ser: 1.1 mg/dL — ABNORMAL HIGH (ref 0.44–1.00)
GFR, Estimated: >60 mL/min (ref >60)
Glucose, Bld: 159 mg/dL — ABNORMAL HIGH (ref 70–99)
Potassium: 3.8 mmol/L (ref 3.5–5.1)
Sodium: 138 mmol/L (ref 135–145)
Total Bilirubin: 0.3 mg/dL (ref 0.3–1.2)
Total Protein: 7.1 g/dL (ref 6.5–8.1)

## 2022-08-06 LAB — CBC WITH DIFFERENTIAL/PLATELET
Abs Immature Granulocytes: 0.02 10*3/uL (ref 0.00–0.07)
Basophils Absolute: 0.1 10*3/uL (ref 0.0–0.1)
Basophils Relative: 1 %
Eosinophils Absolute: 0.3 10*3/uL (ref 0.0–0.5)
Eosinophils Relative: 2 %
HCT: 42 % (ref 36.0–46.0)
Hemoglobin: 13.7 g/dL (ref 12.0–15.0)
Immature Granulocytes: 0 %
Lymphocytes Relative: 39 %
Lymphs Abs: 4.3 10*3/uL — ABNORMAL HIGH (ref 0.7–4.0)
MCH: 28 pg (ref 26.0–34.0)
MCHC: 32.6 g/dL (ref 30.0–36.0)
MCV: 85.7 fL (ref 80.0–100.0)
Monocytes Absolute: 0.7 10*3/uL (ref 0.1–1.0)
Monocytes Relative: 6 %
Neutro Abs: 5.8 10*3/uL (ref 1.7–7.7)
Neutrophils Relative %: 52 %
Platelets: 271 10*3/uL (ref 150–400)
RBC: 4.9 MIL/uL (ref 3.87–5.11)
RDW: 13.3 % (ref 11.5–15.5)
WBC: 11.1 10*3/uL — ABNORMAL HIGH (ref 4.0–10.5)
nRBC: 0 % (ref 0.0–0.2)

## 2022-08-06 LAB — LIPID PANEL
Cholesterol: 136 mg/dL (ref 0–200)
HDL: 23 mg/dL — ABNORMAL LOW (ref >40)
LDL Cholesterol: 60 mg/dL (ref 0–99)
Total CHOL/HDL Ratio: 5.9 RATIO
Triglycerides: 266 mg/dL — ABNORMAL HIGH (ref <150)
VLDL: 53 mg/dL — ABNORMAL HIGH (ref 0–40)

## 2022-08-06 LAB — GLUCOSE, CAPILLARY
Glucose-Capillary: 202 mg/dL — ABNORMAL HIGH (ref 70–99)
Glucose-Capillary: 164 mg/dL — ABNORMAL HIGH (ref 70–99)
Glucose-Capillary: 166 mg/dL — ABNORMAL HIGH (ref 70–99)
Glucose-Capillary: 150 mg/dL — ABNORMAL HIGH (ref 70–99)
Glucose-Capillary: 175 mg/dL — ABNORMAL HIGH (ref 70–99)

## 2022-08-06 LAB — HEMOGLOBIN A1C
Hgb A1c MFr Bld: 11.5 % — ABNORMAL HIGH (ref 4.8–5.6)
Mean Plasma Glucose: 283.35 mg/dL

## 2022-08-06 MED ORDER — FLUOXETINE HCL 20 MG PO CAPS
40.0000 mg | ORAL_CAPSULE | Freq: Every day | ORAL | Status: DC
  Administered 2022-08-07 – 2022-08-11 (×5): 40 mg via ORAL
  Filled 2022-08-06 (×7): qty 2

## 2022-08-06 NOTE — Plan of Care (Signed)
Alert, verbal and able to make needs known. Patient slept well last night. Reports her energy has been low and concentration is poor. Rates her depression 8/10, hopelessness 7/10, and anxiety 7/10. Pt reports having diarrhea this morning, she relates it to the Metformin but the dose has been decreased. Denies any physical pain. Patients goal today is "I want to be happy again." She contracts for safety. She did endorse passive SI this morning- but no plan and contracts for safety. Denies HI/AVH. Patient encouraged to get out of her room more and attend group. Positive coping skills encouraged and verbal encouragement provided.    Problem: Coping: Goal: Level of anxiety will decrease Outcome: Not Progressing   Problem: Safety: Goal: Ability to remain free from injury will improve Outcome: Progressing   Problem: Education: Goal: Utilization of techniques to improve thought processes will improve Outcome: Not Progressing Goal: Knowledge of the prescribed therapeutic regimen will improve Outcome: Not Progressing

## 2022-08-06 NOTE — Progress Notes (Signed)
[facility] MD Progress Note  08/06/2022 4:05 PM Cindy Leonard  MRN:  [id number] Subjective:    Cindy Leonard is a 26 year old, African-American female with a past psychiatric history significant for schizoaffective disorder (bipolar type), insomnia, and generalized anxiety disorder who was recently involuntarily admitted to [facility] after presenting to [facility] ED due to worsening depressive symptoms and suicidal ideation.   Patient was seen by Dominga Ferry for reevaluation.  Patient is currently being managed on the following psychiatric medications:   Prozac 20 mg daily Patient also receives Haldol injection monthly, last injection received on 07/28/2022.  Next injection due 08/25/2022.  Patient reports that she feels better today and states that her suicidal ideations are coming down.  Patient rates her mood at 6 out of 10 (with 10 being severe).  She endorses waking up with some motivation and was able to go to breakfast today.  Patient continues to endorse some depression but states that her symptoms were not as severe as they were the first Barney admitted to this facility.  Patient states that she has been trying to stay more positive and has been utilizing coping skills such as deep breathing exercises.  Patient endorses anxiety and rates her anxiety as 6 out of 10.  Patient continues to endorse passive suicidal ideations but is able to contract for safety.  Patient denies homicidal ideations.  She further denies auditory or visual hallucinations and does not appear to be responding to internal/external stimuli.  Patient endorses mild paranoia attributed to the unpredictable nature of other patients.  Patient denies delusional thoughts or telepathy.  She endorses fair sleep stating that she was unable to sleep last night due to having a new roommate which triggered her past history of trauma.  Patient notes that trazodone also causes her to have nightmares.  Patient endorses fair  appetite.  Patient denies experiencing stomach issues or diarrhea since titrating her metformin down to 500 mg 2 times daily.  Principal Problem: Schizoaffective disorder (HCC) Diagnosis: Principal Problem:   Schizoaffective disorder (HCC) Active Problems:   Generalized anxiety disorder   Insomnia  Total Time spent with patient: 15 minutes  Past Psychiatric History:  Schizoaffective disorder, bipolar type Anxiety Insomnia  Past Medical History:  Past Medical History:  Diagnosis Date   Bipolar 1 disorder (HCC)    Depression    Diabetes mellitus without complication (HCC)    Schizophrenia (HCC)     Past Surgical History:  Procedure Laterality Date   INCISION AND DRAINAGE ABSCESS Left 12/08/2020   Procedure: INCISION AND DRAINAGE POSTERIOR NECK ABSCESS;  Surgeon: Gaynelle Adu, MD;  Location: [facility] OR;  Service: General;  Laterality: Left;   NO PAST SURGERIES     Family History:  Family History  Problem Relation Age of Onset   Stroke Mother    Diabetes Other    Hypertension Other    Asthma Father    Asthma Brother    Family Psychiatric  History:  Mother (maternal) - patient is  unsure of psychiatric issues Aunt (maternal) - past history of suicide attempt  Social History:  Social History   Substance and Sexual Activity  Alcohol Use No     Social History   Substance and Sexual Activity  Drug Use Yes   Types: Marijuana   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 Daily Marijuana use   Sexual activity: Yes    Birth control/protection: None  Other Topics Concern   Not on file  Social History Narrative   Not on file   Social Determinants of Health   Financial Resource Strain: Not on file  Food Insecurity: No Food Insecurity (08/04/2022)   Hunger Vital Sign    Worried About Running Out of Food in the Last Year: Never true    Ran Out of Food in the Last Year: Never true  Transportation Needs: Unknown (08/04/2022)   PRAPARE - Administrator, Civil Service (Medical): Patient refused    Lack of Transportation (Non-Medical): Patient refused  Physical Activity: Not on file  Stress: Not on file  Social Connections: Not on file   Additional Social History:     Sleep: Fair  Appetite:  Fair  Current Medications: Current Facility-Administered Medications  Medication Dose Route Frequency Provider Last Rate Last Admin   acetaminophen (TYLENOL) tablet 650 mg  650 mg Oral Q6H PRN Eligha Bridegroom, NP       alum & mag hydroxide-simeth (MAALOX/MYLANTA) 200-200-20 MG/5ML suspension 30 mL  30 mL Oral Q4H PRN Eligha Bridegroom, NP       [START ON 08/07/2022] FLUoxetine (PROZAC) capsule 40 mg  40 mg Oral Daily Brandis Wixted E, PA       hydrOXYzine (ATARAX) tablet 10 mg  10 mg Oral TID PRN Eligha Bridegroom, NP       insulin aspart (novoLOG) injection 0-15 Units  0-15 Units Subcutaneous TID WC Eligha Bridegroom, NP   3 Units at 08/06/22 1223   insulin aspart (novoLOG) injection 0-5 Units  0-5 Units Subcutaneous QHS Eligha Bridegroom, NP   2 Units at 08/04/22 2106   insulin glargine-yfgn (SEMGLEE) injection 60 Units  60 Units Subcutaneous QHS Massengill, Harrold Donath, MD   60 Units at 08/04/22 2106   metFORMIN (GLUCOPHAGE) tablet 500 mg  500 mg Oral BID WC Kyrian Stage E, PA   500 mg at 08/06/22 0733   traZODone (DESYREL) tablet 50 mg  50 mg Oral QHS PRN Eligha Bridegroom, NP   50 mg at 08/04/22 2104    Lab Results:  Results for orders  placed or performed during the hospital encounter of 08/04/22 (from the past 48 hour(s))  Glucose, capillary     Status: Abnormal   Collection Time: 08/04/22  5:27 PM  Result Value Ref Range   Glucose-Capillary 123 (H) 70 - 99 mg/dL    Comment: Glucose reference range applies only to samples taken after fasting for at least 8 hours.  Glucose, capillary     Status: Abnormal   Collection Time: 08/04/22  8:06 PM  Result Value Ref Range   Glucose-Capillary 225 (H) 70 - 99 mg/dL    Comment: Glucose reference range applies only to samples taken after fasting for at least 8 hours.  Glucose, capillary     Status: Abnormal   Collection Time: 08/05/22  5:51  AM  Result Value Ref Range   Glucose-Capillary 158 (H) 70 - 99 mg/dL    Comment: Glucose reference range applies only to samples taken after fasting for at least 8 hours.   Comment 1 Notify RN   Glucose, capillary     Status: Abnormal   Collection Time: 08/05/22 12:11 PM  Result Value Ref Range   Glucose-Capillary 114 (H) 70 - 99 mg/dL    Comment: Glucose reference range applies only to samples taken after fasting for at least 8 hours.  Glucose, capillary     Status: Abnormal   Collection Time: 08/05/22  5:00 PM  Result Value Ref Range   Glucose-Capillary 208 (H) 70 - 99 mg/dL    Comment: Glucose reference range applies only to samples taken after fasting for at least 8 hours.  Glucose, capillary     Status: Abnormal   Collection Time: 08/05/22  8:06 PM  Result Value Ref Range   Glucose-Capillary 202 (H) 70 - 99 mg/dL    Comment: Glucose reference range applies only to samples taken after fasting for at least 8 hours.  Glucose, capillary     Status: Abnormal   Collection Time: 08/06/22  5:50 AM  Result Value Ref Range   Glucose-Capillary 164 (H) 70 - 99 mg/dL    Comment: Glucose reference range applies only to samples taken after fasting for at least 8 hours.   Comment 1 Notify RN    Comment 2 Document in Chart   Glucose, capillary      Status: Abnormal   Collection Time: 08/06/22 12:09 PM  Result Value Ref Range   Glucose-Capillary 166 (H) 70 - 99 mg/dL    Comment: Glucose reference range applies only to samples taken after fasting for at least 8 hours.    Blood Alcohol level:  Lab Results  Component Value Date   ETH <10 08/02/2022   ETH <10 02/11/2022    Metabolic Disorder Labs: Lab Results  Component Value Date   HGBA1C 12.5 (H) 02/11/2022   MPG 312.05 02/11/2022   MPG >398 12/09/2020   Lab Results  Component Value Date   PROLACTIN 11.9 02/11/2022   Lab Results  Component Value Date   CHOL 157 02/11/2022   TRIG 487 (H) 02/11/2022   HDL 23 (L) 02/11/2022   CHOLHDL 6.8 02/11/2022   VLDL UNABLE TO CALCULATE IF TRIGLYCERIDE OVER 400 mg/dL 16/10/960404/10/2022   LDLCALC UNABLE TO CALCULATE IF TRIGLYCERIDE OVER 400 mg/dL 54/09/811904/10/2022    Physical Findings: AIMS: Facial and Oral Movements Muscles of Facial Expression: None, normal Lips and Perioral Area: None, normal Jaw: None, normal Tongue: None, normal,Extremity Movements Upper (arms, wrists, hands, fingers): None, normal Lower (legs, knees, ankles, toes): None, normal, Trunk Movements Neck, shoulders, hips: None, normal, Overall Severity Severity of abnormal movements (highest score from questions above): None, normal Incapacitation due to abnormal movements: None, normal Patient's awareness of abnormal movements (rate only patient's report): No Awareness, Dental Status Current problems with teeth and/or dentures?: No Does patient usually wear dentures?: No  CIWA:    COWS:     Musculoskeletal: Strength & Muscle Tone: within normal limits Gait & Station: normal Patient leans: Right  Psychiatric Specialty Exam:  Presentation  General Appearance:  Appropriate for Environment; Casual  Eye Contact: Good  Speech: Clear and Coherent; Normal Rate  Speech Volume: Normal  Handedness: Ambidextrous   Mood and Affect  Mood: Anxious;  Depressed  Affect: Congruent   Thought Process  Thought Processes: Coherent; Linear; Goal Directed  Descriptions of Associations:Intact  Orientation:Full (Time, Place and Person)  Thought Content:Logical; WDL  History of Schizophrenia/Schizoaffective disorder:Yes  Duration of Psychotic Symptoms:No data recorded Hallucinations:Hallucinations: None  Ideas of Reference:None  Suicidal Thoughts:Suicidal Thoughts: Yes, Passive SI Passive Intent and/or Plan: Without Intent; Without Plan  Homicidal Thoughts:Homicidal Thoughts: No   Sensorium  Memory: Immediate Good; Recent Good; Remote Good  Judgment: Fair  Insight: Fair   Art therapist  Concentration: Good  Attention Span: Good  Recall: Good  Fund of Knowledge: Good  Language: Good   Psychomotor Activity  Psychomotor Activity: Psychomotor Activity: Normal   Assets  Assets: Communication Skills; Desire for Improvement; Leisure Time; Resilience; Physical Health   Sleep  Sleep: Sleep: Fair    Physical Exam: Physical Exam Psychiatric:        Attention and Perception: Attention and perception normal. She does not perceive auditory or visual hallucinations.        Mood and Affect: Affect normal. Mood is anxious and depressed.        Speech: Speech normal.        Behavior: Behavior normal. Behavior is cooperative.        Thought Content: Thought content is paranoid. Thought content is not delusional. Thought content includes suicidal ideation. Thought content does not include homicidal ideation. Thought content does not include suicidal plan.        Cognition and Memory: Cognition and memory normal.        Judgment: Judgment normal.    Review of Systems  Constitutional: Negative.   HENT: Negative.    Eyes: Negative.   Respiratory: Negative.    Cardiovascular: Negative.   Gastrointestinal: Negative.   Skin: Negative.   Neurological: Negative.   Psychiatric/Behavioral:  Positive for  depression and suicidal ideas (Patient endorses passive thoughts). Negative for hallucinations and substance abuse. The patient is nervous/anxious. The patient does not have insomnia.    Blood pressure 121/78, pulse 93, temperature 98.5 F (36.9 C), temperature source Oral, resp. rate 18, height 5\' 7"  (1.702 m), weight (!) 137.3 kg, SpO2 99 %. Body mass index is 47.41 kg/m.   Treatment Plan Summary: Daily contact with patient to assess and evaluate symptoms and progress in treatment and Medication management  Safety and Monitoring: Involuntary admission to inpatient psychiatric unit for safety, stabilization and treatment Daily contact with patient to assess and evaluate symptoms and progress in treatment Patient's case to be discussed in multi-disciplinary team meeting Observation Level : q15 minute checks Vital signs: q12 hours Precautions: safety     Diagnoses:  Principal Problem:   Schizoaffective disorder (HCC) Active Problems:   Generalized anxiety disorder   Insomnia     Schizoaffective disorder (bipolar type) -Patient is currently on Haloperidol Decanoate 100 mg/mL injection, provider to determine when her last injection was received Last injection was received 9/26, next haldol dec injection due 10/24 -Increase Prozac to 40 mg daily for management of depression   Generalized anxiety disorder -Continue Hydroxyzine 25 mg every 6 hours PRN   Insomnia -Start hydroxyzine 50 mg at bedtime as needed for insomnia -Stop trazodone 50 mg at bedtime due to patient experiencing nightmares from use   Continue home meds for diabetes, monitor FSBS and address accordingly. -Continue metformin 500 mg 2 times daily   Other PRNS -Start Tylenol 650 mg every 6 hours PRN for mild pain -Start Maalox 30 mg every 4 hrs PRN for indigestion -Start Milk of Magnesia as needed every 6 hrs for constipation   Patient was counseled regard  need to abstain completely from marijuana and cocaine  after dc.    Encouraged patient to attend group as a way to acquire new coping skills Provider to talk with social work about patient following up with primary care provider upon discharge No roommate order placed due to patient being unable to sleep comfortably at night   Discharge Planning: Social work and case management to assist with discharge planning and identification of hospital follow-up needs prior to discharge Discharge Concerns: Need to establish a safety plan; Medication compliance and effectiveness Discharge Goals: Return home with outpatient referrals for mental health follow-up including medication management/psychotherapy   I certify that inpatient services furnished can reasonably be expected to improve the patient's condition.   Malachy Mood, PA 08/06/2022, 4:05 PM

## 2022-08-06 NOTE — Progress Notes (Signed)
   08/06/22 2100  Psych Admission Type (Psych Patients Only)  Admission Status Involuntary  Psychosocial Assessment  Patient Complaints Anxiety;Depression  Eye Contact Fair  Facial Expression Anxious  Affect Depressed  Speech Logical/coherent  Interaction Assertive  Motor Activity Fidgety  Appearance/Hygiene Unremarkable  Behavior Characteristics Cooperative  Mood Depressed;Pleasant  Aggressive Behavior  Effect No apparent injury  Thought Process  Coherency WDL  Content WDL  Delusions WDL  Perception WDL  Hallucination None reported or observed  Judgment WDL  Confusion WDL  Danger to Self  Current suicidal ideation? Passive  Danger to Others  Danger to Others None reported or observed

## 2022-08-06 NOTE — Group Note (Signed)
Date:  08/06/2022 Time:  11:28 AM  Group Topic/Focus:  Orientation:   The focus of this group is to educate the patient on the purpose and policies of crisis stabilization and provide a format to answer questions about their admission.  The group details unit policies and expectations of patients while admitted.    Participation Level:  Active  Participation Quality:  Appropriate  Affect:  Appropriate  Cognitive:  Appropriate  Insight: Appropriate  Engagement in Group:  Engaged  Modes of Intervention:  Discussion  Additional Comments:     Jerrye Beavers 08/06/2022, 11:28 AM

## 2022-08-07 LAB — GLUCOSE, CAPILLARY
Glucose-Capillary: 190 mg/dL — ABNORMAL HIGH (ref 70–99)
Glucose-Capillary: 168 mg/dL — ABNORMAL HIGH (ref 70–99)
Glucose-Capillary: 263 mg/dL — ABNORMAL HIGH (ref 70–99)
Glucose-Capillary: 218 mg/dL — ABNORMAL HIGH (ref 70–99)

## 2022-08-07 NOTE — Group Note (Signed)
LCSW Group Therapy Note   Group Date: 08/07/2022 Start Time: 1300 End Time: 1400   Type of Therapy and Topic:  Group Therapy: Boundaries  Participation Level:  Minimal  Description of Group: This group will address the use of boundaries in their personal lives. Patients will explore why boundaries are important, the difference between healthy and unhealthy boundaries, and negative and postive outcomes of different boundaries and will look at how boundaries can be crossed.  Patients will be encouraged to identify current boundaries in their own lives and identify what kind of boundary is being set. Facilitators will guide patients in utilizing problem-solving interventions to address and correct types boundaries being used and to address when no boundary is being used. Understanding and applying boundaries will be explored and addressed for obtaining and maintaining a balanced life. Patients will be encouraged to explore ways to assertively make their boundaries and needs known to significant others in their lives, using other group members and facilitator for role play, support, and feedback.  Therapeutic Goals:  1.  Patient will identify areas in their life where setting clear boundaries could be  used to improve their life.  2.  Patient will identify signs/triggers that a boundary is not being respected. 3.  Patient will identify two ways to set boundaries in order to achieve balance in  their lives: 4.  Patient will demonstrate ability to communicate their needs and set boundaries  through discussion and/or role plays  Summary of Patient Progress:  Cindy Leonard was minimally present/active throughout the session and proved open to feedback from [place] and peers. Patient demonstrated little insight into the subject matter, was respectful of peers, and was present throughout the entire session. .  Therapeutic Modalities:   Cognitive Behavioral Therapy Solution-Focused Therapy  Windle Guard,  LCSW 08/07/2022  2:39 PM

## 2022-08-07 NOTE — Group Note (Signed)
Recreation Therapy Group Note   Group Topic:Team Building  Group Date: 08/07/2022 Start Time: 0930 End Time: 1000 Facilitators: Garrette Caine-McCall, LRT,CTRS Location: [address] Dayroom   Goal Area(s) Addresses:  Patient will effectively work with peer towards shared goal.  Patient will identify skills used to make activity successful.  Patient will identify how skills used during activity can be used to reach post d/c goals.    Group Description: Landing Pad. In teams of 3-5, patients were given 12 plastic drinking straws and an equal length of masking tape. Using the materials provided, patients were asked to build a landing pad to catch a golf ball dropped from approximately 5 feet in the air. All materials were required to be used by the team in their design. LRT facilitated post-activity discussion.   Affect/Mood: N/A   Participation Level: Did not attend    Clinical Observations/Individualized Feedback:     Plan: Continue to engage patient in RT group sessions 2-3x/week.   Treonna Klee-McCall, LRT,CTRS  08/07/2022 12:30 PM

## 2022-08-07 NOTE — Plan of Care (Signed)
Pt alert, verbal and able to make needs known. Patient has been ambulating around the unit this shift, talking with peers and staff. Patient reports she feels a little better and she is trying to stay out of her room as much as possible. Denies SI/HI/AVH today. Denies any physical pain. Compliant with medications.    Problem: Education: Goal: Knowledge of General Education information will improve Description: Including pain rating scale, medication(s)/side effects and non-pharmacologic comfort measures Outcome: Progressing   Problem: Health Behavior/Discharge Planning: Goal: Ability to manage health-related needs will improve Outcome: Progressing   Problem: Clinical Measurements: Goal: Ability to maintain clinical measurements within normal limits will improve Outcome: Progressing   Problem: Activity: Goal: Risk for activity intolerance will decrease Outcome: Progressing   Problem: Nutrition: Goal: Adequate nutrition will be maintained Outcome: Progressing   Problem: Coping: Goal: Level of anxiety will decrease Outcome: Progressing

## 2022-08-07 NOTE — Progress Notes (Signed)
   08/07/22 2212  Psych Admission Type (Psych Patients Only)  Admission Status Involuntary  Psychosocial Assessment  Patient Complaints Depression  Eye Contact Fair  Facial Expression Anxious  Affect Appropriate to circumstance  Speech Logical/coherent  Interaction Assertive  Motor Activity Other (Comment) (WDL)  Appearance/Hygiene Disheveled  Behavior Characteristics Appropriate to situation  Mood Pleasant  Thought Process  Coherency WDL  Content WDL  Delusions None reported or observed  Perception WDL  Hallucination None reported or observed  Judgment WDL  Confusion None  Danger to Self  Current suicidal ideation? Denies  Self-Injurious Behavior No self-injurious ideation or behavior indicators observed or expressed   Agreement Not to Harm Self Yes  Description of Agreement verbal  Danger to Others  Danger to Others None reported or observed

## 2022-08-07 NOTE — Progress Notes (Signed)
   08/07/22 2212  Psych Admission Type (Psych Patients Only)  Admission Status Involuntary  Psychosocial Assessment  Patient Complaints Depression  Eye Contact Fair  Facial Expression Anxious  Affect Appropriate to circumstance  Speech Logical/coherent  Interaction Assertive  Motor Activity Other (Comment) (WDL)  Appearance/Hygiene Disheveled  Behavior Characteristics Appropriate to situation  Mood Pleasant  Thought Process  Coherency WDL  Content WDL  Delusions None reported or observed  Perception WDL  Hallucination None reported or observed  Judgment WDL  Confusion None  Danger to Self  Current suicidal ideation? Denies  Self-Injurious Behavior No self-injurious ideation or behavior indicators observed or expressed   Agreement Not to Harm Self Yes  Description of Agreement verbal  Danger to Others  Danger to Others None reported or observed    

## 2022-08-07 NOTE — Progress Notes (Signed)
   08/07/22 0545  Sleep  Number of Hours 10

## 2022-08-07 NOTE — Progress Notes (Signed)
[facility] MD Progress Note  08/07/2022 11:47 AM Cindy Leonard  MRN:  [id number] Subjective:    Cindy Leonard is a 26 year old, African-American female with a past psychiatric history significant for schizoaffective disorder (bipolar type), insomnia, and generalized anxiety disorder who was recently involuntarily admitted to [facility] after presenting to [facility] ED due to worsening depressive symptoms and suicidal ideation.   Patient was seen by Dominga Ferry for reevaluation.  Patient is currently being managed on the following psychiatric medications:   Prozac 40 mg daily Patient also receives Haldol injection monthly, last injection received on 07/28/2022.  Next injection due 08/25/2022.  Patient reports that she was knocked out for 2 hours after taking her 40 mg of Prozac.  Although patient states that the medication knocked her out, she states that she feels energized and not as tired.  Patient continues to endorse some depression stating that she broke down yesterday over being tired of reaching out for help from her family.  Patient depressive symptoms are characterized by feelings of sadness and crying spells.  Patient endorses anxiety and rates her anxiety a 4 or 5 out of 10.  Patient continues to endorse passive suicidal ideations but was able to contract for safety.  She states that her passive suicidal ideations are less severe than when she was admitted to this facility.  Patient denies homicidal ideations and further denies auditory or visual hallucinations and does not appear to be responding to internal/external stimuli.  Patient denies paranoia and further denies delusional thoughts.  Patient endorses fair sleep stating that she did not receive as much sleep the night before.  Patient was encouraged to take hydroxyzine 50 mg at bedtime as needed for the management of her sleep.  Patient endorses fair appetite stating that she eats at least 2 meals a [place].  Patient has  been attending group without issue.  Patient expresses a desire to be discharged from this facility.  Principal Problem: Schizoaffective disorder (HCC) Diagnosis: Principal Problem:   Schizoaffective disorder (HCC) Active Problems:   Generalized anxiety disorder   Insomnia  Total Time spent with patient: 15 minutes  Past Psychiatric History:  Schizoaffective disorder, bipolar type Anxiety Insomnia  Past Medical History:  Past Medical History:  Diagnosis Date   Bipolar 1 disorder (HCC)    Depression    Diabetes mellitus without complication (HCC)    Schizophrenia (HCC)     Past Surgical History:  Procedure Laterality Date   INCISION AND DRAINAGE ABSCESS Left 12/08/2020   Procedure: INCISION AND DRAINAGE POSTERIOR NECK ABSCESS;  Surgeon: Gaynelle Adu, MD;  Location: [facility] OR;  Service: General;  Laterality: Left;   NO PAST SURGERIES     Family History:  Family History  Problem Relation Age of Onset   Stroke Mother    Diabetes Other    Hypertension Other    Asthma Father    Asthma Brother    Family Psychiatric  History:  Mother (maternal) - patient is  unsure of psychiatric issues Aunt (maternal) - past history of suicide attempt  Social History:  Social History   Substance and Sexual Activity  Alcohol Use No     Social History   Substance and Sexual Activity  Drug Use Yes   Types: Marijuana   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  Marijuana use   Sexual activity: Yes    Birth control/protection: None  Other Topics Concern   Not on file  Social History Narrative   Not on file   Social Determinants of Health   Financial Resource Strain: Not on file  Food Insecurity: No Food Insecurity (08/04/2022)   Hunger Vital Sign    Worried About Running Out of Food in the Last Year: Never true    Ran Out of Food in the Last Year: Never true  Transportation Needs: Unknown (08/04/2022)   PRAPARE - Hydrologist (Medical): Patient refused    Lack of Transportation (Non-Medical): Patient refused  Physical Activity: Not on file  Stress: Not on file  Social Connections: Not on file   Additional Social History:                         Sleep: Fair  Appetite:  Fair  Current Medications: Current Facility-Administered Medications  Medication Dose Route Frequency Provider Last Rate Last Admin   acetaminophen (TYLENOL) tablet 650 mg  650 mg Oral Q6H PRN Vesta Mixer, NP       alum & mag hydroxide-simeth (MAALOX/MYLANTA) 200-200-20 MG/5ML suspension 30 mL  30 mL Oral Q4H PRN Vesta Mixer, NP       FLUoxetine (PROZAC) capsule 40 mg  40 mg Oral Daily Evin Loiseau E, PA   40 mg at 08/07/22 0756   hydrOXYzine (ATARAX) tablet 10 mg  10 mg Oral TID PRN Vesta Mixer, NP       insulin aspart (novoLOG) injection 0-15 Units  0-15 Units Subcutaneous TID WC Vesta Mixer, NP   3 Units at 08/07/22 0614   insulin aspart (novoLOG) injection 0-5 Units  0-5 Units Subcutaneous QHS Vesta Mixer, NP   2 Units at 08/04/22 2106   insulin glargine-yfgn (SEMGLEE) injection 60 Units  60 Units Subcutaneous QHS Massengill, Ovid Curd, MD   60 Units at 08/06/22 2104   metFORMIN (GLUCOPHAGE) tablet 500 mg  500 mg Oral BID WC Azelie Noguera E, PA   500 mg at 08/07/22 0756   traZODone (DESYREL) tablet 50 mg  50 mg Oral QHS PRN Vesta Mixer, NP   50 mg at 08/04/22 2104    Lab Results:  Results for  orders placed or performed during the hospital encounter of 08/04/22 (from the past 48 hour(s))  Glucose, capillary     Status: Abnormal   Collection Time: 08/05/22 12:11 PM  Result Value Ref Range   Glucose-Capillary 114 (H) 70 - 99 mg/dL    Comment: Glucose reference range applies only to samples taken after fasting for at least 8 hours.  Glucose, capillary     Status: Abnormal   Collection Time: 08/05/22  5:00 PM  Result Value Ref Range   Glucose-Capillary 208 (H) 70 - 99 mg/dL    Comment: Glucose reference range applies only to samples taken after fasting for at least 8 hours.  Glucose, capillary     Status: Abnormal   Collection Time: 08/05/22  8:06 PM  Result Value Ref Range   Glucose-Capillary 202 (H) 70 - 99 mg/dL    Comment: Glucose reference range applies only to samples taken after fasting for at least 8 hours.  Glucose, capillary     Status: Abnormal   Collection Time: 08/06/22  5:50 AM  Result Value Ref Range   Glucose-Capillary 164 (H) 70 - 99 mg/dL    Comment: Glucose reference range applies only to samples taken after fasting for at least 8 hours.   Comment 1 Notify RN    Comment 2 Document in Chart   Glucose, capillary     Status: Abnormal   Collection Time: 08/06/22 12:09 PM  Result Value Ref Range   Glucose-Capillary 166 (H) 70 - 99 mg/dL    Comment: Glucose reference range applies only to samples taken after fasting for at least 8 hours.  Glucose, capillary     Status: Abnormal   Collection Time: 08/06/22  4:25 PM  Result Value Ref Range   Glucose-Capillary 150 (H) 70 - 99 mg/dL    Comment: Glucose reference range applies only to samples taken after fasting for at least 8 hours.  CBC with Differential/Platelet     Status: Abnormal   Collection Time: 08/06/22  6:40 PM  Result Value Ref Range   WBC 11.1 (H) 4.0 - 10.5 K/uL   RBC 4.90 3.87 - 5.11 MIL/uL   Hemoglobin 13.7 12.0 - 15.0 g/dL   HCT 02.7 25.3 - 66.4 %   MCV 85.7 80.0 - 100.0 fL   MCH 28.0 26.0 -  34.0 pg   MCHC 32.6 30.0 - 36.0 g/dL   RDW 40.3 47.4 - 25.9 %   Platelets 271 150 - 400 K/uL   nRBC 0.0 0.0 - 0.2 %   Neutrophils Relative % 52 %   Neutro Abs 5.8 1.7 - 7.7 K/uL   Lymphocytes Relative 39 %   Lymphs Abs 4.3 (H) 0.7 - 4.0 K/uL   Monocytes Relative 6 %   Monocytes Absolute 0.7 0.1 - 1.0 K/uL   Eosinophils Relative 2 %   Eosinophils Absolute 0.3 0.0 - 0.5 K/uL   Basophils Relative 1 %   Basophils Absolute 0.1 0.0 - 0.1 K/uL   Immature Granulocytes 0 %   Abs Immature Granulocytes 0.02 0.00 - 0.07 K/uL    Comment: Performed at [facility], [address], [place], [place] [address]  Comprehensive metabolic panel     Status: Abnormal   Collection Time: 08/06/22  6:40 PM  Result Value Ref Range   Sodium 138 135 - 145 mmol/L   Potassium 3.8 3.5 - 5.1 mmol/L   Chloride 106 98 - 111 mmol/L   CO2 27 22 - 32 mmol/L   Glucose, Bld 159 (H) 70 - 99 mg/dL    Comment: Glucose reference range applies only to samples taken after fasting for at least 8 hours.   BUN 14 6 - 20 mg/dL   Creatinine, Ser 5.64 (H) 0.44 - 1.00 mg/dL   Calcium 9.2 8.9 - 33.2 mg/dL   Total Protein 7.1 6.5 - 8.1 g/dL   Albumin 3.6 3.5 - 5.0 g/dL   AST 15 15 - 41 U/L   ALT 21 0 - 44 U/L   Alkaline Phosphatase 63 38 - 126 U/L   Total Bilirubin 0.3 0.3 - 1.2 mg/dL   GFR, Estimated >95 >18 mL/min    Comment: (  NOTE) Calculated using the CKD-EPI Creatinine Equation (2021)    Anion gap 5 5 - 15    Comment: Performed at [facility], [address] [place], [place] [address]  Lipid panel     Status: Abnormal   Collection Time: 08/06/22  6:40 PM  Result Value Ref Range   Cholesterol 136 0 - 200 mg/dL   Triglycerides 604 (H) <150 mg/dL   HDL 23 (L) >54 mg/dL   Total CHOL/HDL Ratio 5.9 RATIO   VLDL 53 (H) 0 - 40 mg/dL   LDL Cholesterol 60 0 - 99 mg/dL    Comment:        Total Cholesterol/HDL:CHD Risk Coronary Heart Disease Risk Table                     Men    Women  1/2 Average Risk   3.4   3.3  Average Risk       5.0   4.4  2 X Average Risk   9.6   7.1  3 X Average Risk  23.4   11.0        Use the calculated Patient Ratio above and the CHD Risk Table to determine the patient's CHD Risk.        ATP III CLASSIFICATION (LDL):  <100     mg/dL   Optimal  098-119  mg/dL   Near or Above                    Optimal  130-159  mg/dL   Borderline  147-829  mg/dL   High  >562     mg/dL   Very High Performed at [facility], [address], [place], [place] [address]   Hemoglobin A1c     Status: Abnormal   Collection Time: 08/06/22  6:40 PM  Result Value Ref Range   Hgb A1c MFr Bld 11.5 (H) 4.8 - 5.6 %    Comment: (NOTE) Pre diabetes:          5.7%-6.4%  Diabetes:              >6.4%  Glycemic control for   <7.0% adults with diabetes    Mean Plasma Glucose 283.35 mg/dL    Comment: Performed at [facility], [address], [place], [place] [address]  Glucose, capillary     Status: Abnormal   Collection Time: 08/06/22  7:36 PM  Result Value Ref Range   Glucose-Capillary 175 (H) 70 - 99 mg/dL    Comment: Glucose reference range applies only to samples taken after fasting for at least 8 hours.  Glucose, capillary     Status: Abnormal   Collection Time: 08/07/22  6:03 AM  Result Value Ref Range   Glucose-Capillary 190 (H) 70 - 99 mg/dL    Comment: Glucose reference range applies only to samples taken after fasting for at least 8 hours.    Blood Alcohol level:  Lab Results  Component Value Date   ETH <10 08/02/2022   ETH <10 02/11/2022    Metabolic Disorder Labs: Lab Results  Component Value Date   HGBA1C 11.5 (H) 08/06/2022   MPG 283.35 08/06/2022   MPG 312.05 02/11/2022   Lab Results  Component Value Date   PROLACTIN 11.9 02/11/2022   Lab Results  Component Value Date   CHOL 136 08/06/2022   TRIG 266 (H) 08/06/2022   HDL 23 (L) 08/06/2022   CHOLHDL 5.9 08/06/2022  VLDL 53 (H) 08/06/2022    LDLCALC 60 08/06/2022   LDLCALC UNABLE TO CALCULATE IF TRIGLYCERIDE OVER 400 mg/dL 16/10/960404/10/2022    Physical Findings: AIMS: Facial and Oral Movements Muscles of Facial Expression: None, normal Lips and Perioral Area: None, normal Jaw: None, normal Tongue: None, normal,Extremity Movements Upper (arms, wrists, hands, fingers): None, normal Lower (legs, knees, ankles, toes): None, normal, Trunk Movements Neck, shoulders, hips: None, normal, Overall Severity Severity of abnormal movements (highest score from questions above): None, normal Incapacitation due to abnormal movements: None, normal Patient's awareness of abnormal movements (rate only patient's report): No Awareness, Dental Status Current problems with teeth and/or dentures?: No Does patient usually wear dentures?: No  CIWA:    COWS:     Musculoskeletal: Strength & Muscle Tone: within normal limits Gait & Station: normal Patient leans: N/A  Psychiatric Specialty Exam:  Presentation  General Appearance:  Appropriate for Environment; Casual  Eye Contact: Good  Speech: Clear and Coherent; Normal Rate  Speech Volume: Normal  Handedness: Ambidextrous   Mood and Affect  Mood: Anxious; Depressed  Affect: Congruent   Thought Process  Thought Processes: Coherent; Goal Directed; Linear  Descriptions of Associations:Intact  Orientation:Full (Time, Place and Person)  Thought Content:Logical; WDL  History of Schizophrenia/Schizoaffective disorder:Yes  Duration of Psychotic Symptoms:No data recorded Hallucinations:Hallucinations: None  Ideas of Reference:None  Suicidal Thoughts:Suicidal Thoughts: Yes, Passive SI Passive Intent and/or Plan: Without Intent; Without Plan  Homicidal Thoughts:Homicidal Thoughts: No   Sensorium  Memory: Immediate Good; Recent Good; Remote Good  Judgment: Fair  Insight: Fair   Art therapistxecutive Functions  Concentration: Good  Attention  Span: Good  Recall: Good  Fund of Knowledge: Good  Language: Good   Psychomotor Activity  Psychomotor Activity: Psychomotor Activity: Normal   Assets  Assets: Communication Skills; Desire for Improvement; Leisure Time; Resilience; Physical Health   Sleep  Sleep: Sleep: Fair    Physical Exam: Physical Exam Psychiatric:        Attention and Perception: Attention normal. She perceives auditory hallucinations. She does not perceive visual hallucinations.        Mood and Affect: Affect normal. Mood is anxious and depressed.        Speech: Speech normal.        Behavior: Behavior normal. Behavior is cooperative.        Thought Content: Thought content is not paranoid or delusional. Thought content includes suicidal ideation. Thought content does not include homicidal ideation. Thought content does not include suicidal plan.        Cognition and Memory: Cognition and memory normal.        Judgment: Judgment normal.    Review of Systems  Constitutional: Negative.   HENT: Negative.    Eyes: Negative.   Respiratory: Negative.    Cardiovascular: Negative.   Gastrointestinal: Negative.   Skin: Negative.   Neurological: Negative.   Psychiatric/Behavioral:  Positive for depression and suicidal ideas (Patient endorses passive suicidal ideations). Negative for hallucinations and substance abuse. The patient is nervous/anxious. The patient does not have insomnia.    Blood pressure (!) 118/57, pulse 72, temperature 98.4 F (36.9 C), temperature source Oral, resp. rate 18, height 5\' 7"  (1.702 m), weight (!) 137.3 kg, SpO2 99 %. Body mass index is 47.41 kg/m.   Treatment Plan Summary:  Daily contact with patient to assess and evaluate symptoms and progress in treatment and Medication management  Safety and Monitoring: Involuntary admission to inpatient psychiatric unit for safety, stabilization and treatment Daily contact with patient  to assess and evaluate symptoms and  progress in treatment Patient's case to be discussed in multi-disciplinary team meeting Observation Level : q15 minute checks Vital signs: q12 hours Precautions: safety     Diagnoses:  Principal Problem:   Schizoaffective disorder (HCC) Active Problems:   Generalized anxiety disorder   Insomnia     Schizoaffective disorder (bipolar type) -Patient is currently on Haloperidol Decanoate 100 mg/mL injection, provider to determine when her last injection was received Last injection was received 9/26, next haldol dec injection due 10/24 -Continue Prozac 40 mg daily for management of depression   Generalized anxiety disorder -Continue hydroxyzine 25 mg every 6 hours PRN   Insomnia -Continue hydroxyzine 50 mg at bedtime as needed for insomnia -Stop trazodone 50 mg at bedtime due to patient experiencing nightmares from use   Continue home meds for diabetes, monitor FSBS and address accordingly. -Continue metformin 500 mg 2 times daily   Other PRNS -Continue Tylenol 650 mg every 6 hours PRN for mild pain -Continue Maalox 30 mg every 4 hrs PRN for indigestion -Continue Milk of Magnesia as needed every 6 hrs for constipation   Patient was counseled regarding need to abstain completely from marijuana and cocaine after dc.    Encouraged patient to attend group as a way to acquire new coping skills Provider to talk with social work about patient following up with primary care provider upon discharge No roommate order placed due to patient being unable to sleep comfortably at night   Discharge Planning: Social work and case management to assist with discharge planning and identification of hospital follow-up needs prior to discharge Discharge Concerns: Need to establish a safety plan; Medication compliance and effectiveness Discharge Goals: Return home with outpatient referrals for mental health follow-up including medication management/psychotherapy   I certify that inpatient services  furnished can reasonably be expected to improve the patient's condition.  Meta Hatchet, PA 08/07/2022, 11:47 AM

## 2022-08-08 LAB — GLUCOSE, CAPILLARY
Glucose-Capillary: 182 mg/dL — ABNORMAL HIGH (ref 70–99)
Glucose-Capillary: 225 mg/dL — ABNORMAL HIGH (ref 70–99)
Glucose-Capillary: 178 mg/dL — ABNORMAL HIGH (ref 70–99)
Glucose-Capillary: 149 mg/dL — ABNORMAL HIGH (ref 70–99)
Glucose-Capillary: 159 mg/dL — ABNORMAL HIGH (ref 70–99)

## 2022-08-08 MED ORDER — METFORMIN HCL ER 500 MG PO TB24
500.0000 mg | ORAL_TABLET | Freq: Two times a day (BID) | ORAL | Status: DC
  Administered 2022-08-08 – 2022-08-11 (×6): 500 mg via ORAL
  Filled 2022-08-08 (×10): qty 1

## 2022-08-08 NOTE — Progress Notes (Signed)
Adult Psychoeducational Group Note  Date:  08/08/2022 Time:  8:57 PM  Group Topic/Focus:  Wrap-Up Group:   The focus of this group is to help patients review their daily goal of treatment and discuss progress on daily workbooks.  Participation Level:  Active  Participation Quality:  Appropriate  Affect:  Appropriate  Cognitive:  Appropriate  Insight: Appropriate  Engagement in Group:  Engaged  Modes of Intervention:  Education and Exploration  Additional Comments:  Patient attended and participated in group tonight. She reports that today she learn coping skill to control her anger.  Salley Scarlet [facility] 08/08/2022, 8:57 PM

## 2022-08-08 NOTE — Progress Notes (Signed)
   08/08/22 2147  Psych Admission Type (Psych Patients Only)  Admission Status Involuntary  Psychosocial Assessment  Patient Complaints Depression  Eye Contact Fair  Facial Expression Anxious  Affect Appropriate to circumstance  Speech Logical/coherent  Interaction Assertive  Motor Activity Fidgety  Appearance/Hygiene Unremarkable  Behavior Characteristics Appropriate to situation  Mood Pleasant  Thought Process  Coherency WDL  Content WDL  Delusions None reported or observed  Perception WDL  Hallucination None reported or observed  Judgment WDL  Confusion None  Danger to Self  Current suicidal ideation? Denies  Self-Injurious Behavior No self-injurious ideation or behavior indicators observed or expressed   Agreement Not to Harm Self Yes  Description of Agreement verbal  Danger to Others  Danger to Others None reported or observed

## 2022-08-08 NOTE — [facility] Group Notes (Signed)
.  Psychoeducational Group Note    Date:  08/08/2022 Time: 1300-1400    Purpose of Group: . The group focus' on teaching patients on how to identify their needs and their Life Skills:  A group where two lists are made. What people need and what are things that we do that are unhealthy. The lists are developed by the patients and it is explained that we often do the actions that are not healthy to get our list of needs met.  Goal:: to develop the coping skills needed to get their needs met  Participation Level:  Pt was in bed asleep and this Probation officer entered. Encouraged Pt to coime to the group. Pt refused to come to the group.Stating  that "I went to 3 groups today. I am not going to anymore"  Paulino Rily

## 2022-08-08 NOTE — Group Note (Signed)
LCSW Group Therapy Note  08/08/2022    10:00-11:00am   Type of Therapy and Topic:  Group Therapy: Early Messages Received About Anger  Participation Level:  Active   Description of Group:   In this group, patients shared and discussed the early messages received in their lives about anger through parental or other adult modeling, teaching, repression, punishment, violence, and more.  Participants identified how those childhood lessons influence even now how they usually or often react when angered.  The group discussed that anger is a secondary emotion and what may be the underlying emotional themes that come out through anger outbursts or that are ignored through anger suppression.    Therapeutic Goals: Patients will identify one or more childhood message about anger that they received and how it was taught to them. Patients will discuss how these childhood experiences have influenced and continue to influence their own expression or repression of anger even today. Patients will explore possible primary emotions that tend to fuel their secondary emotion of anger. Patients will learn that anger itself is normal and cannot be eliminated, and that healthier coping skills can assist with resolving conflict rather than worsening situations.  Summary of Patient Progress:  The patient shared that their childhood lessons about anger were "everything doesn't need a reaction".  As a result, "I learned not to react to smaller things".  The patient participated fully and demonstrated insight.  Therapeutic Modalities:   Cognitive Behavioral Therapy Motivation Interviewing  [place], [place] 08/08/2022 10:46 AM

## 2022-08-08 NOTE — Plan of Care (Signed)
Alert, verbal and able to make needs known. Patient has been out on the unit this morning, participating in groups and conversating with peers. Patient reports she slept well last night but her appetite has been poor. Patient reports her depression as 5/10, hopelessness 5/10 and anxiety 5/10. Contracts for safety, reports no physical pain today, takes her medications as prescribed. Denies SI/HI/AVH.    Problem: Education: Goal: Knowledge of General Education information will improve Description: Including pain rating scale, medication(s)/side effects and non-pharmacologic comfort measures Outcome: Progressing   Problem: Health Behavior/Discharge Planning: Goal: Ability to manage health-related needs will improve Outcome: Progressing   Problem: Clinical Measurements: Goal: Ability to maintain clinical measurements within normal limits will improve Outcome: Progressing   Problem: Coping: Goal: Level of anxiety will decrease Outcome: Progressing   Problem: Safety: Goal: Ability to remain free from injury will improve Outcome: Progressing   Problem: Education: Goal: Utilization of techniques to improve thought processes will improve Outcome: Progressing Goal: Knowledge of the prescribed therapeutic regimen will improve Outcome: Progressing   Problem: Activity: Goal: Interest or engagement in leisure activities will improve Outcome: Progressing Goal: Imbalance in normal sleep/wake cycle will improve Outcome: Progressing   Problem: Coping: Goal: Coping ability will improve Outcome: Progressing

## 2022-08-08 NOTE — Progress Notes (Signed)
[facility] MD Progress Note  08/08/2022 1:48 PM Cindy Leonard  MRN:  [id number] Subjective:   HPI:  Cindy Leonard. Mcgranahan is a 27 year old, African-American female with a past psychiatric history significant for schizoaffective disorder (bipolar type), insomnia, and generalized anxiety disorder who was recently involuntarily admitted to [facility] after presenting to [facility] ED due to worsening depressive symptoms and suicidal ideation.  Assessment:  Patient observed visible in milieu, participating in unit group activities and interacting with peers/staff appropriately. She presents resting in her room after what she describes as "3 groups back to back, I need a break". She is alert and oriented. She reports her mood as "good. Everything is going good". Affect congruent. Speech appropriate. Thought content linear and logical. She reports intermittent feelings of hopelessness that she describes as "passive" and not suicidal ideations but "wants my problems to go away". She denies wanting to die or having any intent or plan for suicide. She is denying any thoughts of wanting to harm others, auditory or visual hallucinations. She presents calm and cooperative with no signs of any responding to external/internal stimuli. She reports "alright" appetite, low energy related to some diarrhea from Metformin, "functional" concentration, no feelings of guilt or worthlessness, and denies any active suicidal ideations. She reports having x2 nightmares this week that she attributes to Trazodone and is requesting to not take it. Provider discussed relationship with carbohydrates and diarrhea when taking Metformin; patient was engaged and verbalized an understanding during conversation. She reports ongoing communication with her mother that she reports is "okay"; states plan to reach out to [facility] care coordinator to discuss moving out of mother's home. Says she is looking forward to having "good spirit". She  contracts for safety and expressed looking forward to discharge this week. Medication adjustments noted below.   Principal Problem: Schizoaffective disorder (HCC) Diagnosis: Principal Problem:   Schizoaffective disorder (HCC) Active Problems:   Generalized anxiety disorder   Insomnia  Total Time spent with patient: 15 minutes  Past Psychiatric History:  Schizoaffective disorder, bipolar type Anxiety Insomnia  Past Medical History:  Past Medical History:  Diagnosis Date   Bipolar 1 disorder (HCC)    Depression    Diabetes mellitus without complication (HCC)    Schizophrenia (HCC)     Past Surgical History:  Procedure Laterality Date   INCISION AND DRAINAGE ABSCESS Left 12/08/2020   Procedure: INCISION AND DRAINAGE POSTERIOR NECK ABSCESS;  Surgeon: Gaynelle Adu, MD;  Location: [facility] OR;  Service: General;  Laterality: Left;   NO PAST SURGERIES     Family History:  Family History  Problem Relation Age of Onset   Stroke Mother    Diabetes Other    Hypertension Other    Asthma Father    Asthma Brother    Family Psychiatric  History:  Mother (maternal) - patient is  unsure of psychiatric issues Aunt (maternal) - past history of suicide attempt  Social History:  Social History   Substance and Sexual Activity  Alcohol Use No     Social History   Substance and Sexual Activity  Drug Use Yes   Types: Marijuana   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 Daily Marijuana use   Sexual activity: Yes    Birth control/protection: None  Other Topics Concern   Not on file  Social History Narrative   Not on file   Social Determinants of Health   Financial Resource Strain: Not on file  Food Insecurity: No Food Insecurity (08/04/2022)   Hunger Vital Sign    Worried About Running Out of Food in the Last Year: Never true    Ran Out of Food in the Last Year: Never true  Transportation Needs: Unknown (08/04/2022)   PRAPARE - Administrator, Civil Service (Medical): Patient refused    Lack of Transportation (Non-Medical): Patient refused  Physical Activity: Not on file  Stress: Not on file  Social Connections: Not on file   Additional Social History:  -currently lives with mother -[facility] coordination services   Sleep: Fair  Appetite:  Fair  Current Medications: Current Facility-Administered Medications  Medication Dose Route Frequency Provider Last Rate Last Admin   acetaminophen (TYLENOL) tablet 650 mg  650 mg Oral Q6H PRN Eligha Bridegroom, NP       alum & mag hydroxide-simeth (MAALOX/MYLANTA) 200-200-20 MG/5ML suspension 30 mL  30 mL Oral Q4H PRN Eligha Bridegroom, NP       FLUoxetine (PROZAC) capsule 40 mg  40 mg Oral Daily Nwoko, Uchenna E, PA   40 mg at 08/08/22 0745   hydrOXYzine (ATARAX) tablet 10 mg  10 mg Oral TID PRN Eligha Bridegroom, NP       insulin aspart (novoLOG) injection 0-15 Units  0-15 Units Subcutaneous TID WC Eligha Bridegroom, NP   3 Units at 08/08/22 1211   insulin aspart (novoLOG) injection 0-5 Units  0-5 Units Subcutaneous QHS Eligha Bridegroom, NP   2 Units at 08/07/22 2106   insulin glargine-yfgn (SEMGLEE) injection 60 Units  60 Units Subcutaneous QHS Massengill, Harrold Donath, MD   60 Units at 08/07/22 2106   metFORMIN (GLUCOPHAGE) tablet 500 mg  500 mg Oral BID WC Nwoko, Uchenna E, PA   500 mg at 08/08/22 0745   traZODone (DESYREL) tablet 50 mg  50 mg Oral QHS PRN Eligha Bridegroom, NP   50 mg at  08/04/22 2104    Lab Results:  Results for orders placed or performed during the hospital encounter of 08/04/22 (from the past 48 hour(s))  Glucose, capillary     Status: Abnormal   Collection Time: 08/06/22  4:25 PM  Result Value Ref Range   Glucose-Capillary 150 (H) 70 - 99 mg/dL    Comment: Glucose reference range applies only to samples taken after fasting for at least 8 hours.  CBC with Differential/Platelet     Status: Abnormal   Collection Time: 08/06/22  6:40 PM  Result Value Ref Range   WBC 11.1 (H) 4.0 - 10.5 K/uL   RBC 4.90 3.87 - 5.11 MIL/uL   Hemoglobin 13.7 12.0 - 15.0 g/dL   HCT 69.6 29.5 - 28.4 %   MCV 85.7 80.0 - 100.0 fL   MCH 28.0 26.0 - 34.0 pg   MCHC 32.6 30.0 - 36.0 g/dL   RDW 13.2 44.0 -  15.5 %   Platelets 271 150 - 400 K/uL   nRBC 0.0 0.0 - 0.2 %   Neutrophils Relative % 52 %   Neutro Abs 5.8 1.7 - 7.7 K/uL   Lymphocytes Relative 39 %   Lymphs Abs 4.3 (H) 0.7 - 4.0 K/uL   Monocytes Relative 6 %   Monocytes Absolute 0.7 0.1 - 1.0 K/uL   Eosinophils Relative 2 %   Eosinophils Absolute 0.3 0.0 - 0.5 K/uL   Basophils Relative 1 %   Basophils Absolute 0.1 0.0 - 0.1 K/uL   Immature Granulocytes 0 %   Abs Immature Granulocytes 0.02 0.00 - 0.07 K/uL    Comment: Performed at [facility], [address] [place], [place] [address]  Comprehensive metabolic panel     Status: Abnormal   Collection Time: 08/06/22  6:40 PM  Result Value Ref Range   Sodium 138 135 - 145 mmol/L   Potassium 3.8 3.5 - 5.1 mmol/L   Chloride 106 98 - 111 mmol/L   CO2 27 22 - 32 mmol/L   Glucose, Bld 159 (H) 70 - 99 mg/dL    Comment: Glucose reference range applies only to samples taken after fasting for at least 8 hours.   BUN 14 6 - 20 mg/dL   Creatinine, Ser 6.041.10 (H) 0.44 - 1.00 mg/dL   Calcium 9.2 8.9 - 54.010.3 mg/dL   Total Protein 7.1 6.5 - 8.1 g/dL   Albumin 3.6 3.5 - 5.0 g/dL   AST 15 15 - 41 U/L   ALT 21 0 - 44 U/L   Alkaline Phosphatase 63 38 - 126 U/L    Total Bilirubin 0.3 0.3 - 1.2 mg/dL   GFR, Estimated >98>60 >11>60 mL/min    Comment: (NOTE) Calculated using the CKD-EPI Creatinine Equation (2021)    Anion gap 5 5 - 15    Comment: Performed at [facility], [address] [place], [place] [address]  Lipid panel     Status: Abnormal   Collection Time: 08/06/22  6:40 PM  Result Value Ref Range   Cholesterol 136 0 - 200 mg/dL   Triglycerides 295266 (H) <150 mg/dL   HDL 23 (L) >62>40 mg/dL   Total CHOL/HDL Ratio 5.9 RATIO   VLDL 53 (H) 0 - 40 mg/dL   LDL Cholesterol 60 0 - 99 mg/dL    Comment:        Total Cholesterol/HDL:CHD Risk Coronary Heart Disease Risk Table                     Men   Women  1/2 Average Risk   3.4   3.3  Average Risk       5.0   4.4  2 X Average Risk   9.6   7.1  3 X Average Risk  23.4   11.0        Use the calculated Patient Ratio above and the CHD Risk Table to determine the patient's CHD Risk.        ATP III CLASSIFICATION (LDL):  <100     mg/dL   Optimal  [id number]  mg/dL   Near or Above                    Optimal  130-159  mg/dL   Borderline  [id number]  mg/dL   High  >295>190     mg/dL   Very High Performed at [facility], [address]  [place], [place] [address]   Hemoglobin A1c     Status: Abnormal   Collection Time: 08/06/22  6:40 PM  Result Value Ref Range   Hgb A1c MFr Bld 11.5 (H) 4.8 - 5.6 %    Comment: (NOTE) Pre diabetes:          5.7%-6.4%  Diabetes:              >6.4%  Glycemic control for   <7.0% adults with diabetes    Mean Plasma Glucose 283.35 mg/dL    Comment: Performed at [facility], [address], [place], [place] [address]  Glucose, capillary     Status: Abnormal   Collection Time: 08/06/22  7:36 PM  Result Value Ref Range   Glucose-Capillary 175 (H) 70 - 99 mg/dL    Comment: Glucose reference range applies only to samples taken after fasting for at least 8 hours.  Glucose, capillary     Status: Abnormal   Collection Time: 08/07/22   6:03 AM  Result Value Ref Range   Glucose-Capillary 190 (H) 70 - 99 mg/dL    Comment: Glucose reference range applies only to samples taken after fasting for at least 8 hours.  Glucose, capillary     Status: Abnormal   Collection Time: 08/07/22 12:05 PM  Result Value Ref Range   Glucose-Capillary 168 (H) 70 - 99 mg/dL    Comment: Glucose reference range applies only to samples taken after fasting for at least 8 hours.  Glucose, capillary     Status: Abnormal   Collection Time: 08/07/22  5:23 PM  Result Value Ref Range   Glucose-Capillary 263 (H) 70 - 99 mg/dL    Comment: Glucose reference range applies only to samples taken after fasting for at least 8 hours.  Glucose, capillary     Status: Abnormal   Collection Time: 08/07/22  7:18 PM  Result Value Ref Range   Glucose-Capillary 218 (H) 70 - 99 mg/dL    Comment: Glucose reference range applies only to samples taken after fasting for at least 8 hours.  Glucose, capillary     Status: Abnormal   Collection Time: 08/08/22  5:57 AM  Result Value Ref Range   Glucose-Capillary 182 (H) 70 - 99 mg/dL    Comment: Glucose reference range applies only to samples taken after fasting for at least 8 hours.  Glucose, capillary     Status: Abnormal   Collection Time: 08/08/22 10:57 AM  Result Value Ref Range   Glucose-Capillary 225 (H) 70 - 99 mg/dL    Comment: Glucose reference range applies only to samples taken after fasting for at least 8 hours.  Glucose, capillary     Status: Abnormal   Collection Time: 08/08/22 11:54 AM  Result Value Ref Range   Glucose-Capillary 178 (H) 70 - 99 mg/dL    Comment: Glucose reference range applies only to samples taken after fasting for at least 8 hours.    Blood Alcohol level:  Lab Results  Component Value Date   ETH <10 08/02/2022   ETH <10 02/11/2022    Metabolic Disorder Labs: Lab Results  Component Value Date   HGBA1C 11.5 (H) 08/06/2022   MPG 283.35 08/06/2022   MPG 312.05 02/11/2022   Lab  Results  Component Value Date   PROLACTIN 11.9 02/11/2022   Lab Results  Component Value Date   CHOL 136 08/06/2022   TRIG 266 (H) 08/06/2022   HDL 23 (L) 08/06/2022   CHOLHDL 5.9 08/06/2022  VLDL 53 (H) 08/06/2022   LDLCALC 60 08/06/2022   LDLCALC UNABLE TO CALCULATE IF TRIGLYCERIDE OVER 400 mg/dL 02/11/2022    Physical Findings: AIMS: Facial and Oral Movements Muscles of Facial Expression: None, normal Lips and Perioral Area: None, normal Jaw: None, normal Tongue: None, normal,Extremity Movements Upper (arms, wrists, hands, fingers): None, normal Lower (legs, knees, ankles, toes): None, normal, Trunk Movements Neck, shoulders, hips: None, normal, Overall Severity Severity of abnormal movements (highest score from questions above): None, normal Incapacitation due to abnormal movements: None, normal Patient's awareness of abnormal movements (rate only patient's report): No Awareness, Dental Status Current problems with teeth and/or dentures?: No Does patient usually wear dentures?: No  CIWA:    COWS:     Musculoskeletal: Strength & Muscle Tone: within normal limits Gait & Station: normal Patient leans: N/A  Psychiatric Specialty Exam:  Presentation  General Appearance:  Casual; Appropriate for Environment  Eye Contact: Good  Speech: Clear and Coherent  Speech Volume: Normal  Handedness: Ambidextrous  Mood and Affect  Mood: Anxious; Depressed  Affect: Congruent; Appropriate  Thought Process  Thought Processes: Coherent; Goal Directed  Descriptions of Associations:Intact  Orientation:Full (Time, Place and Person)  Thought Content:Logical  History of Schizophrenia/Schizoaffective disorder:Yes  Duration of Psychotic Symptoms:No data recorded Hallucinations:Hallucinations: None  Ideas of Reference:None  Suicidal Thoughts:Suicidal Thoughts: No SI Passive Intent and/or Plan: Without Intent; Without Plan  Homicidal Thoughts:Homicidal  Thoughts: No  Sensorium  Memory: Immediate Good; Recent Good  Judgment: Fair  Insight: Fair  Executive Functions  Concentration: Good  Attention Span: Good  Recall: Good  Fund of Knowledge: Good  Language: Good  Psychomotor Activity  Psychomotor Activity: Psychomotor Activity: Normal  Assets  Assets: Physical Health; Resilience  Sleep  Sleep: Sleep: Fair Number of Hours of Sleep: 6.25   Physical Exam: Physical Exam Vitals and nursing note reviewed.  Constitutional:      Appearance: She is obese. She is not ill-appearing.  HENT:     Head: Normocephalic.     Nose: Nose normal.     Mouth/Throat:     Mouth: Mucous membranes are moist.  Eyes:     Pupils: Pupils are equal, round, and reactive to light.  Cardiovascular:     Rate and Rhythm: Normal rate.     Pulses: Normal pulses.  Pulmonary:     Effort: Pulmonary effort is normal.  Abdominal:     Palpations: Abdomen is soft.  Musculoskeletal:        General: Normal range of motion.     Cervical back: Normal range of motion.  Skin:    General: Skin is dry.  Neurological:     Mental Status: She is alert and oriented to person, place, and time.  Psychiatric:        Attention and Perception: Attention normal. She does not perceive auditory or visual hallucinations.        Mood and Affect: Affect normal. Mood is not anxious or depressed.        Speech: Speech normal.        Behavior: Behavior normal. Behavior is cooperative.        Thought Content: Thought content is not paranoid or delusional. Thought content does not include homicidal or suicidal ideation. Thought content does not include homicidal or suicidal plan.        Cognition and Memory: Cognition and memory normal.        Judgment: Judgment normal.    Review of Systems  Constitutional: Negative.   HENT:  Negative.    Eyes: Negative.   Respiratory: Negative.    Cardiovascular: Negative.   Gastrointestinal: Negative.   Skin: Negative.    Neurological: Negative.   Psychiatric/Behavioral:  Negative for depression, hallucinations, substance abuse and suicidal ideas (Patient endorses passive suicidal ideations). The patient is not nervous/anxious and does not have insomnia.    Blood pressure 117/79, pulse 87, temperature 98.3 F (36.8 C), temperature source Oral, resp. rate 18, height 5\' 7"  (1.702 m), weight (!) 137.3 kg, SpO2 97 %. Body mass index is 47.41 kg/m.   Treatment Plan Summary:  Daily contact with patient to assess and evaluate symptoms and progress in treatment and Medication management  Safety and Monitoring: Involuntary admission to inpatient psychiatric unit for safety, stabilization and treatment Daily contact with patient to assess and evaluate symptoms and progress in treatment Patient's case to be discussed in multi-disciplinary team meeting Observation Level : q15 minute checks Vital signs: q12 hours Precautions: safety     Diagnoses:  Principal Problem:   Schizoaffective disorder (HCC) Active Problems:   Generalized anxiety disorder   Insomnia    Schizoaffective disorder (bipolar type) -Patient is currently on Haloperidol Decanoate 100 mg/mL injection, provider to determine when her last injection was received Last injection was received 9/26, next haldol dec injection due 10/24 -Continue:  - Prozac 40 mg daily for management of depression   Generalized anxiety disorder -Continue:  -hydroxyzine 25 mg every 6 hours PRN   Insomnia -Continue: - hydroxyzine 50 mg at bedtime as needed for insomnia -Stop trazodone 50 mg at bedtime due to patient experiencing nightmares from use   Type II Diabetes Mellitus:  Start:   -Metformin XR 500 mg BID: medication changed to extended release to minimize gastrointestinal issues due to ongoing c/o diarrhea  Continue: - home meds for diabetes, monitor FSBS and address  accordingly. -Stop:  - metformin 500 mg 2 times daily      Other PRNS -Continue  Tylenol 650 mg every 6 hours PRN for mild pain -Continue Maalox 30 mg every 4 hrs PRN for indigestion -Continue Milk of Magnesia as needed every 6 hrs for constipation   -Patient was counseled regarding need to abstain completely from marijuana and cocaine after dc.    -Patient to continue to attend unit groups as a way to acquire  new coping skills  -Patient will need to follow up with primary care provider upon discharge for overall health maintenance given current health status  -No roommate order placed due to patient being unable to sleep comfortably at night   Discharge Planning: Social work and case management to assist with discharge planning and identification of hospital follow-up needs prior to discharge Discharge Concerns: Need to establish a safety plan; Medication compliance and effectiveness Discharge Goals: Return home with outpatient referrals for mental health follow-up including medication management/psychotherapy   I certify that inpatient services furnished can reasonably be expected to improve the patient's condition.  11/24, NP 08/08/2022, 1:48 PMPatient ID: [id number] [place], female   DOB: [birth year], 26 y.o.   MRN: [id number]

## 2022-08-09 LAB — GLUCOSE, CAPILLARY
Glucose-Capillary: 155 mg/dL — ABNORMAL HIGH (ref 70–99)
Glucose-Capillary: 170 mg/dL — ABNORMAL HIGH (ref 70–99)
Glucose-Capillary: 205 mg/dL — ABNORMAL HIGH (ref 70–99)
Glucose-Capillary: 208 mg/dL — ABNORMAL HIGH (ref 70–99)

## 2022-08-09 NOTE — Progress Notes (Addendum)
Pt presents with pleasant mood, affect congruent. Cindy Leonard states she feels '' much better than when I came in. I am really doing good and I am ready to go'' patient reports she does not have any SI or HI no A/V Hallucinations, has been sleeping '' much better '' which she contributes to helping her mood the most.  Patient states her only concern is '' talking to the doctor about discharge.and making sure my PCP appointment has been made because I missed in when coming in here. And I take my diabetes seriously. ''  Patient has been attending unit programming and meals, compliant with am medications and able to make her needs known. Above discussed in treatment team. Pt made aware of follow up apt.  Pt completed self inventory and rates her depression at 0/10 , hopelessness at 0/10 on scale, with 10 being worst, 0 being none. She rates her anxiety at 3/10 on scale with 10 being worst 0 being none. Pt reports her goal is '' to try to leave '' and that she will '' try to go to group today '' Pt is safe, will con't to montior.

## 2022-08-09 NOTE — [facility] Group Notes (Signed)
Psychoeducational Group Patients were given interactive activity in which they were asked to identify 3 signs they were not compensating well in regards to their mental health. Patients were then asked to recognize three things that fuel them, and help them cope with their mental illness. Pt attended and then left early.

## 2022-08-09 NOTE — [facility] Group Notes (Signed)
Adult Psychoeducational Group  Date:  08/10/2022 Time:  1300-1400  Group Topic/Focus: Continuation of the group from Saturday. Looking at the lists that were created and talking about what needs to be done with the homework of 30 positives about themselves.                                     Talking about taking their power back and helping themselves to develop a positive self esteem.      Participation Quality:  did not attend  Gizzelle Lacomb A  

## 2022-08-09 NOTE — Plan of Care (Signed)

## 2022-08-09 NOTE — Progress Notes (Signed)
[facility] MD Progress Note  08/09/2022 9:07 AM Cindy Leonard  MRN:  [id number] Subjective:   HPI:  Cindy Leonard is a 26 year old, African-American female with a past psychiatric history significant for schizoaffective disorder (bipolar type), insomnia, and generalized anxiety disorder who was recently involuntarily admitted to [facility] after presenting to [facility] ED due to worsening depressive symptoms and suicidal ideation.  Assessment:  Patient presents in bed this morning. She is calm and cooperative. Casual presentation. Appropriate mood, affect congruent. Speech appropriate. Thought content linear and logical. Reporting she slept "all night"; denies any nightmares. "I'm actually happy". She denies any depression, suicidal/homicidal ideations, auditory/visual hallucinations. She denies any insomnia, anhedonia, feelings of guilt/hopelessness, low energy or concentration, appetite or psychomotor changes. She reports "getting along" on the unit with staff and peers; chart reflects consistent group attendance. States communication is "going well" with her mother. Reports resolution of diarrhea overnight and thankful for medication change. She inquired about PCP appointment and informed of new appointment being 11/02; she expressed gratitude for the appointment and stated plan to follow-up. She contracts for safety and says she's "just waiting to discharge at this point". No further medication adjustments noted at this time.   Principal Problem: Schizoaffective disorder ([place]) Diagnosis: Principal Problem:   Schizoaffective disorder ([place]) Active Problems:   Generalized anxiety disorder   Insomnia  Total Time spent with patient: 15 minutes  Past Psychiatric History:  Schizoaffective disorder, bipolar type Anxiety Insomnia  Past Medical History:  Past Medical History:  Diagnosis Date   Bipolar 1 disorder ([place])    Depression    Diabetes mellitus without complication ([place])     Schizophrenia ([place])     Past Surgical History:  Procedure Laterality Date   INCISION AND DRAINAGE ABSCESS Left 12/08/2020   Procedure: INCISION AND DRAINAGE POSTERIOR NECK ABSCESS;  Surgeon: Greer Pickerel, MD;  Location: [place];  Service: General;  Laterality: Left;   NO PAST SURGERIES     Family History:  Family History  Problem Relation Age of Onset   Stroke Mother    Diabetes Other    Hypertension Other    Asthma Father    Asthma Brother    Family Psychiatric  History:  Mother (maternal) - patient is  unsure of psychiatric issues Aunt (maternal) - past history of suicide attempt  Social History:  Social History   Substance and Sexual Activity  Alcohol Use No     Social History   Substance and Sexual Activity  Drug Use Yes   Types: Marijuana   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   Sexual activity: Yes    Birth control/protection: None  Other Topics Concern   Not on file  Social History Narrative   Not on file   Social Determinants  of Health   Financial Resource Strain: Not on file  Food Insecurity: No Food Insecurity (08/04/2022)   Hunger Vital Sign    Worried About Running Out of Food in the Last Year: Never true    Ran Out of Food in the Last Year: Never true  Transportation Needs: Unknown (08/04/2022)   PRAPARE - Hydrologist (Medical): Patient refused    Lack of Transportation  (Non-Medical): Patient refused  Physical Activity: Not on file  Stress: Not on file  Social Connections: Not on file   Additional Social History:  -currently lives with mother -[place] care coordination services   Sleep: Fair  Appetite:  Fair  Current Medications: Current Facility-Administered Medications  Medication Dose Route Frequency Provider Last Rate Last Admin   acetaminophen (TYLENOL) tablet 650 mg  650 mg Oral Q6H PRN Vesta Mixer, NP       alum & mag hydroxide-simeth (MAALOX/MYLANTA) 200-200-20 MG/5ML suspension 30 mL  30 mL Oral Q4H PRN Vesta Mixer, NP       FLUoxetine (PROZAC) capsule 40 mg  40 mg Oral Daily Nwoko, Uchenna E, PA   40 mg at 08/09/22 0805   hydrOXYzine (ATARAX) tablet 10 mg  10 mg Oral TID PRN Vesta Mixer, NP   10 mg at 08/08/22 2106   insulin aspart (novoLOG) injection 0-15 Units  0-15 Units Subcutaneous TID WC Vesta Mixer, NP   3 Units at 08/09/22 0601   insulin aspart (novoLOG) injection 0-5 Units  0-5 Units Subcutaneous QHS Vesta Mixer, NP   2 Units at 08/07/22 2106   insulin glargine-yfgn (SEMGLEE) injection 60 Units  60 Units Subcutaneous QHS Massengill, Ovid Curd, MD   60 Units at 08/08/22 2030   metFORMIN (GLUCOPHAGE-XR) 24 hr tablet 500 mg  500 mg Oral BID WC Leevy-Johnson, Setsuko Robins A, NP   500 mg at 08/09/22 0805   traZODone (DESYREL) tablet 50 mg  50 mg Oral QHS PRN Vesta Mixer, NP   50 mg at 08/08/22 2107    Lab Results:  Results for orders placed or performed during the hospital encounter of 08/04/22 (from the past 48 hour(s))  Glucose, capillary     Status: Abnormal   Collection Time: 08/07/22 12:05 PM  Result Value Ref Range   Glucose-Capillary 168 (H) 70 - 99 mg/dL    Comment: Glucose reference range applies only to samples taken after fasting for at least 8 hours.  Glucose, capillary     Status: Abnormal   Collection Time: 08/07/22  5:23 PM  Result Value Ref Range   Glucose-Capillary 263 (H) 70 - 99 mg/dL     Comment: Glucose reference range applies only to samples taken after fasting for at least 8 hours.  Glucose, capillary     Status: Abnormal   Collection Time: 08/07/22  7:18 PM  Result Value Ref Range   Glucose-Capillary 218 (H) 70 - 99 mg/dL    Comment: Glucose reference range applies only to samples taken after fasting for at least 8 hours.  Glucose, capillary     Status: Abnormal   Collection Time: 08/08/22  5:57 AM  Result Value Ref Range   Glucose-Capillary 182 (H) 70 - 99 mg/dL    Comment: Glucose reference range applies only to samples taken after fasting for at least 8 hours.  Glucose, capillary     Status: Abnormal   Collection Time: 08/08/22 10:57 AM  Result Value Ref Range   Glucose-Capillary 225 (H) 70 - 99 mg/dL  Comment: Glucose reference range applies only to samples taken after fasting for at least 8 hours.  Glucose, capillary     Status: Abnormal   Collection Time: 08/08/22 11:54 AM  Result Value Ref Range   Glucose-Capillary 178 (H) 70 - 99 mg/dL    Comment: Glucose reference range applies only to samples taken after fasting for at least 8 hours.  Glucose, capillary     Status: Abnormal   Collection Time: 08/08/22  5:00 PM  Result Value Ref Range   Glucose-Capillary 149 (H) 70 - 99 mg/dL    Comment: Glucose reference range applies only to samples taken after fasting for at least 8 hours.  Glucose, capillary     Status: Abnormal   Collection Time: 08/08/22  7:28 PM  Result Value Ref Range   Glucose-Capillary 159 (H) 70 - 99 mg/dL    Comment: Glucose reference range applies only to samples taken after fasting for at least 8 hours.  Glucose, capillary     Status: Abnormal   Collection Time: 08/09/22  5:57 AM  Result Value Ref Range   Glucose-Capillary 155 (H) 70 - 99 mg/dL    Comment: Glucose reference range applies only to samples taken after fasting for at least 8 hours.    Blood Alcohol level:  Lab Results  Component Value Date   ETH <10 08/02/2022   ETH  <10 29/93/7169    Metabolic Disorder Labs: Lab Results  Component Value Date   HGBA1C 11.5 (H) 08/06/2022   MPG 283.35 08/06/2022   MPG 312.05 02/11/2022   Lab Results  Component Value Date   PROLACTIN 11.9 02/11/2022   Lab Results  Component Value Date   CHOL 136 08/06/2022   TRIG 266 (H) 08/06/2022   HDL 23 (L) 08/06/2022   CHOLHDL 5.9 08/06/2022   VLDL 53 (H) 08/06/2022   LDLCALC 60 08/06/2022   LDLCALC UNABLE TO CALCULATE IF TRIGLYCERIDE OVER 400 mg/dL 02/11/2022    Physical Findings: AIMS: Facial and Oral Movements Muscles of Facial Expression: None, normal Lips and Perioral Area: None, normal Jaw: None, normal Tongue: None, normal,Extremity Movements Upper (arms, wrists, hands, fingers): None, normal Lower (legs, knees, ankles, toes): None, normal, Trunk Movements Neck, shoulders, hips: None, normal, Overall Severity Severity of abnormal movements (highest score from questions above): None, normal Incapacitation due to abnormal movements: None, normal Patient's awareness of abnormal movements (rate only patient's report): No Awareness, Dental Status Current problems with teeth and/or dentures?: No Does patient usually wear dentures?: No  CIWA:    COWS:     Musculoskeletal: Strength & Muscle Tone: within normal limits Gait & Station: normal Patient leans: N/A  Psychiatric Specialty Exam:  Presentation  General Appearance:  Casual; Appropriate for Environment  Eye Contact: Good  Speech: Clear and Coherent  Speech Volume: Normal  Handedness: Ambidextrous  Mood and Affect  Mood: Anxious; Depressed  Affect: Congruent; Appropriate  Thought Process  Thought Processes: Coherent; Goal Directed  Descriptions of Associations:Intact  Orientation:Full (Time, Place and Person)  Thought Content:Logical  History of Schizophrenia/Schizoaffective disorder:Yes  Duration of Psychotic Symptoms:No data recorded Hallucinations:Hallucinations:  None  Ideas of Reference:None  Suicidal Thoughts:Suicidal Thoughts: No  Homicidal Thoughts:Homicidal Thoughts: No  Sensorium  Memory: Immediate Good; Recent Good  Judgment: Fair  Insight: Fair  Community education officer  Concentration: Good  Attention Span: Good  Recall: Good  Fund of Knowledge: Good  Language: Good  Psychomotor Activity  Psychomotor Activity: Psychomotor Activity: Normal  Assets  Assets: Physical Health; Resilience  Sleep  Sleep: Sleep: Fair Number of Hours of Sleep: 6.25   Physical Exam: Physical Exam Vitals and nursing note reviewed.  Constitutional:      Appearance: She is obese. She is not ill-appearing.  HENT:     Head: Normocephalic.     Nose: Nose normal.     Mouth/Throat:     Mouth: Mucous membranes are moist.  Eyes:     Pupils: Pupils are equal, round, and reactive to light.  Cardiovascular:     Rate and Rhythm: Normal rate.     Pulses: Normal pulses.  Pulmonary:     Effort: Pulmonary effort is normal.  Abdominal:     Palpations: Abdomen is soft.  Musculoskeletal:        General: Normal range of motion.     Cervical back: Normal range of motion.  Skin:    General: Skin is dry.  Neurological:     Mental Status: She is alert and oriented to person, place, and time.  Psychiatric:        Attention and Perception: Attention normal. She does not perceive auditory or visual hallucinations.        Mood and Affect: Affect normal. Mood is not anxious or depressed.        Speech: Speech normal.        Behavior: Behavior normal. Behavior is cooperative.        Thought Content: Thought content is not paranoid or delusional. Thought content does not include homicidal or suicidal ideation. Thought content does not include homicidal or suicidal plan.        Cognition and Memory: Cognition and memory normal.        Judgment: Judgment normal.    Review of Systems  Constitutional: Negative.   HENT: Negative.    Eyes: Negative.    Respiratory: Negative.    Cardiovascular: Negative.   Gastrointestinal: Negative.   Skin: Negative.   Neurological: Negative.   Psychiatric/Behavioral:  Negative for depression, hallucinations, substance abuse and suicidal ideas (Patient endorses passive suicidal ideations). The patient is not nervous/anxious and does not have insomnia.   All other systems reviewed and are negative.  Blood pressure 126/79, pulse 68, temperature 98.6 F (37 C), temperature source Oral, resp. rate 20, height 5\' 7"  (1.702 m), weight (!) 137.3 kg, SpO2 98 %. Body mass index is 47.41 kg/m.   Treatment Plan Summary:  Daily contact with patient to assess and evaluate symptoms and progress in treatment and Medication management  Safety and Monitoring: Involuntary admission to inpatient psychiatric unit for safety, stabilization and treatment Daily contact with patient to assess and evaluate symptoms and progress in treatment Patient's case to be discussed in multi-disciplinary team meeting Observation Level : q15 minute checks Vital signs: q12 hours Precautions: safety     Diagnoses:  Principal Problem:   Schizoaffective disorder ([place]) Active Problems:   Generalized anxiety disorder   Insomnia    Schizoaffective disorder (bipolar type) -Patient is currently on Haloperidol Decanoate 100 mg/mL injection, provider to determine when her last injection was received Last injection was received 9/26, next haldol dec injection due 10/24 -Continue:  - Prozac 40 mg daily for management of depression   Generalized anxiety disorder -Continue:  -hydroxyzine 25 mg every 6 hours PRN   Insomnia -Continue: - hydroxyzine 50 mg at bedtime as needed for insomnia -Stop trazodone 50 mg at bedtime due to patient experiencing nightmares from use   Type II Diabetes Mellitus:  Continue:   -Metformin XR 500 mg BID: medication  changed to extended release to minimize gastrointestinal issues due to ongoing c/o  diarrhea  Continue: - home meds for diabetes, monitor FSBS and address  accordingly.      Other PRNS -Continue:  - Tylenol 650 mg every 6 hours PRN for mild pain - Maalox 30 mg every 4 hrs PRN for indigestion - Milk of Magnesia as needed every 6 hrs for constipation   -Patient was counseled regarding need to abstain completely from marijuana and cocaine after dc.    -Patient to continue to attend unit groups as a way to acquire  new coping skills  -Patient will need to follow up with primary care provider upon discharge for overall health maintenance given current health status  -No roommate order placed due to patient being unable to sleep comfortably at night   Discharge Planning: Social work and case management to assist with discharge planning and identification of hospital follow-up needs prior to discharge Discharge Concerns: Need to establish a safety plan; Medication compliance and effectiveness Discharge Goals: Return home with outpatient referrals for mental health follow-up including medication management/psychotherapy   I certify that inpatient services furnished can reasonably be expected to improve the patient's condition.  Inda Merlin, NP 08/09/2022, 9:07 AMPatient ID: Sherre Scarlet Dupuis, female   DOB: Mar 11, 1996, 26 y.o.   MRN: [id number] Patient ID: Lasharra Lundy Kovich, female   DOB: Oct 11, 1996, 26 y.o.   MRN: [id number]

## 2022-08-09 NOTE — Progress Notes (Signed)
   08/09/22 2247  Psych Admission Type (Psych Patients Only)  Admission Status Involuntary  Psychosocial Assessment  Patient Complaints Insomnia  Eye Contact Fair  Facial Expression Animated  Affect Appropriate to circumstance  Speech Logical/coherent  Interaction Assertive  Motor Activity Other (Comment) (WDL)  Appearance/Hygiene Unremarkable  Behavior Characteristics Appropriate to situation  Mood Pleasant  Thought Process  Coherency WDL  Content WDL  Delusions None reported or observed  Perception WDL  Hallucination None reported or observed  Judgment WDL  Confusion None  Danger to Self  Current suicidal ideation? Denies  Self-Injurious Behavior No self-injurious ideation or behavior indicators observed or expressed   Agreement Not to Harm Self Yes  Description of Agreement verbal  Danger to Others  Danger to Others None reported or observed

## 2022-08-09 NOTE — Group Note (Signed)
LCSW Group Therapy Note   Group Date: 03/18/2022 Start Time: 1000 End Time: 1100   Type of Therapy and Topic:  Group Therapy: Wise Mind  Participation Level:  Did not attend  Description of Group: Group members were presented with topic about wise mind, reasonable mind, and emotional mind.  Group members asked to identify situations were they used their wise mind, reasonable mind and emotional mind.  Members asked to identify how behaviors affected them after using parts of their mind and identified alternative behaviors they can use when they are in crisis.    Therapeutic Goals:  1. Patients will identify consequences of using emotional mind and rational mind.  2. Patients will engage in discussion on how they cope when they are in crisis 3.  Patients will discuss coping mechanisms to engage their wise mind     Summary of Patient Progress:    Did not attend  Therapeutic Modalities:  DBT Solution focused therapy    Tuyen Uncapher E Leniyah Martell, LCSW

## 2022-08-09 NOTE — Progress Notes (Signed)
Adult Psychoeducational Group Note  Date:  08/09/2022 Time:  9:16 PM  Group Topic/Focus:  Wrap-Up Group:   The focus of this group is to help patients review their daily goal of treatment and discuss progress on daily workbooks.  Participation Level:  Active  Participation Quality:  Appropriate  Affect:  Appropriate  Cognitive:  Appropriate  Insight: Appropriate  Engagement in Group:  Engaged  Modes of Intervention:  Education and Exploration  Additional Comments:  Patient attended and participated in group tonight. She reports that she like her smile.  Cindy Leonard [facility] 08/09/2022, 9:16 PM

## 2022-08-09 NOTE — B[facility] Group Notes (Signed)
Adult Psychoeducational Group Note Date:  08/09/2022 Time:  1100-1130 Group Topic/Focus: PROGRESSIVE RELAXATION. Leonard group where deep breathing is taught and tensing and relaxation muscle groups is used. Imagery is used as well.  Pts are asked to imagine 3 pillars that hold them up when they are not able to hold themselves up and to share that with the group.   Participation Level: did not attend  Participation Level: Cindy Leonard   

## 2022-08-10 ENCOUNTER — Encounter (HOSPITAL_COMMUNITY): Payer: Self-pay

## 2022-08-10 LAB — GLUCOSE, CAPILLARY
Glucose-Capillary: 187 mg/dL — ABNORMAL HIGH (ref 70–99)
Glucose-Capillary: 115 mg/dL — ABNORMAL HIGH (ref 70–99)
Glucose-Capillary: 166 mg/dL — ABNORMAL HIGH (ref 70–99)
Glucose-Capillary: 191 mg/dL — ABNORMAL HIGH (ref 70–99)

## 2022-08-10 NOTE — [facility] Group Notes (Signed)
  Spiritual care group on grief and loss facilitated by chaplain Janne Napoleon, [facility]   Group Goal:   Support / Education around grief and loss   Members engage in facilitated group support and psycho-social education.   Group Description:   Following introductions and group rules, group members engaged in facilitated group dialog and support around topic of loss, with particular support around experiences of loss in their lives. Group Identified types of loss (relationships / self / things) and identified patterns, circumstances, and changes that precipitate losses. Reflected on thoughts / feelings around loss, normalized grief responses, and recognized variety in grief experience. Group noted Worden's four tasks of grief in discussion.   Group drew on Adlerian / Rogerian, narrative, MI,   Patient Progress: Cindy Leonard attended group and actively participated and engaged in the group conversation.  She commented that it might be difficult for her to stay in group if it got too intense.  She did leave for a time, but returned to the group for the end of the session.  [address] [place] Pager, [phone]

## 2022-08-10 NOTE — Progress Notes (Signed)
[facility] MD Progress Note  08/10/2022 9:37 AM Cindy Leonard  MRN:  [id number]   Reason for Admission:  Cindy Leonard is a 26 year old, African-American female with a past psychiatric history significant for schizoaffective disorder (bipolar type), insomnia, and generalized anxiety disorder who was recently involuntarily admitted to [facility] after presenting to [facility] ED due to worsening depressive symptoms and suicidal ideation.The patient is currently on [facility].   Chart Review from last 24 hours:  The patient's chart was reviewed and nursing notes were reviewed. The patient's case was discussed in multidisciplinary team meeting. Per [facility] patient is compliant with psychotropic medications including Prozac 40 mg daily, using Atarax as needed for anxiety last use last night also using trazodone as needed for sleep last use last night with good efficacy reported and no side effects.  Staff report patient to be cooperative on the unit participating in groups continuing to deny to staff any active SI intention or plan.  Information Obtained Today During Patient Interview: The patient was seen and evaluated on the unit. On assessment today the patient reports improved depression and anxiety since admission scaling her depression 1 out of 1010 being the worst scaling her anxiety 4 out of 1010 being the worst reports medication has been helpful with no side effects to be reported reports improved sleep and appetite, denies passive SI or feeling hopeless or helpless for at least 48 hours, denies active suicidal ideation intention or plan since Friday, denies HI or AVH.  Reports she is communicating with her outpatient community support team with [facility] trying to formulate a discharge plan, I discussed with her plan to discharge home tomorrow to homeless shelter or to her CMT if they are able to formulate a discharge plan by then, she agrees.  She agrees to comply with medication  and follow-up appointments after discharge. She reports no further stomach upset or diarrhea since the switch to metformin to extended release, discussed with patient importance to follow-up with primary care provider after discharge regarding her diabetes management, she agrees.  Sleep  Sleep:Sleep: Good Number of Hours of Sleep: 7   Principal Problem: Schizoaffective disorder (HCC) Diagnosis: Principal Problem:   Schizoaffective disorder (HCC) Active Problems:   Generalized anxiety disorder   Insomnia    Past Psychiatric History: See H&P  Past Medical History:  Past Medical History:  Diagnosis Date   Bipolar 1 disorder (HCC)    Depression    Diabetes mellitus without complication (HCC)    Schizophrenia (HCC)     Past Surgical History:  Procedure Laterality Date   INCISION AND DRAINAGE ABSCESS Left 12/08/2020   Procedure: INCISION AND DRAINAGE POSTERIOR NECK ABSCESS;  Surgeon: Gaynelle Adu, MD;  Location: [facility] OR;  Service: General;  Laterality: Left;   NO PAST SURGERIES     Family History:  Family History  Problem Relation Age of Onset   Stroke Mother    Diabetes Other    Hypertension Other    Asthma Father    Asthma Brother    Family Psychiatric  History: See H&P Social History: See H&P  Current Medications: Current Facility-Administered Medications  Medication Dose Route Frequency Provider Last Rate Last Admin   acetaminophen (TYLENOL) tablet 650 mg  650 mg Oral Q6H PRN Eligha Bridegroom, NP   650 mg at 08/09/22 1153   alum & mag hydroxide-simeth (MAALOX/MYLANTA) 200-200-20 MG/5ML suspension 30 mL  30 mL Oral Q4H PRN Eligha Bridegroom, NP  FLUoxetine (PROZAC) capsule 40 mg  40 mg Oral Daily Nwoko, Uchenna E, PA   40 mg at 08/10/22 0750   hydrOXYzine (ATARAX) tablet 10 mg  10 mg Oral TID PRN Eligha Bridegroom, NP   10 mg at 08/09/22 2108   insulin aspart (novoLOG) injection 0-15 Units  0-15 Units Subcutaneous TID WC Eligha Bridegroom, NP   3 Units at 08/10/22 0600    insulin aspart (novoLOG) injection 0-5 Units  0-5 Units Subcutaneous QHS Eligha Bridegroom, NP   2 Units at 08/09/22 2109   insulin glargine-yfgn (SEMGLEE) injection 60 Units  60 Units Subcutaneous QHS MassengillHarrold Donath, MD   60 Units at 08/09/22 2109   metFORMIN (GLUCOPHAGE-XR) 24 hr tablet 500 mg  500 mg Oral BID WC Leevy-Johnson, Brooke A, NP   500 mg at 08/10/22 0751   traZODone (DESYREL) tablet 50 mg  50 mg Oral QHS PRN Eligha Bridegroom, NP   50 mg at 08/09/22 2108    Lab Results:  Results for orders placed or performed during the hospital encounter of 08/04/22 (from the past 48 hour(s))  Glucose, capillary     Status: Abnormal   Collection Time: 08/08/22 10:57 AM  Result Value Ref Range   Glucose-Capillary 225 (H) 70 - 99 mg/dL    Comment: Glucose reference range applies only to samples taken after fasting for at least 8 hours.  Glucose, capillary     Status: Abnormal   Collection Time: 08/08/22 11:54 AM  Result Value Ref Range   Glucose-Capillary 178 (H) 70 - 99 mg/dL    Comment: Glucose reference range applies only to samples taken after fasting for at least 8 hours.  Glucose, capillary     Status: Abnormal   Collection Time: 08/08/22  5:00 PM  Result Value Ref Range   Glucose-Capillary 149 (H) 70 - 99 mg/dL    Comment: Glucose reference range applies only to samples taken after fasting for at least 8 hours.  Glucose, capillary     Status: Abnormal   Collection Time: 08/08/22  7:28 PM  Result Value Ref Range   Glucose-Capillary 159 (H) 70 - 99 mg/dL    Comment: Glucose reference range applies only to samples taken after fasting for at least 8 hours.  Glucose, capillary     Status: Abnormal   Collection Time: 08/09/22  5:57 AM  Result Value Ref Range   Glucose-Capillary 155 (H) 70 - 99 mg/dL    Comment: Glucose reference range applies only to samples taken after fasting for at least 8 hours.  Glucose, capillary     Status: Abnormal   Collection Time: 08/09/22 11:23 AM   Result Value Ref Range   Glucose-Capillary 170 (H) 70 - 99 mg/dL    Comment: Glucose reference range applies only to samples taken after fasting for at least 8 hours.  Glucose, capillary     Status: Abnormal   Collection Time: 08/09/22  4:34 PM  Result Value Ref Range   Glucose-Capillary 205 (H) 70 - 99 mg/dL    Comment: Glucose reference range applies only to samples taken after fasting for at least 8 hours.  Glucose, capillary     Status: Abnormal   Collection Time: 08/09/22  7:37 PM  Result Value Ref Range   Glucose-Capillary 208 (H) 70 - 99 mg/dL    Comment: Glucose reference range applies only to samples taken after fasting for at least 8 hours.  Glucose, capillary     Status: Abnormal   Collection Time: 08/10/22  5:54 AM  Result Value Ref Range   Glucose-Capillary 187 (H) 70 - 99 mg/dL    Comment: Glucose reference range applies only to samples taken after fasting for at least 8 hours.    Blood Alcohol level:  Lab Results  Component Value Date   ETH <10 08/02/2022   ETH <10 02/11/2022    Metabolic Disorder Labs: Lab Results  Component Value Date   HGBA1C 11.5 (H) 08/06/2022   MPG 283.35 08/06/2022   MPG 312.05 02/11/2022   Lab Results  Component Value Date   PROLACTIN 11.9 02/11/2022   Lab Results  Component Value Date   CHOL 136 08/06/2022   TRIG 266 (H) 08/06/2022   HDL 23 (L) 08/06/2022   CHOLHDL 5.9 08/06/2022   VLDL 53 (H) 08/06/2022   LDLCALC 60 08/06/2022   LDLCALC UNABLE TO CALCULATE IF TRIGLYCERIDE OVER 400 mg/dL 70/35/0093    Physical Findings: AIMS: Facial and Oral Movements Muscles of Facial Expression: None, normal Lips and Perioral Area: None, normal Jaw: None, normal Tongue: None, normal,Extremity Movements Upper (arms, wrists, hands, fingers): None, normal Lower (legs, knees, ankles, toes): None, normal, Trunk Movements Neck, shoulders, hips: None, normal, Overall Severity Severity of abnormal movements (highest score from questions  above): None, normal Incapacitation due to abnormal movements: None, normal Patient's awareness of abnormal movements (rate only patient's report): No Awareness, Dental Status Current problems with teeth and/or dentures?: No Does patient usually wear dentures?: No  CIWA:    COWS:     Musculoskeletal: Strength & Muscle Tone: within normal limits Gait & Station: normal Patient leans: N/A  Psychiatric Specialty Exam:  General Appearance: Fairly dressed and groomed, appears at stated age  Behavior: Calm, pleasant and cooperative  Psychomotor Activity:No psychomotor agitation or retardation noted  Eye Contact: Fair Speech: Normal   Mood: Euthymic, slight anxiety noted Mary related to discharge planning Affect: Congruent  Thought Process: Linear and goal directed Descriptions of Associations: Intact Thought Content: Hallucinations: Denies AH, VH  Delusions: No paranoia or other delusions noted Suicidal Thoughts: Denies SI, intention, plan  Homicidal Thoughts: Denies HI, intention, plan   Alertness/Orientation: Alert and fully oriented  Insight: Fair, improved Judgment: Fair, improved  Memory: Intact  Executive Functions  Concentration: Intact Attention Span: Intact Recall: Intact Fund of Knowledge: Fair   Assets  Assets: Physical Health; Resilience    Physical Exam: Physical Exam Vitals and nursing note reviewed.    Review of Systems  All other systems reviewed and are negative.  Blood pressure (!) 125/93, pulse 95, temperature 98.6 F (37 C), temperature source Oral, resp. rate 17, height 5\' 7"  (1.702 m), weight (!) 137.3 kg, SpO2 97 %. Body mass index is 47.41 kg/m.   Treatment Plan Summary:   ASSESSMENT:  Diagnoses / Active Problems: Principal Problem: Schizoaffective disorder (HCC) Diagnosis: Principal Problem:   Schizoaffective disorder (HCC) Active Problems:   Generalized anxiety disorder   Insomnia   PLAN: Safety and  Monitoring:  -- Voluntary admission to inpatient psychiatric unit for safety, stabilization and treatment  -- Daily contact with patient to assess and evaluate symptoms and progress in treatment  -- Patient's case to be discussed in multi-disciplinary team meeting  -- Observation Level : q15 minute checks  -- Vital signs:  q12 hours  -- Precautions: suicide, elopement, and assault  2. Medications:   Continue Prozac 40 mg daily for depression and long-term treatment of anxiety  Patient is on Haldol decanoate injection monthly for psychosis and mood stabilization, next injection due  10/24  Continue Atarax as needed for anxiety  Continue trazodone as needed for sleep  Continue metformin extended release for diabetes  The risks/benefits/side-effects/alternatives to this medication were discussed in detail with the patient and time was given for questions. The patient consents to medication trial.    -- Metabolic profile and EKG monitoring obtained while on an atypical antipsychotic (BMI: Lipid Panel: HbgA1c: QTc:)      3. Pertinent labs: Reviewed      Lab ordered: None  5. Group and Therapy: -- Encouraged patient to participate in unit milieu and in scheduled group therapies     -- Short Term Goals: Ability to identify changes in lifestyle to reduce recurrence of condition will improve, Ability to verbalize feelings will improve, Ability to disclose and discuss suicidal ideas, Ability to demonstrate self-control will improve, and Ability to identify and develop effective coping behaviors will improve  -- Long Term Goals: Improvement in symptoms so as ready for discharge  6. Discharge Planning:   -- Social work and case management to assist with discharge planning and identification of hospital follow-up needs prior to discharge  -- Estimated LOS: 5-7 days  -- Discharge Concerns: Need to establish a safety plan; Medication compliance and effectiveness  -- Discharge Goals: Return home  with outpatient referrals for mental health follow-up including medication management/psychotherapy Discussed with patient need to comply with outpatient primary care provider for outpatient management of diabetes, discussed with patient elevated hemoglobin A1c at time of admission 11.5 and importance to comply, patient reported she was noncompliant with metformin prior to admission secondary to side effect of stomach upset and diarrhea which was switched to extended release metformin during this hospital stay without any side effects reported, patient agrees.    Total Time Spent in Direct Patient Care:  I personally spent 25 minutes on the unit in direct patient care. The direct patient care time included face-to-face time with the patient, reviewing the patient's chart, communicating with other professionals, and coordinating care. Greater than 50% of this time was spent in counseling or coordinating care with the patient regarding goals of hospitalization, psycho-education, and discharge planning needs.   Cindy Sow Winfred Leeds, MD 08/10/2022, 9:37 AM

## 2022-08-10 NOTE — Progress Notes (Signed)
Recreation Therapy Notes  Date: 10.9.23 Time: 0930 Location: 400 Hall Dayroom  Group Topic: Stress Management   Goal Area(s) Addresses:  Patient will actively participate in stress management techniques presented during session.  Patient will successfully identify benefit of practicing stress management post d/c.   Behavioral Response: Appropriate  Intervention: Relaxation exercise with ambient sound and script   Activity: Guided Imagery. LRT provided education, instruction, and demonstration on practice of visualization via guided imagery. Patient was asked to participate in the technique introduced during session. LRT debriefed including topics of mindfulness, stress management and specific scenarios each patient could use these techniques. Patients were given suggestions of ways to access scripts post d/c and encouraged to explore Youtube and other apps available on smartphones, tablets, and computers.  Education:  Stress Management, Discharge Planning.   Education Outcome: Acknowledges education  Clinical Observations/Feedback: Patient actively engaged in technique introduced. Group session was disrupted when practitioner interrupted group to get a patient. Thus making it impossible for patients to regain focus during group session.    Ambrielle Kington-McCall, LRT,CTRS        Jaydence Vanyo A Rayshaun Needle-McCall 08/10/2022 12:37 PM

## 2022-08-10 NOTE — Progress Notes (Signed)
Adult Psychoeducational Group Note  Date:  08/10/2022 Time:  9:00 PM  Group Topic/Focus:  AA Group  Participation Level:  Minimal  Participation Quality:  Appropriate  Affect:  Appropriate  Cognitive:  Appropriate  Insight: Appropriate  Engagement in Group:  Limited  Modes of Intervention:  Discussion and Support  Additional Comments:   Pt attended and participated in the AA Group.  Cindy Leonard 08/10/2022, 9:00 PM

## 2022-08-10 NOTE — Progress Notes (Signed)
D:  Patient's self inventory sheet, patient sleeps good, sleep medication helpful.  Fair appetite, normal energy level, good concentration.  Rated depression, hopeless 1, anxiety 4.  Denied withdrawals.  Denied SI.  Denied physical problems.  Denied physical pain.  Goal is get help.  Plans to attend groups. A:  Medications administered per MD orders.  Emotional support and encouragement given patient. R:  Denied SI and HI, contracts for safety.  Denied A/V hallucinations.  Safety maintained with 15 minute checks.

## 2022-08-10 NOTE — BHH Counselor (Addendum)
9:30am: CSW spoke with the Pt about her discharge plans.  The Pt stated that she would like to go to [facility].  CSW informed the Pt that [facility] will not accept her due to her Medicaid being through [facility].  CSW explained that Medicaid has to be through [facility] only to attend [facility].  CSW also explained that [facility] remains closed and [facility] has a waiting list.  CSW encouraged the Pt to look through her list of shelters and to call some of them.  The Pt states that she also has a Care Coordinator that will be helping her after discharge.  CSW encouraged the Pt to continue to work with her Care Coordinator for housing assistance.   10:45am: CSW spoke with Crystal G. At [facility] in [place] who states that she has secured a bed for the Pt at [place] inquired about how this was able to occur since the Pt's insurance [place] in a different county.  Crystal G. states that the Pt's Medicaid has been changed to [facility] and that the Pt has been approved but needs her medical information faxed over to the center.  CSW will contact [facility] to confirm this information and if confirmed will send the Pt's information for admission.   11:00am: CSW spoke with [place] at [facility] who states that the Pt has not been accepted yet and that they will need her H&P and medication list in order to see if she qualifies for admission.  Benjamine Mola states that the Pt's Medicaid is now in [place].  CSW informed Benjamine Mola that the address [facility] has for the Pt is where she was living with her friend in [place] and that her ID states [place] as well.  Benjamine Mola stated to [place] "As long as she says she is homeless in [place] and her Medicaid is in [place] then we can take her here".  CSW will fax the H&P and Medication List to [place].   11:45am: CSW faxed patient information to [place].  2:00pm: CSW contacted [place] and was informed by Benjamine Mola that her supervisor is reviewing the Pt's information at this time.  Benjamine Mola states that she will call the CSW back once they have a determination on admission.   2:20pm: CSW spoke with Benjamine Mola at [facility] treatment who states that the Pt has been denied due to having injections for her Diabetes.  Benjamine Mola states "we are not mandated to be able to have people here that need injections because they handle their own medications".  Benjamine Mola states that she will Sales promotion account executive G at [employer] in [place] and inform her of this information.

## 2022-08-10 NOTE — Group Note (Signed)
Occupational Therapy Group Note  Group Topic:Coping Skills  Group Date: 08/10/2022 Start Time: 1400 End Time: 1440 Facilitators: Taytum Wheller G, OT   Group Description: Group encouraged increased engagement and participation through discussion and activity focused on "Coping Ahead." Patients were split up into teams and selected a card from a stack of positive coping strategies. Patients were instructed to act out/charade the coping skill for other peers to guess and receive points for their team. Discussion followed with a focus on identifying additional positive coping strategies and patients shared how they were going to cope ahead over the weekend while continuing hospitalization stay.  Therapeutic Goal(s): Identify positive vs negative coping strategies. Identify coping skills to be used during hospitalization vs coping skills outside of hospital/at home Increase participation in therapeutic group environment and promote engagement in treatment   Participation Level: Active   Participation Quality: Independent   Behavior: Appropriate   Speech/Thought Process: Coherent   Affect/Mood: Appropriate   Insight: Fair   Judgement: Fair   Individualization: pt was active in their participation of group discussion/activity. New skills identified  Modes of Intervention: Discussion and Education  Patient Response to Interventions:  Attentive   Plan: Continue to engage patient in OT groups 2 - 3x/week.  08/10/2022  Tully Burgo G Janeli Lewison, OT Don Tiu, OT   

## 2022-08-10 NOTE — Group Note (Signed)
LCSW Group Therapy Note  Group Date: 08/10/2022 Start Time: 1300 End Time: 1347   Type of Therapy and Topic:  Group Therapy: Anger Cues and Responses  Participation Level:  Did Not Attend   Description of Group:   In this group, patients learned how to recognize the physical, cognitive, emotional, and behavioral responses they have to anger-provoking situations.  They identified a recent time they became angry and how they reacted.  They analyzed how their reaction was possibly beneficial and how it was possibly unhelpful.  The group discussed a variety of healthier coping skills that could help with such a situation in the future.  Focus was placed on how helpful it is to recognize the underlying emotions to our anger, because working on those can lead to a more permanent solution as well as our ability to focus on the important rather than the urgent.  Therapeutic Goals: Patients will remember their last incident of anger and how they felt emotionally and physically, what their thoughts were at the time, and how they behaved. Patients will identify how their behavior at that time worked for them, as well as how it worked against them. Patients will explore possible new behaviors to use in future anger situations. Patients will learn that anger itself is normal and cannot be eliminated, and that healthier reactions can assist with resolving conflict rather than worsening situations. Therapeutic Modalities:   Cognitive Behavioral Therapy    Windle Guard, LCSW 08/10/2022  3:55 PM

## 2022-08-10 NOTE — BH IP Treatment Plan (Signed)
Interdisciplinary Treatment and Diagnostic Plan Update  08/10/2022 Time of Session: 0830 Cindy Leonard MRN: [id number]  Principal Diagnosis: Schizoaffective disorder [facility])  Secondary Diagnoses: Principal Problem:   Schizoaffective disorder ([place]) Active Problems:   Generalized anxiety disorder   Insomnia   Current Medications:  Current Facility-Administered Medications  Medication Dose Route Frequency Provider Last Rate Last Admin   acetaminophen (TYLENOL) tablet 650 mg  650 mg Oral Q6H PRN Vesta Mixer, NP   650 mg at 08/09/22 1153   alum & mag hydroxide-simeth (MAALOX/MYLANTA) 200-200-20 MG/5ML suspension 30 mL  30 mL Oral Q4H PRN Vesta Mixer, NP       FLUoxetine (PROZAC) capsule 40 mg  40 mg Oral Daily Leonard, Cindy E, PA   40 mg at 08/10/22 0750   hydrOXYzine (ATARAX) tablet 10 mg  10 mg Oral TID PRN Vesta Mixer, NP   10 mg at 08/09/22 2108   insulin aspart (novoLOG) injection 0-15 Units  0-15 Units Subcutaneous TID WC Vesta Mixer, NP   3 Units at 08/10/22 0600   insulin aspart (novoLOG) injection 0-5 Units  0-5 Units Subcutaneous QHS Vesta Mixer, NP   2 Units at 08/09/22 2109   insulin glargine-yfgn (SEMGLEE) injection 60 Units  60 Units Subcutaneous QHS [name], MD   60 Units at 08/09/22 2109   metFORMIN (GLUCOPHAGE-XR) 24 hr tablet 500 mg  500 mg Oral BID WC Leonard, Cindy A, NP   500 mg at 08/10/22 0751   traZODone (DESYREL) tablet 50 mg  50 mg Oral QHS PRN Vesta Mixer, NP   50 mg at 08/09/22 2108   PTA Medications: Facility-Administered Medications Prior to Admission  Medication Dose Route Frequency Provider Last Rate Last Admin   haloperidol decanoate (HALDOL DECANOATE) 100 MG/ML injection 100 mg  100 mg Intramuscular Once Eulis Canner E, NP       Medications Prior to Admission  Medication Sig Dispense Refill Last Dose   Blood Glucose Monitoring Suppl (ACCU-CHEK AVIVA PLUS) w/Device KIT Use as directed 1 kit 0     cephALEXin (KEFLEX) 500 MG capsule Take 1 capsule (500 mg total) by mouth 4 (four) times daily. (Patient not taking: Reported on 08/02/2022) 28 capsule 0    FLUoxetine (PROZAC) 20 MG capsule Take 1 capsule (20 mg total) by mouth daily. 30 capsule 2    glucose blood (ACCU-CHEK AVIVA PLUS) test strip Use as instructed 100 each 12    haloperidol decanoate (HALDOL DECANOATE) 100 MG/ML injection Inject 1 mL (100 mg total) into the muscle every 28 (twenty-eight) days. 1 mL 11    hydrOXYzine (ATARAX) 10 MG tablet Take 1 tablet (10 mg total) by mouth 3 (three) times daily as needed. (Patient taking differently: Take 10 mg by mouth 3 (three) times daily as needed for anxiety.) 90 tablet 3    insulin glargine (LANTUS) 100 UNIT/ML Solostar Pen Inject 60 Units into the skin daily. 15 mL 0    Insulin Pen Needle 31G X 8 MM MISC Use as directed 100 each 6    Lancets (ACCU-CHEK SOFT TOUCH) lancets Use as instructed 100 each 12    metFORMIN (GLUCOPHAGE) 500 MG tablet Take 2 tablets (1,000 mg total) by mouth 2 (two) times daily with a meal. (Patient taking differently: Take 1,000 mg by mouth daily as needed (high blood sugar).) 120 tablet 6    Oxcarbazepine (TRILEPTAL) 300 MG tablet Take 1 tablet (300 mg total) by mouth 2 (two) times daily. 60 tablet 1    traZODone (DESYREL) 50  MG tablet Take 1 tablet (50 mg total) by mouth at bedtime as needed for sleep. 30 tablet 3     Patient Stressors: Financial difficulties   Health problems   Medication change or noncompliance   Occupational concerns   Substance abuse    Patient Strengths: Ability for insight  Active sense of humor  Average or above average intelligence  Financial means  Motivation for treatment/growth  Supportive family/friends   Treatment Modalities: Medication Management, Group therapy, Case management,  1 to 1 session with clinician, Psychoeducation, Recreational therapy.   Physician Treatment Plan for Primary Diagnosis: Schizoaffective disorder  ([place]) Long Term Goal(s): Improvement in symptoms so as ready for discharge   Short Term Goals: Ability to identify changes in lifestyle to reduce recurrence of condition will improve Ability to verbalize feelings will improve Ability to disclose and discuss suicidal ideas Ability to demonstrate self-control will improve Ability to identify and develop effective coping behaviors will improve  Medication Management: Evaluate patient's response, side effects, and tolerance of medication regimen.  Therapeutic Interventions: 1 to 1 sessions, Unit Group sessions and Medication administration.  Evaluation of Outcomes: Progressing  Physician Treatment Plan for Secondary Diagnosis: Principal Problem:   Schizoaffective disorder ([place]) Active Problems:   Generalized anxiety disorder   Insomnia  Long Term Goal(s): Improvement in symptoms so as ready for discharge   Short Term Goals: Ability to identify changes in lifestyle to reduce recurrence of condition will improve Ability to verbalize feelings will improve Ability to disclose and discuss suicidal ideas Ability to demonstrate self-control will improve Ability to identify and develop effective coping behaviors will improve     Medication Management: Evaluate patient's response, side effects, and tolerance of medication regimen.  Therapeutic Interventions: 1 to 1 sessions, Unit Group sessions and Medication administration.  Evaluation of Outcomes: Progressing   RN Treatment Plan for Primary Diagnosis: Schizoaffective disorder ([place]) Long Term Goal(s): Knowledge of disease and therapeutic regimen to maintain health will improve  Short Term Goals: Ability to remain free from injury will improve, Ability to verbalize frustration and anger appropriately will improve, Ability to demonstrate self-control, Ability to participate in decision making will improve, Ability to verbalize feelings will improve, Ability to disclose and discuss suicidal  ideas, Ability to identify and develop effective coping behaviors will improve, and Compliance with prescribed medications will improve  Medication Management: RN will administer medications as ordered by provider, will assess and evaluate patient's response and provide education to patient for prescribed medication. RN will report any adverse and/or side effects to prescribing provider.  Therapeutic Interventions: 1 on 1 counseling sessions, Psychoeducation, Medication administration, Evaluate responses to treatment, Monitor vital signs and CBGs as ordered, Perform/monitor CIWA, COWS, AIMS and Fall Risk screenings as ordered, Perform wound care treatments as ordered.  Evaluation of Outcomes: Progressing   LCSW Treatment Plan for Primary Diagnosis: Schizoaffective disorder ([place]) Long Term Goal(s): Safe transition to appropriate next level of care at discharge, Engage patient in therapeutic group addressing interpersonal concerns.  Short Term Goals: Engage patient in aftercare planning with referrals and resources, Increase social support, Increase ability to appropriately verbalize feelings, Increase emotional regulation, Facilitate acceptance of mental health diagnosis and concerns, Facilitate patient progression through stages of change regarding substance use diagnoses and concerns, Identify triggers associated with mental health/substance abuse issues, and Increase skills for wellness and recovery  Therapeutic Interventions: Assess for all discharge needs, 1 to 1 time with Social worker, Explore available resources and support systems, Assess for adequacy in community support  network, Educate family and significant other(s) on suicide prevention, Complete Psychosocial Assessment, Interpersonal group therapy.  Evaluation of Outcomes: Progressing   Progress in Treatment: Attending groups: No. Participating in groups: No. Taking medication as prescribed: Yes. Toleration medication:  Yes. Family/Significant other contact made: No, will contact:  Patient has denied consent to reach family/friend.  Patient understands diagnosis: Yes. Discussing patient identified problems/goals with staff: Yes. Medical problems stabilized or resolved: Yes. Denies suicidal/homicidal ideation: Yes. Issues/concerns per patient self-inventory: No. Other: none  New problem(s) identified: No, Describe:  none  New Short Term/Long Term Goal(s): Patient to work towards medication management for mood stabilization; elimination of SI thoughts; development of comprehensive mental wellness plan.  Patient Goals:  No additional goals identified at this time. Patient to continue to work towards original goals identified in initial treatment team meeting. CSW will remain available to patient should they voice additional treatment goals.   Discharge Plan or Barriers: No psychosocial barriers identified at this time, patient to return to place of residence when appropriate for discharge.   Reason for Continuation of Hospitalization: Depression  Estimated Length of Stay: 1-7 days   Last 3 [place] Suicide Severity Risk Score: [place] Admission (Current) from 08/04/2022 in [place] [facility] ED from 08/02/2022 in [place] Office Visit from 07/28/2022 in [place] High Risk High Risk No Risk       Last PHQ 2/9 Scores:    07/28/2022    8:30 AM 12/04/2021    8:33 AM 12/03/2021   10:20 AM  Depression screen PHQ 2/9  Decreased Interest _0 Down, Depressed, Hopeless _1 PHQ - 2 Score _2 Altered sleeping _3 Tired, decreased energy 0 0 2  Change in appetite 3 0 2  Feeling bad or failure about yourself  _4 Trouble concentrating 0 0 2  Moving slowly or fidgety/restless 0 0 0  Suicidal thoughts 0 0 0  PHQ-9 Score _5 Difficult doing work/chores Not difficult  at all Very difficult Very difficult    Scribe for Treatment Team: Durenda Hurt, Latanya Presser 08/10/2022 10:49 AM

## 2022-08-10 NOTE — Plan of Care (Signed)
Nurse discussed anxiety, depression and coping skills. 

## 2022-08-11 ENCOUNTER — Ambulatory Visit (HOSPITAL_COMMUNITY): Payer: Medicaid Other | Admitting: Mental Health

## 2022-08-11 DIAGNOSIS — F259 Schizoaffective disorder, unspecified: Secondary | ICD-10-CM

## 2022-08-11 LAB — GLUCOSE, CAPILLARY
Glucose-Capillary: 144 mg/dL — ABNORMAL HIGH (ref 70–99)
Glucose-Capillary: 131 mg/dL — ABNORMAL HIGH (ref 70–99)

## 2022-08-11 MED ORDER — FLUOXETINE HCL 40 MG PO CAPS: 40.0000 mg | ORAL_CAPSULE | Freq: Every day | ORAL | 0 refills | Status: DC

## 2022-08-11 MED ORDER — METFORMIN HCL ER 500 MG PO TB24: 500.0000 mg | ORAL_TABLET | Freq: Two times a day (BID) | ORAL | 0 refills | Status: DC
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
## 2022-08-11 NOTE — Progress Notes (Signed)
Pt discharged at 12pm. Pt left facility by private vehicle. Pt removed all belongings, valuables, and verbalized understanding of medications and discharge instructions. Pt denies SI/HI/self harm thoughts as well as a/v hallucinations.

## 2022-08-11 NOTE — Plan of Care (Signed)
  Problem: Health Behavior/Discharge Planning: Goal: Ability to manage health-related needs will improve Outcome: Progressing   Problem: Clinical Measurements: Goal: Diagnostic test results will improve Outcome: Progressing   Problem: Coping: Goal: Level of anxiety will decrease Outcome: Progressing   Problem: Safety: Goal: Ability to remain free from injury will improve Outcome: Progressing   

## 2022-08-11 NOTE — Discharge Summary (Signed)
Physician Discharge Summary Note  Patient:  Cindy Leonard is an 26 y.o., female MRN:  [id number] DOB:  [birth year] Patient phone:  [phone] (home)  Patient address:   [address] [place] [address],  Total Time spent with patient: 15 minutes  Date of Admission:  08/04/2022 Date of Discharge: 08/11/2022  Reason for Admission:   Cindy Leonard. Luscher is a 26 year old, African-American female with a past psychiatric history significant for schizoaffective disorder (bipolar type), insomnia, and generalized anxiety disorder who was recently involuntarily admitted to [facility] after presenting to [facility] ED due to worsening depressive symptoms and suicidal ideation.   Prior to presenting to this current ED, patient states that she had been talking with her friend and expressing that she had suicidal ideations.  Patient states that she was informed by her friend to present to the ED due to her suicidal ideations.  Patient endorses suicidal thoughts with a plan to start cutting again.  Patient states that her last cutting episode occurred a year ago.  Patient states that whenever she would cut, she would cut her right wrist.  Patient states that her suicidal thoughts have been going on off and on for the last month, but states that the thoughts have been consistent for the last week and she does not want to be around anymore.   In addition to her suicidal ideations, patient endorses depression that she states has been going on "for a minute now."  Patient endorses the following depressive symptoms: feelings of sadness, lack of motivation, irritability, feelings of guilt/worthlessness, decreased energy, decreased appetite, decreased sleep, and difficulty getting out of bed.  She denies decreased concentration.  Patient also endorses anxiety and rates her anxiety as 7 out of 10.  Triggers to her anxiety include people.  When asked about stressors, patient endorsed general life  stressors and that she was tired of living.   During the assessment, patient reported that her Adderall injection was not working.  When asked the reasons for the medication not working, patient stated that she continued to experience worsening depressive symptoms.  Patient was informed that Haldol was not for the management of depressive symptoms.  Patient reports that the last time she heard voices was roughly a month ago.  Patient states that her last manic episode occurred back in April.   Principal Problem: Schizoaffective disorder [facility]) Discharge Diagnoses: Principal Problem:   Schizoaffective disorder ([place]) Active Problems:   Generalized anxiety disorder   Insomnia   Past Psychiatric History:  Patient has a past psychiatric history significant for schizoaffective disorder, generalized anxiety disorder, and insomnia.  Per chart review, patient was previously seen by Franne Grip, NP on 12/04/2021 at [facility].  During that encounter, patient endorsed command type hallucinations with voices that told her to cuss people out when they speak.  Patient had reported that she also acted on voices at times as well as being easily angered.  Patient reported a history of unprovoked violence when angered.  Patient was also last seen by Dr. Ronne Binning on 07/28/2022 at [facility].  During her latest encounter, patient stated that she was previously discharged from [facility] in [place].  Patient states that she was admitted from 9/22 to 9/25.  Patient reported that prior to her hospitalization admission, she was arrested due to issues with cocaine.  She reported that after her hospitalization, she no longer miss used cocaine or marijuana.  During her last encounter,  patient informed provider that her mood was stable and denied symptoms of psychosis.  During her last encounter: Patient was being managed on the following medications:  Past Medical  History:  Past Medical History:  Diagnosis Date   Bipolar 1 disorder ([place])    Depression    Diabetes mellitus without complication ([place])    Schizophrenia ([place])     Past Surgical History:  Procedure Laterality Date   INCISION AND DRAINAGE ABSCESS Left 12/08/2020   Procedure: INCISION AND DRAINAGE POSTERIOR NECK ABSCESS;  Surgeon: Greer Pickerel, MD;  Location: [facility];  Service: General;  Laterality: Left;   NO PAST SURGERIES     Family History:  Family History  Problem Relation Age of Onset   Stroke Mother    Diabetes Other    Hypertension Other    Asthma Father    Asthma Brother    Family Psychiatric  History:  Mother (maternal) - patient is  unsure of psychiatric issues Aunt (maternal) - past history of suicide attempt  Social History:  Social History   Substance and Sexual Activity  Alcohol Use No     Social History   Substance and Sexual Activity  Drug Use Yes   Types: Marijuana   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   Sexual activity: Yes    Birth control/protection: None  Other Topics Concern   Not on file  Social History Narrative   Not on file   Social Determinants of Health   Financial Resource Strain: Not on file  Food Insecurity: No Food Insecurity (08/04/2022)   Hunger Vital Sign    Worried About Running Out of Food in the Last Year: Never true    Ran Out of Food in the Last Year: Never true   Transportation Needs: Unknown (08/04/2022)   PRAPARE - Hydrologist (Medical): Patient refused    Lack of Transportation (Non-Medical): Patient refused  Physical Activity: Not on file  Stress: Not on file  Social Connections: Not on file    Hospital Course:    During the patient's hospitalization, patient had extensive initial psychiatric evaluation, and follow-up psychiatric evaluations every [place].   Psychiatric diagnoses provided upon initial assessment: Schizoaffective disorder, bipolar type    Patient's psychiatric medications were adjusted on admission:  Restarted Prozac 20 mg daily, titrated to 40 mg daily for depression Started on hydroxyzine 25 mg every 6 hours as needed for anxiety Started on trazodone 50 mg at bedtime as needed for insomnia   During the hospitalization, other adjustments were made to the patient's psychiatric medication regimen:  Stopped Trazodone 50 mg at bedtime Started hydroxyzine 50 mg at bedtime as needed for insomnia   Patient's care was discussed during the interdisciplinary team meeting every [place] during the hospitalization.  The patient denies having side effects to prescribed psychiatric medication.   Gradually, patient started adjusting to milieu. The patient was evaluated each [place] by a clinical provider to ascertain response to treatment. Improvement was noted by the patient's report of decreasing symptoms, improved sleep and appetite, affect, medication tolerance, behavior, and participation in unit programming.  Patient was asked each [place] to complete a self inventory noting mood, mental status, pain, new symptoms, anxiety and concerns.     Symptoms were reported as significantly decreased or resolved completely by discharge.    On [place] of discharge, the patient reports that their mood is stable. The patient denied having suicidal thoughts for more than 48 hours prior to discharge.  Patient denies having homicidal  thoughts.  Patient denies having auditory hallucinations.  Patient denies any visual hallucinations or other symptoms of psychosis. The patient was motivated to continue taking medication with a goal of continued improvement in mental health.    The patient reports their target psychiatric symptoms of depression and anxiety responded well to the psychiatric medications, and the patient reports overall benefit other psychiatric hospitalization. Supportive psychotherapy was provided to the patient. The patient also participated in regular group therapy while hospitalized. Coping skills, problem solving as well as relaxation therapies were also part of the unit programming.   Labs were reviewed with the patient, and abnormal results were discussed with the patient.   The patient is able to verbalize their individual safety plan to this provider.   # It is recommended to the patient to continue psychiatric medications as prescribed, after discharge from the hospital.     # It is recommended to the patient to follow up with your outpatient psychiatric provider and PCP.   # It was discussed with the patient, the impact of alcohol, drugs, tobacco have been there overall psychiatric and medical wellbeing, and total abstinence from substance use was recommended the patient.ed.   # Prescriptions provided or sent directly to preferred pharmacy at discharge. Patient agreeable to plan. Given opportunity to ask questions. Appears to feel comfortable with discharge.    # In the event of worsening symptoms, the patient is instructed to call the crisis hotline, 911 and or go to the nearest ED for appropriate evaluation and treatment of symptoms. To follow-up with primary care provider for other medical issues, concerns and or health care needs   # Patient was discharged [place], [place] with a plan to follow up as noted below.  Physical Findings: AIMS: Facial and Oral Movements Muscles of Facial Expression: None,  normal Lips and Perioral Area: None, normal Jaw: None, normal Tongue: None, normal,Extremity Movements Upper (arms, wrists, hands, fingers): None, normal Lower (legs, knees, ankles, toes): None, normal, Trunk Movements Neck, shoulders, hips: None, normal, Overall Severity Severity of abnormal movements (highest score from questions above): None, normal Incapacitation due to abnormal movements: None, normal Patient's awareness of abnormal movements (rate only patient's report): No Awareness, Dental Status Current problems with teeth and/or dentures?: No Does patient usually wear dentures?: No  CIWA:    COWS:     Musculoskeletal: Strength & Muscle Tone: within normal limits Gait & Station: normal Patient leans: N/A   Psychiatric Specialty Exam:  Presentation  General Appearance:  Appropriate for Environment; Casual  Eye Contact: Good  Speech: Clear and Coherent; Normal Rate  Speech Volume: Normal  Handedness: Ambidextrous   Mood and Affect  Mood: Euthymic  Affect: Appropriate   Thought Process  Thought Processes: Coherent; Goal Directed; Linear  Descriptions of Associations:Intact  Orientation:Full (Time, Place and Person)  Thought Content:Logical; WDL  History of Schizophrenia/Schizoaffective disorder:Yes  Duration of Psychotic Symptoms:No data recorded Hallucinations:Hallucinations: None  Ideas of Reference:None  Suicidal Thoughts:Suicidal Thoughts: No  Homicidal Thoughts:Homicidal Thoughts: No   Sensorium  Memory: Immediate Good; Recent Good; Remote Good  Judgment: Good  Insight: Good   Executive Functions  Concentration: Good  Attention Span: Good  Recall: Good  Fund of Knowledge: Good  Language: Good   Psychomotor Activity  Psychomotor Activity: Psychomotor Activity: Normal   Assets  Assets: Communication Skills; Desire for Improvement; Physical Health; Resilience   Sleep  Sleep: Sleep:  Good    Physical Exam: Physical Exam Constitutional:      Appearance: Normal appearance.  HENT:     Head: Normocephalic and atraumatic.     Nose: Nose normal.     Mouth/Throat:     Mouth: Mucous membranes are moist.  Eyes:     Extraocular Movements: Extraocular movements intact.     Pupils: Pupils are equal, round, and reactive to light.  Cardiovascular:     Rate and Rhythm: Normal rate and regular rhythm.  Pulmonary:     Effort: Pulmonary effort is normal.     Breath sounds: Normal breath sounds.  Abdominal:     General: Abdomen is flat.  Musculoskeletal:        General: Normal range of motion.     Cervical back: Normal range of motion and neck supple.  Skin:    General: Skin is warm and dry.  Neurological:     General: No focal deficit present.     Mental Status: She is alert and oriented to person, place, and time.  Psychiatric:        Attention and Perception: Attention and perception normal. She does not perceive auditory or visual hallucinations.        Mood and Affect: Mood and affect normal. Mood is not anxious or depressed.        Speech: Speech normal.        Behavior: Behavior normal. Behavior is cooperative.        Thought Content: Thought content normal. Thought content is not paranoid or delusional. Thought content does not include homicidal or suicidal ideation.        Cognition and Memory: Cognition and memory normal.        Judgment: Judgment normal.    Review of Systems  Constitutional: Negative.   HENT: Negative.    Eyes: Negative.   Respiratory: Negative.    Cardiovascular: Negative.   Gastrointestinal: Negative.   Musculoskeletal: Negative.   Skin: Negative.   Neurological: Negative.   Psychiatric/Behavioral:  Negative for depression, hallucinations, substance abuse and suicidal ideas. The patient is not nervous/anxious and does not have insomnia.    Blood pressure 130/78, pulse 92, temperature 98.2 F (36.8 C), temperature source Oral, resp.  rate 17, height '5\' 7"'  (1.702 m), weight (!) 137.3 kg, SpO2 98 %. Body mass index is 47.41 kg/m.   Social History   Tobacco Use  Smoking Status Never  Smokeless Tobacco Never  Tobacco Comments   Used with Marijuana   Tobacco Cessation:  N/A, patient does not currently use tobacco products   Blood Alcohol level:  Lab Results  Component Value Date   ETH <10 08/02/2022   ETH <10 82/70/7867    Metabolic Disorder Labs:  Lab Results  Component Value Date   HGBA1C 11.5 (H) 08/06/2022   MPG 283.35 08/06/2022   MPG  312.05 02/11/2022   Lab Results  Component Value Date   PROLACTIN 11.9 02/11/2022   Lab Results  Component Value Date   CHOL 136 08/06/2022   TRIG 266 (H) 08/06/2022   HDL 23 (L) 08/06/2022   CHOLHDL 5.9 08/06/2022   VLDL 53 (H) 08/06/2022   LDLCALC 60 08/06/2022   LDLCALC UNABLE TO CALCULATE IF TRIGLYCERIDE OVER 400 mg/dL 02/11/2022    See Psychiatric Specialty Exam and Suicide Risk Assessment completed by Attending Physician prior to discharge.  Discharge destination:  Other:  shelter located in [place], [place]  Is patient on multiple antipsychotic therapies at discharge:  No   Has Patient had three or more failed trials of antipsychotic monotherapy by history:  No  Recommended Plan for Multiple Antipsychotic Therapies: NA  Discharge Instructions     Diet - low sodium heart healthy   Complete by: As directed    Increase activity slowly   Complete by: As directed       Allergies as of 08/11/2022       Reactions   Blueberry [vaccinium Angustifolium] Anaphylaxis        Medication List     STOP taking these medications    cephALEXin 500 MG capsule Commonly known as: KEFLEX   metFORMIN 500 MG tablet Commonly known as: Glucophage Replaced by: metFORMIN 500 MG 24 hr tablet   Oxcarbazepine 300 MG tablet Commonly known as: Trileptal       TAKE these medications      Indication  Accu-Chek Aviva Plus test strip Generic drug: glucose  blood Use as instructed  Indication: Diabetes   Accu-Chek Aviva Plus w/Device Kit Use as directed  Indication: Diabetes   accu-chek soft touch lancets Use as instructed  Indication: Diabetes   B-D ULTRAFINE III SHORT PEN 31G X 8 MM Misc Generic drug: Insulin Pen Needle Use as directed  Indication: diabetes   FLUoxetine 40 MG capsule Commonly known as: PROZAC Take 1 capsule (40 mg total) by mouth daily. Start taking on: August 12, 2022 What changed:  medication strength how much to take  Indication: Generalized Anxiety Disorder   haloperidol decanoate 100 MG/ML injection Commonly known as: HALDOL DECANOATE Inject 1 mL (100 mg total) into the muscle every 28 (twenty-eight) days.  Indication: Schizophrenia   hydrOXYzine 10 MG tablet Commonly known as: ATARAX Take 1 tablet (10 mg total) by mouth 3 (three) times daily as needed. What changed: reasons to take this  Indication: Feeling Anxious   insulin glargine 100 UNIT/ML Solostar Pen Commonly known as: LANTUS Inject 60 Units into the skin daily.  Indication: Type 2 Diabetes   metFORMIN 500 MG 24 hr tablet Commonly known as: GLUCOPHAGE-XR Take 1 tablet (500 mg total) by mouth 2 (two) times daily with a meal. Replaces: metFORMIN 500 MG tablet  Indication: Antipsychotic Therapy-Induced Weight Gain, Type 2 Diabetes   traZODone 50 MG tablet Commonly known as: DESYREL Take 1 tablet (50 mg total) by mouth at bedtime as needed for sleep.  Indication: [facility] Follow up on 08/11/2022.   Specialty: Behavioral Health Why: You have an appointment for therapy services on 08/11/22 at 9:00 am. Contact information: [place]        Ladell Pier, MD. Go on 09/03/2022.   Specialty: Internal Medicine Why: You have a hospital follow up appointment for medication management services on  09/03/22 at 2:30 pm.  This appointment will be held in person.  (You are on the cancellation list for a sooner appointment). Contact information: [place] [address] [phone]                 Follow-up recommendations:   Activity: As tolerated  Diet: Heart healthy  Other: -Follow-up with your outpatient psychiatric provider -instructions on appointment date, time, and address (location) are provided to you in discharge paperwork.   -Take your psychiatric medications as prescribed at discharge - instructions are provided to you in the discharge paperwork.   -Follow-up with outpatient primary care doctor and other specialists -for management of preventative medicine and chronic medical disease, including: diabetes   -Testing: Follow-up with outpatient provider for abnormal lab results:  none   -Recommend abstinence from alcohol, tobacco, and other illicit drug use at discharge.    -If your psychiatric symptoms recur, worsen, or if you have side effects to your psychiatric medications, call your outpatient psychiatric provider, 911, [phone] or go to the nearest emergency department.   -If suicidal thoughts recur, call your outpatient psychiatric provider, 911, [phone] or go to the nearest emergency department.  Signed: Malachy Mood, PA 08/11/2022, 11:49 AM

## 2022-08-11 NOTE — Progress Notes (Signed)
  [facility] Adult Case Management Discharge Plan :  Will you be returning to the same living situation after discharge:  No, Shelter  At discharge, do you have transportation home?: Yes,  Friend  Do you have the ability to pay for your medications: Yes,  Medicaid   Release of information consent forms completed and in the chart;  Patient's signature needed at discharge.  Patient to Follow up at:  [facility] Follow up on 08/11/2022.   Specialty: Behavioral Health Why: You have an appointment for therapy services on 08/11/22 at 9:00 am. Contact information: [place]        Ladell Pier, MD. Go on 09/03/2022.   Specialty: Internal Medicine Why: You have a hospital follow up appointment for medication management services on 09/03/22 at 2:30 pm.  This appointment will be held in person.  (You are on the cancellation list for a sooner appointment). Contact information: [place] [address] [phone]                 Next level of care provider has access to [place] and Suicide Prevention discussed: Yes,  with patient      Has patient been referred to the Quitline?: N/A patient is not a smoker  Patient has been referred for addiction treatment: Yes, Patient was referred to [facility] but was denied due to Insulin Injections.   Darleen Crocker, LCSWA 08/11/2022, 9:24 AM

## 2022-08-11 NOTE — Progress Notes (Signed)
D-Patient alert and oriented. Denies SI, HI, AVH, and pain.   A-Scheduled and PRN medications administered to patient, per MD orders. Support and encouragement provided.  Routine safety checks conducted every 15 minutes.  Patient informed to notify staff with problems or concerns.  R- No adverse drug reactions noted. PRN meds produced results per indications.  Patient contracts for safety at this time. Patient compliant with medications and treatment plan. Patient receptive, calm, and cooperative. Patient interacts well with others on the unit.  Patient remains safe at this time.

## 2022-08-11 NOTE — Progress Notes (Signed)
   08/11/22 0515  Sleep  Number of Hours 7.25

## 2022-08-25 ENCOUNTER — Ambulatory Visit (INDEPENDENT_AMBULATORY_CARE_PROVIDER_SITE_OTHER): Payer: Medicaid Other | Admitting: Psychiatry

## 2022-08-25 ENCOUNTER — Encounter (HOSPITAL_COMMUNITY): Payer: Self-pay | Admitting: Psychiatry

## 2022-08-25 ENCOUNTER — Encounter (HOSPITAL_COMMUNITY): Payer: Self-pay

## 2022-08-25 ENCOUNTER — Ambulatory Visit (INDEPENDENT_AMBULATORY_CARE_PROVIDER_SITE_OTHER): Payer: Medicaid Other | Admitting: *Deleted

## 2022-08-25 VITALS — BP 125/87 | HR 67 | Ht 71.0 in | Wt 309.0 lb

## 2022-08-25 DIAGNOSIS — F250 Schizoaffective disorder, bipolar type: Secondary | ICD-10-CM

## 2022-08-25 DIAGNOSIS — F411 Generalized anxiety disorder: Secondary | ICD-10-CM

## 2022-08-25 DIAGNOSIS — F5105 Insomnia due to other mental disorder: Secondary | ICD-10-CM

## 2022-08-25 MED ORDER — HALOPERIDOL DECANOATE 100 MG/ML IM SOLN: 100.0000 mg | INTRAMUSCULAR | 11 refills | Status: AC

## 2022-08-25 MED ORDER — HYDROXYZINE HCL 10 MG PO TABS: 10.0000 mg | ORAL_TABLET | Freq: Three times a day (TID) | ORAL | 3 refills | Status: AC | PRN

## 2022-08-25 MED ORDER — HALOPERIDOL DECANOATE 100 MG/ML IM SOLN
100.0000 mg | Freq: Once | INTRAMUSCULAR | Status: AC
  Administered 2022-08-25: 100 mg via INTRAMUSCULAR

## 2022-08-25 MED ORDER — TRAZODONE HCL 50 MG PO TABS: 50.0000 mg | ORAL_TABLET | Freq: Every evening | ORAL | 3 refills | Status: AC | PRN

## 2022-08-25 MED ORDER — FLUOXETINE HCL 40 MG PO CAPS: 40.0000 mg | ORAL_CAPSULE | Freq: Every day | ORAL | 0 refills | Status: AC

## 2022-08-25 NOTE — Progress Notes (Signed)
BH MD/PA/NP OP Progress Note  08/25/2022 11:28 AM Cindy Leonard  MRN:  [id number]  Chief Complaint: "I feel better since being in the hospital"  HPI:  26 year old female seen today for follow-up psychiatric evaluation.  She has a psychiatric history of schizoaffective disorder bipolar type, insomnia, anxiety, bipolar affective disorder, SI/HI, cocaine use, and marijuana use.  Currently she is managed on Haldol decanoate 100 mg monthly, Prozac 40 mg daily, hydroxyzine 25 mg three times daily as needed, trazodone 50 mg nighty as needed.  Patient was admitted at [facility] on 08/04/2022 through 08/11/2022 where she presented with increased depression and suicidal ideation.  Patient notes that since her hospitalization she is feeling better.    Today she is well-groomed, pleasant, cooperative, and engaged in conversation.  She informed Probation officer that her mood is more stable since her hospitalization.  She notes that she has less stress in her life.  Patient notes that she soon started a new job at [employer].  She also informed Probation officer that she has moved into an [place] home and is receiving therapy regularly.  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  Today she denies SI/HI/AVH, mania, paranoia.  Patient notes at times her appetite is poor but informed writer that she has gained some weight.  She also notes that since being in a new environment her sleep patterns has reduced.  Provider asked patient if she wanted trazodone increased however she notes that she does not.    Patient received her monthly Haldol injection today and denied injection site pain.  No medication changes made today.  Patient agreeable to continue medications as prescribed.  No other concerns at this time.     Visit Diagnosis:    ICD-10-CM   1. Schizoaffective disorder, bipolar type ([place])  F25.0 haloperidol decanoate (HALDOL DECANOATE) 100 MG/ML injection    2. 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  F51.05 traZODone (DESYREL) 50 MG tablet      Past Psychiatric History: schizoaffective disorder bipolar type, insomnia, anxiety, bipolar affective disorder, SI/HI, cocaine use, and marijuana use  Past Medical History:  Past Medical History:  Diagnosis Date   Bipolar 1 disorder ([place])    Depression    Diabetes mellitus without complication ([place])    Schizophrenia ([place])     Past Surgical History:  Procedure Laterality Date   INCISION AND DRAINAGE ABSCESS Left 12/08/2020   Procedure: INCISION AND DRAINAGE POSTERIOR NECK ABSCESS;  Surgeon: Greer Pickerel, MD;  Location: [place];  Service: General;  Laterality: Left;   NO PAST SURGERIES      Family Psychiatric History:  Maternal aunt: schizophrenia and Grandmother: current crack cocaine use  Family History:  Family History  Problem Relation Age of Onset   Stroke Mother    Diabetes Other    Hypertension Other    Asthma Father    Asthma Brother     Social History:  Social History   Socioeconomic History   Marital status: Single    Spouse name: Not on file   Number of children: Not on file   Years of education: Not on file   Highest education level: Not on file  Occupational History   Not on file  Tobacco Use   Smoking status: Never   Smokeless tobacco: Never   Tobacco comments:    Used with Marijuana  Vaping  Use   Vaping Use: Never used  Substance and Sexual Activity   Alcohol use: No   Drug use: Yes    Types: Marijuana    Comment: clean of cocaine x 3 yrs. Daily Marijuana use   Sexual activity: Yes    Birth control/protection: None  Other Topics Concern   Not on file  Social History Narrative   Not on file   Social Determinants of Health   Financial Resource Strain: Not on file  Food Insecurity: No Food Insecurity (08/04/2022)   Hunger Vital Sign    Worried About Running Out of Food in the Last Year: Never true     Ran Out of Food in the Last Year: Never true  Transportation Needs: Unknown (08/04/2022)   PRAPARE - Hydrologist (Medical): Patient refused    Lack of Transportation (Non-Medical): Patient refused  Physical Activity: Not on file  Stress: Not on file  Social Connections: Not on file    Allergies:  Allergies  Allergen Reactions   Blueberry [Vaccinium Angustifolium] Anaphylaxis    Metabolic Disorder Labs: Lab Results  Component Value Date   HGBA1C 11.5 (H) 08/06/2022   MPG 283.35 08/06/2022   MPG 312.05 02/11/2022   Lab Results  Component Value Date   PROLACTIN 11.9 02/11/2022   Lab Results  Component Value Date   CHOL 136 08/06/2022   TRIG 266 (H) 08/06/2022   HDL 23 (L) 08/06/2022   CHOLHDL 5.9 08/06/2022   VLDL 53 (H) 08/06/2022   LDLCALC 60 08/06/2022   LDLCALC UNABLE TO CALCULATE IF TRIGLYCERIDE OVER 400 mg/dL 02/11/2022   Lab Results  Component Value Date   TSH 1.906 02/11/2022    Therapeutic Level Labs: No results found for: "LITHIUM" No results found for: "VALPROATE" No results found for: "CBMZ"  Current Medications: Current Outpatient Medications  Medication Sig Dispense Refill   Blood Glucose Monitoring Suppl (ACCU-CHEK AVIVA PLUS) w/Device KIT Use as directed 1 kit 0   FLUoxetine (PROZAC) 40 MG capsule Take 1 capsule (40 mg total) by mouth daily. 30 capsule 0   glucose blood (ACCU-CHEK AVIVA PLUS) test strip Use as instructed 100 each 12   haloperidol decanoate (HALDOL DECANOATE) 100 MG/ML injection Inject 1 mL (100 mg total) into the muscle every 28 (twenty-eight) days. 1 mL 11   hydrOXYzine (ATARAX) 10 MG tablet Take 1 tablet (10 mg total) by mouth 3 (three) times daily as needed. 90 tablet 3   insulin glargine (LANTUS) 100 UNIT/ML Solostar Pen Inject 60 Units into the skin daily. 15 mL 0   Insulin Pen Needle 31G X 8 MM MISC Use as directed 100 each 6   Lancets (ACCU-CHEK SOFT TOUCH) lancets Use as instructed 100 each 12    metFORMIN (GLUCOPHAGE-XR) 500 MG 24 hr tablet Take 1 tablet (500 mg total) by mouth 2 (two) times daily with a meal. 60 tablet 0   traZODone (DESYREL) 50 MG tablet Take 1 tablet (50 mg total) by mouth at bedtime as needed for sleep. 30 tablet 3   Current Facility-Administered Medications  Medication Dose Route Frequency Provider Last Rate Last Admin   haloperidol decanoate (HALDOL DECANOATE) 100 MG/ML injection 100 mg  100 mg Intramuscular Once Salley Slaughter, NP         Musculoskeletal: Strength & Muscle Tone: within normal limits Gait & Station: normal Patient leans: N/A  Psychiatric Specialty Exam: Review of Systems  There were no vitals taken for this visit.There is no height  or weight on file to calculate BMI.  General Appearance: Well Groomed  Eye Contact:  Good  Speech:  Clear and Coherent and Normal Rate  Volume:  Normal  Mood:  Euthymic  Affect:  Appropriate and Congruent  Thought Process:  Coherent, Goal Directed, and Linear  Orientation:  Full (Time, Place, and Person)  Thought Content: WDL and Logical   Suicidal Thoughts:  No  Homicidal Thoughts:  No  Memory:  Immediate;   Good Recent;   Good Remote;   Good  Judgement:  Good  Insight:  Good  Psychomotor Activity:  Normal  Concentration:  Concentration: Good and Attention Span: Good  Recall:  Good  Fund of Knowledge: Good  Language: Good  Akathisia:  No  Handed:  Right  AIMS (if indicated): not done  Assets:  Communication Skills Desire for Improvement Financial Resources/Insurance Housing Physical Health Social Support  ADL's:  Intact  Cognition: WNL  Sleep:  Fair   Screenings: AIMS    [place] Admission (Discharged) from 08/04/2022 in [facility]  AIMS Total Score 0      AUDIT    Flowsheet Row Admission (Discharged) from 08/04/2022 in [facility] [facility] Admission (Discharged) from 04/04/2020 in [place]  Alcohol Use Disorder Identification Test Final Score (AUDIT) 0 0      GAD-7    Flowsheet Row Office Visit from 07/28/2022 in [facility] Counselor from 12/03/2021 in [facility] Office Visit from 04/24/2021 in [place] Office Visit from 12/30/2020 in [place] Office Visit from 09/12/2018 in [place]  Total GAD-7 Score _0 0 15      PHQ2-9    [place] Office Visit from 07/28/2022 in [facility] Office Visit from 12/04/2021 in [facility] Counselor from 12/03/2021 in [facility] Office Visit from 04/24/2021 in [place] Office Visit from 12/30/2020 in [place]  PHQ-2 Total Score _1 0 0  PHQ-9 Total Score _2 -- --      Flowsheet Row Admission (Discharged) from 08/04/2022 in [place] [facility] ED from 08/02/2022 in [place] Office Visit from 07/28/2022 in [place] CATEGORY High Risk High Risk No Risk        Assessment and Plan: Patient informed writer that at times she has difficulty sleeping due to being in a new environment.  She however notes that her mood, anxiety, and depression are stable.  She reports being sober from alcohol since September.  No medication changes today.  Patient agreeable to continue medications.  1. Schizoaffective disorder, bipolar type ([place])  Continue- haloperidol decanoate (HALDOL DECANOATE) 100 MG/ML injection; Inject 1 mL (100 mg total) into the muscle every 28 (twenty-eight) days.  Dispense: 1 mL; Refill: 11  2. Generalized anxiety disorder  Continue- hydrOXYzine (ATARAX) 10 MG tablet; Take 1 tablet (10 mg total) by mouth 3  (three) times daily as needed.  Dispense: 90 tablet; Refill: 3 Continue- traZODone (DESYREL) 50 MG tablet; Take 1 tablet (50 mg total) by mouth at bedtime as needed for sleep.  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  Dispense: 30 tablet; Refill: 3   Collaboration of Care: Collaboration of Care: Other provider involved in patient's care AEB PCP  Patient/Guardian was advised Release of Information must be obtained prior to any record release in order to collaborate their care with an outside provider. Patient/Guardian was advised if they have not already done so to contact the registration department to sign all necessary forms in order for [place] to release information regarding their care.   Consent: Patient/Guardian gives verbal consent for treatment and assignment of benefits for services provided during this visit. Patient/Guardian expressed understanding and agreed to proceed.   Follow-up in 3 months Follow-up with shot clinic in a month Salley Slaughter, NP 08/25/2022, 11:28 AM

## 2022-08-25 NOTE — Progress Notes (Signed)
In for her first injection of Haldol D 100 mg at this clinic. She had recently been at the [facility] hospital and gotten an haldol shot there. She is pleasant, denies any psychotic sx, states meds are working well and has recently moved to a recovery house in [place]. She got her shot in her R DELTOID today without issue. She is to return in 28 days. She was seen today also by Eulis Canner DNP as this is her first shot appt.

## 2022-09-03 ENCOUNTER — Encounter: Payer: Self-pay | Admitting: Internal Medicine

## 2022-09-03 ENCOUNTER — Ambulatory Visit: Payer: Medicaid Other | Attending: Internal Medicine | Admitting: Internal Medicine

## 2022-09-03 DIAGNOSIS — R030 Elevated blood-pressure reading, without diagnosis of hypertension: Secondary | ICD-10-CM | POA: Diagnosis not present

## 2022-09-03 DIAGNOSIS — Z2821 Immunization not carried out because of patient refusal: Secondary | ICD-10-CM | POA: Diagnosis not present

## 2022-09-03 DIAGNOSIS — F250 Schizoaffective disorder, bipolar type: Secondary | ICD-10-CM | POA: Diagnosis not present

## 2022-09-03 DIAGNOSIS — F319 Bipolar disorder, unspecified: Secondary | ICD-10-CM | POA: Insufficient documentation

## 2022-09-03 DIAGNOSIS — Z6841 Body Mass Index (BMI) 40.0 and over, adult: Secondary | ICD-10-CM | POA: Diagnosis not present

## 2022-09-03 DIAGNOSIS — E1169 Type 2 diabetes mellitus with other specified complication: Secondary | ICD-10-CM | POA: Diagnosis not present

## 2022-09-03 DIAGNOSIS — E1165 Type 2 diabetes mellitus with hyperglycemia: Secondary | ICD-10-CM | POA: Diagnosis present

## 2022-09-03 MED ORDER — FREESTYLE LIBRE SENSOR SYSTEM MISC: 12 refills | Status: DC

## 2022-09-03 MED ORDER — TRULICITY 0.75 MG/0.5ML ~~LOC~~ SOAJ: 0.7500 mg | SUBCUTANEOUS | 3 refills | Status: DC

## 2022-09-03 MED ORDER — METFORMIN HCL ER 500 MG PO TB24: 500.0000 mg | ORAL_TABLET | Freq: Two times a day (BID) | ORAL | 1 refills | Status: DC

## 2022-09-03 MED ORDER — INSULIN GLARGINE 100 UNIT/ML SOLOSTAR PEN: 60.0000 [IU] | PEN_INJECTOR | Freq: Every day | SUBCUTANEOUS | 3 refills | Status: DC

## 2022-09-03 NOTE — Patient Instructions (Signed)
Start Trulicity as discussed today. Once you start the Trulicity, if you find that your blood sugars start to fall below 90, please cut back on the Lantus insulin to 50 units daily.

## 2022-09-03 NOTE — Progress Notes (Signed)
Patient ID: Cindy Leonard, female    DOB: 1995/12/01  MRN: [id number]  CC: hosp f/u and DM   Subjective: Cindy Leonard is a 26 y.o. female who presents for hosp f/u Her concerns today include:  Patient with history of DM type II, morbid obesity,  bipolar 1/schizophrenia/depression,   Pt was hosp for psychiatric admission 10/3-08/2022 for worsening depression and suicidal ideation.  She was stabilized on medication and discharge with follow-up arranged with [facility] behavioral health.  She is seen her mental health provider postdischarge on 08/25/2022. -She tells me today that she is feeling much better.  Denies any suicidal ideation at this time.  No hallucinations.  Currently on Haldol injection, Prozac and hydroxyzine.  She feels stable on these medicines.  DM: Lab Results  Component Value Date   HGBA1C 11.5 (H) 08/06/2022  Prior to hospitalization, patient states she had stopped taking metformin and insulin due to depression over her job loss.  These medicines were restarted while she was in the hospital and she is continue taking them consistently since hospital discharge. She is on glargine insulin 60 units daily and metformin 500 mg twice a [place]. -Reports blood sugars are better.  Checking 3 times a [place] before meals.  Range has been 107-1 130s.  No readings over 200 in the past 2 weeks.  Eats a light breakfast, usually skips lunch and has a large dinner.  Drinks mainly water.  She does not drink soda or sweet tea.  Blood pressure elevated today.  She feels that she gets anxious when she comes to doctor's visit.  Declines flu shot.  Due for Pap smear.   Patient Active Problem List   Diagnosis Date Noted   Schizoaffective disorder ([place]) 08/04/2022   Generalized anxiety disorder 08/04/2022   Insomnia 08/04/2022   Influenza vaccine refused 12/30/2020   COVID-19 vaccine series declined 12/30/2020   Uncontrolled type 2 diabetes mellitus with hyperglycemia ([place])     Abscess, neck 12/08/2020   Urinary tract infection without hematuria    Suicidal ideation 07/21/2020   Pneumonia due to COVID-19 virus    Homicidal ideation    Bipolar affective disorder, current episode manic ([place]) 04/04/2020   Diabetes ([place]) 04/04/2020   DKA (diabetic ketoacidoses) 03/28/2020   Depression 03/28/2020   Hyponatremia 03/28/2020   Elevated blood pressure reading 09/14/2018   Acne 09/14/2018   Morbid obesity ([place]) 03/22/2017   Hx of bipolar disorder 03/22/2017   Schizophrenia ([place]) 03/22/2017     Current Outpatient Medications on File Prior to Visit  Medication Sig Dispense Refill   Blood Glucose Monitoring Suppl (ACCU-CHEK AVIVA PLUS) w/Device KIT Use as directed 1 kit 0   FLUoxetine (PROZAC) 40 MG capsule Take 1 capsule (40 mg total) by mouth daily. 30 capsule 0   glucose blood (ACCU-CHEK AVIVA PLUS) test strip Use as instructed 100 each 12   haloperidol decanoate (HALDOL DECANOATE) 100 MG/ML injection Inject 1 mL (100 mg total) into the muscle every 28 (twenty-eight) days. 1 mL 11   hydrOXYzine (ATARAX) 10 MG tablet Take 1 tablet (10 mg total) by mouth 3 (three) times daily as needed. 90 tablet 3   Insulin Pen Needle 31G X 8 MM MISC Use as directed 100 each 6   Lancets (ACCU-CHEK SOFT TOUCH) lancets Use as instructed 100 each 12   traZODone (DESYREL) 50 MG tablet Take 1 tablet (50 mg total) by mouth at bedtime as needed for sleep. 30 tablet 3   No current facility-administered  medications on file prior to visit.    Allergies  Allergen Reactions   Blueberry [Vaccinium Angustifolium] Anaphylaxis    Social History   Socioeconomic History   Marital status: Single    Spouse name: Not on file   Number of children: Not on file   Years of education: Not on file   Highest education level: Not on file  Occupational History   Not on file  Tobacco Use   Smoking status: Never   Smokeless tobacco: Never   Tobacco comments:    Used with Marijuana  Vaping Use    Vaping Use: Never used  Substance and Sexual Activity   Alcohol use: No   Drug use: Yes    Types: Marijuana    Comment: clean of cocaine x 3 yrs. Daily Marijuana use   Sexual activity: Yes    Birth control/protection: None  Other Topics Concern   Not on file  Social History Narrative   Not on file   Social Determinants of Health   Financial Resource Strain: Not on file  Food Insecurity: No Food Insecurity (08/04/2022)   Hunger Vital Sign    Worried About Running Out of Food in the Last Year: Never true    Ran Out of Food in the Last Year: Never true  Transportation Needs: Unknown (08/04/2022)   PRAPARE - Hydrologist (Medical): Patient refused    Lack of Transportation (Non-Medical): Patient refused  Physical Activity: Not on file  Stress: Not on file  Social Connections: Not on file  Intimate Partner Violence: Unknown (08/04/2022)   Humiliation, Afraid, Rape, and Kick questionnaire    Fear of Current or Ex-Partner: Patient refused    Emotionally Abused: Patient refused    Physically Abused: Patient refused    Sexually Abused: Patient refused    Family History  Problem Relation Age of Onset   Stroke Mother    Diabetes Other    Hypertension Other    Asthma Father    Asthma Brother     Past Surgical History:  Procedure Laterality Date   INCISION AND DRAINAGE ABSCESS Left 12/08/2020   Procedure: INCISION AND DRAINAGE POSTERIOR NECK ABSCESS;  Surgeon: Greer Pickerel, MD;  Location: [facility];  Service: General;  Laterality: Left;   NO PAST SURGERIES      ROS: Review of Systems Negative except as stated above  PHYSICAL EXAM: BP (!) 124/90   Pulse 72   Temp 98 F (36.7 C) (Temporal)   Ht _0  (1.702 m)   Wt (!) 314 lb 3.2 oz (142.5 kg)   LMP  (LMP Unknown)   SpO2 98%   BMI 49.21 kg/m   Wt Readings from Last 3 Encounters:  09/03/22 (!) 314 lb 3.2 oz (142.5 kg)  08/25/22 (!) 309 lb (140.2 kg)  08/04/22 (!) 302 lb 11.2 oz (137.3 kg)     Physical Exam  General appearance - alert, well appearing, morbidly obese young African-American female and in no distress Mental status - normal mood, behavior, speech, dress, motor activity, and thought processes Neck - supple, no significant adenopathy Chest - clear to auscultation, no wheezes, rales or rhonchi, symmetric air entry Heart - normal rate, regular rhythm, normal S1, S2, no murmurs, rubs, clicks or gallops Extremities - peripheral pulses normal, no pedal edema, no clubbing or cyanosis     09/03/2022    2:47 PM 08/25/2022   11:36 AM 07/28/2022    8:30 AM  Depression screen PHQ 2/9  Decreased Interest 0 0 2  Down, Depressed, Hopeless 0 0 1  PHQ - 2 Score 0 0 3  Altered sleeping _0 Tired, decreased energy 1 1 0  Change in appetite 1 0 3  Feeling bad or failure about yourself  0 0 2  Trouble concentrating 0 0 0  Moving slowly or fidgety/restless 0 0 0  Suicidal thoughts 0 0 0  PHQ-9 Score _1 Difficult doing work/chores  Not difficult at all Not difficult at all       Latest Ref Rng & Units 08/06/2022    6:40 PM 08/02/2022    1:02 PM 07/20/2022    7:18 PM  CMP  Glucose 70 - 99 mg/dL 159  290    BUN 6 - 20 mg/dL 14  11    Creatinine 0.44 - 1.00 mg/dL 1.10  0.91    Sodium 135 - 145 mmol/L 138  131  139   Potassium 3.5 - 5.1 mmol/L 3.8  3.6  4.1   Chloride 98 - 111 mmol/L 106  102    CO2 22 - 32 mmol/L 27  24    Calcium 8.9 - 10.3 mg/dL 9.2  8.8    Total Protein 6.5 - 8.1 g/dL 7.1  7.2    Total Bilirubin 0.3 - 1.2 mg/dL 0.3  0.5    Alkaline Phos 38 - 126 U/L 63  55    AST 15 - 41 U/L 15  16    ALT 0 - 44 U/L 21  19     Lipid Panel     Component Value Date/Time   CHOL 136 08/06/2022 1840   TRIG 266 (H) 08/06/2022 1840   HDL 23 (L) 08/06/2022 1840   CHOLHDL 5.9 08/06/2022 1840   VLDL 53 (H) 08/06/2022 1840   LDLCALC 60 08/06/2022 1840   LDLDIRECT 74.6 02/11/2022 1325    CBC    Component Value Date/Time   WBC 11.1 (H) 08/06/2022 1840    RBC 4.90 08/06/2022 1840   HGB 13.7 08/06/2022 1840   HCT 42.0 08/06/2022 1840   PLT 271 08/06/2022 1840   MCV 85.7 08/06/2022 1840   MCH 28.0 08/06/2022 1840   MCHC 32.6 08/06/2022 1840   RDW 13.3 08/06/2022 1840   LYMPHSABS 4.3 (H) 08/06/2022 1840   MONOABS 0.7 08/06/2022 1840   EOSABS 0.3 08/06/2022 1840   BASOSABS 0.1 08/06/2022 1840    ASSESSMENT AND PLAN: 1. Type 2 diabetes mellitus with morbid obesity ([place]) Commended her on trying to change her eating habits and for taking her medicines consistently since she has been discharged from the hospital. -Discussed trying her with Trulicity which we had done in the past.  Patient states she tolerated the medicine but just fell off the wagon with taking medications at that time.  She would like to try the Trulicity again to help with weight loss.  Went over with her how the medication works and possible side effects.  Advised that the medicine can sometimes cause pancreatitis or bowel blockage.  Advised if she develops any pain in the upper abdomen and vomiting, she should stop the medicine and come in to be seen.  She declines seeing the clinical pharmacist for teaching on administration stating that she remembers how to administer the Trulicity. -Once she starts Trulicity, advised that if blood sugars start to drop below 90, she should decrease the dose of her insulin from 60 units daily to 50 units daily.  We  may eventually be able to get her off the insulin or significantly reduce the dose. -Discussed on encourage healthy eating habits and regular exercise - insulin glargine (LANTUS) 100 UNIT/ML Solostar Pen; Inject 60 Units into the skin daily.  Dispense: 15 mL; Refill: 3 - metFORMIN (GLUCOPHAGE-XR) 500 MG 24 hr tablet; Take 1 tablet (500 mg total) by mouth 2 (two) times daily with a meal.  Dispense: 180 tablet; Refill: 1 - Continuous Blood Gluc Sensor (FREESTYLE LIBRE SENSOR SYSTEM) MISC; Change sensor Q 2 wks  Dispense: 2 each; Refill:  12 - Dulaglutide (TRULICITY) 9.89 QJ/1.9ER SOPN; Inject 0.75 mg into the skin once a week.  Dispense: 2 mL; Refill: 3 - Microalbumin / creatinine urine ratio; Future  2. Elevated blood pressure reading in office without diagnosis of hypertension DASH diet discussed and encouraged.  We will recheck blood pressure on subsequent visit  3. Schizoaffective disorder, bipolar type (HCC) Stable on current medications  4. Influenza vaccination declined   Patient was given the opportunity to ask questions.  Patient verbalized understanding of the plan and was able to repeat key elements of the plan.   This documentation was completed using Radio producer.  Any transcriptional errors are unintentional.  Orders Placed This Encounter  Procedures   Microalbumin / creatinine urine ratio     Requested Prescriptions   Signed Prescriptions Disp Refills   insulin glargine (LANTUS) 100 UNIT/ML Solostar Pen 15 mL 3    Sig: Inject 60 Units into the skin daily.   metFORMIN (GLUCOPHAGE-XR) 500 MG 24 hr tablet 180 tablet 1    Sig: Take 1 tablet (500 mg total) by mouth 2 (two) times daily with a meal.   Continuous Blood Gluc Sensor (FREESTYLE LIBRE SENSOR SYSTEM) MISC 2 each 12    Sig: Change sensor Q 2 wks   Dulaglutide (TRULICITY) 7.40 CX/4.4YJ SOPN 2 mL 3    Sig: Inject 0.75 mg into the skin once a week.    Return in about 6 weeks (around 10/15/2022) for PAP and DM.  Karle Plumber, MD, FACP

## 2022-09-07 ENCOUNTER — Other Ambulatory Visit: Payer: Self-pay

## 2022-09-07 ENCOUNTER — Other Ambulatory Visit: Payer: Self-pay | Admitting: Pharmacist

## 2022-09-07 MED ORDER — FREESTYLE LIBRE 2 READER DEVI: 0 refills | Status: DC

## 2022-09-07 MED ORDER — FREESTYLE LIBRE 2 SENSOR MISC: 3 refills | Status: DC

## 2022-09-08 ENCOUNTER — Other Ambulatory Visit: Payer: Self-pay

## 2022-09-22 ENCOUNTER — Ambulatory Visit (HOSPITAL_COMMUNITY): Payer: Medicaid Other

## 2022-10-15 ENCOUNTER — Ambulatory Visit: Payer: Medicaid Other | Admitting: Internal Medicine

## 2022-10-20 ENCOUNTER — Other Ambulatory Visit: Payer: Self-pay

## 2022-10-20 ENCOUNTER — Encounter (HOSPITAL_COMMUNITY): Payer: Medicaid Other | Admitting: Psychiatry

## 2022-11-22 ENCOUNTER — Other Ambulatory Visit: Payer: Self-pay

## 2022-11-22 ENCOUNTER — Emergency Department
Admission: EM | Admit: 2022-11-22 | Discharge: 2022-11-23 | Disposition: A | Discharge status: Other Acute Inpatient Hospital | Payer: Medicaid Other | Attending: Emergency Medicine | Admitting: Emergency Medicine

## 2022-11-22 DIAGNOSIS — F3170 Bipolar disorder, currently in remission, most recent episode unspecified: Secondary | ICD-10-CM | POA: Insufficient documentation

## 2022-11-22 DIAGNOSIS — R45851 Suicidal ideations: Secondary | ICD-10-CM | POA: Diagnosis not present

## 2022-11-22 DIAGNOSIS — E119 Type 2 diabetes mellitus without complications: Secondary | ICD-10-CM | POA: Diagnosis not present

## 2022-11-22 DIAGNOSIS — F250 Schizoaffective disorder, bipolar type: Secondary | ICD-10-CM | POA: Diagnosis present

## 2022-11-22 DIAGNOSIS — F319 Bipolar disorder, unspecified: Secondary | ICD-10-CM

## 2022-11-22 DIAGNOSIS — Z20822 Contact with and (suspected) exposure to covid-19: Secondary | ICD-10-CM | POA: Diagnosis not present

## 2022-11-22 DIAGNOSIS — Z046 Encounter for general psychiatric examination, requested by authority: Secondary | ICD-10-CM | POA: Diagnosis present

## 2022-11-22 DIAGNOSIS — R4689 Other symptoms and signs involving appearance and behavior: Secondary | ICD-10-CM

## 2022-11-22 LAB — COMPREHENSIVE METABOLIC PANEL
ALT: 19 U/L (ref 0–44)
AST: 16 U/L (ref 15–41)
Albumin: 3.8 g/dL (ref 3.5–5.0)
Alkaline Phosphatase: 52 U/L (ref 38–126)
Anion gap: 9 (ref 5–15)
BUN: 11 mg/dL (ref 6–20)
CO2: 24 mmol/L (ref 22–32)
Calcium: 9 mg/dL (ref 8.9–10.3)
Chloride: 102 mmol/L (ref 98–111)
Creatinine, Ser: 0.88 mg/dL (ref 0.44–1.00)
GFR, Estimated: >60 mL/min (ref >60)
Glucose, Bld: 308 mg/dL — ABNORMAL HIGH (ref 70–99)
Potassium: 4.1 mmol/L (ref 3.5–5.1)
Sodium: 135 mmol/L (ref 135–145)
Total Bilirubin: 0.6 mg/dL (ref 0.3–1.2)
Total Protein: 7.6 g/dL (ref 6.5–8.1)

## 2022-11-22 LAB — CBC
HCT: 43 % (ref 36.0–46.0)
Hemoglobin: 14.3 g/dL (ref 12.0–15.0)
MCH: 28 pg (ref 26.0–34.0)
MCHC: 33.3 g/dL (ref 30.0–36.0)
MCV: 84.3 fL (ref 80.0–100.0)
Platelets: 275 10*3/uL (ref 150–400)
RBC: 5.1 MIL/uL (ref 3.87–5.11)
RDW: 13.3 % (ref 11.5–15.5)
WBC: 7.7 10*3/uL (ref 4.0–10.5)
nRBC: 0 % (ref 0.0–0.2)

## 2022-11-22 LAB — CBG MONITORING, ED
Glucose-Capillary: 308 mg/dL — ABNORMAL HIGH (ref 70–99)
Glucose-Capillary: 260 mg/dL — ABNORMAL HIGH (ref 70–99)
Glucose-Capillary: 196 mg/dL — ABNORMAL HIGH (ref 70–99)

## 2022-11-22 LAB — ETHANOL: Alcohol, Ethyl (B): <10 mg/dL (ref <10)

## 2022-11-22 LAB — URINE DRUG SCREEN, QUALITATIVE (ARMC ONLY)
Amphetamines, Ur Screen: NOT DETECTED
Barbiturates, Ur Screen: NOT DETECTED
Benzodiazepine, Ur Scrn: NOT DETECTED
Cannabinoid 50 Ng, Ur ~~LOC~~: NOT DETECTED
Cocaine Metabolite,Ur ~~LOC~~: NOT DETECTED
MDMA (Ecstasy)Ur Screen: NOT DETECTED
Methadone Scn, Ur: NOT DETECTED
Opiate, Ur Screen: NOT DETECTED
Phencyclidine (PCP) Ur S: NOT DETECTED
Tricyclic, Ur Screen: NOT DETECTED

## 2022-11-22 LAB — POC URINE PREG, ED: Preg Test, Ur: NEGATIVE

## 2022-11-22 LAB — RESP PANEL BY RT-PCR (RSV, FLU A&B, COVID)  RVPGX2
Influenza A by PCR: NEGATIVE
Influenza B by PCR: NEGATIVE
Resp Syncytial Virus by PCR: NEGATIVE
SARS Coronavirus 2 by RT PCR: NEGATIVE

## 2022-11-22 LAB — ACETAMINOPHEN LEVEL: Acetaminophen (Tylenol), Serum: <10 ug/mL — ABNORMAL LOW (ref 10–30)

## 2022-11-22 LAB — SALICYLATE LEVEL: Salicylate Lvl: <7 mg/dL — ABNORMAL LOW (ref 7.0–30.0)

## 2022-11-22 MED ORDER — DROPERIDOL 2.5 MG/ML IJ SOLN
5.0000 mg | Freq: Once | INTRAMUSCULAR | Status: AC
  Administered 2022-11-22: 5 mg via INTRAMUSCULAR

## 2022-11-22 MED ORDER — HALOPERIDOL 5 MG PO TABS
5.0000 mg | ORAL_TABLET | Freq: Two times a day (BID) | ORAL | Status: DC
  Administered 2022-11-22 – 2022-11-23 (×3): 5 mg via ORAL
  Filled 2022-11-22 (×3): qty 1

## 2022-11-22 MED ORDER — METFORMIN HCL ER 500 MG PO TB24
500.0000 mg | ORAL_TABLET | Freq: Two times a day (BID) | ORAL | Status: DC
  Filled 2022-11-22 (×2): qty 1

## 2022-11-22 MED ORDER — HYDROXYZINE HCL 10 MG PO TABS: 10.0000 mg | ORAL_TABLET | Freq: Three times a day (TID) | ORAL | Status: DC | PRN

## 2022-11-22 MED ORDER — INSULIN GLARGINE-YFGN 100 UNIT/ML ~~LOC~~ SOLN
60.0000 [IU] | Freq: Every day | SUBCUTANEOUS | Status: DC
  Filled 2022-11-22: qty 0.6

## 2022-11-22 MED ORDER — BENZTROPINE MESYLATE 1 MG PO TABS
1.0000 mg | ORAL_TABLET | Freq: Every day | ORAL | Status: DC
  Administered 2022-11-22 – 2022-11-23 (×2): 1 mg via ORAL
  Filled 2022-11-22 (×2): qty 1

## 2022-11-22 MED ORDER — TRAZODONE HCL 100 MG PO TABS: 50.0000 mg | ORAL_TABLET | Freq: Every evening | ORAL | Status: DC | PRN

## 2022-11-22 NOTE — BH Assessment (Addendum)
Referral information for Psychiatric Hospitalization faxed to:  [facility] ([phone]) Per AC, no beds available at this time; however TTS can follow up on bed status after discharges tomorrow 11/23/22.   [facility] [phone]),  [address] [phone]),   [place] [phone] -or- [phone]),   [facility] [phone])  [facility] [phone])  [facility] [phone])

## 2022-11-22 NOTE — ED Notes (Signed)
Pt states she is a diabetic and takes novolog, but has not taken her meds in weeks due to partying. Pt refusing cbg check and escalating when asked. Pt encouraged to comply for her health and well being but pt refusing and states she does not want to be touched. BPD remain in room. Pt sitting on bed.

## 2022-11-22 NOTE — ED Notes (Signed)
Patient Items;  Daisy Socks Green Pants Black Leather Jacket White Crocs Light Blue T- Shirt Panties

## 2022-11-22 NOTE — Consult Note (Signed)
[facility] Face-to-Face Psychiatry Consult   Reason for Consult:  aggression, off medications Referring Physician:  EDP Patient Identification: Cindy Leonard MRN:  [id number] Principal Diagnosis: Schizoaffective disorder, bipolar type ([place]) Diagnosis:  Principal Problem:   Schizoaffective disorder, bipolar type ([place])   Total Time spent with patient: 45 minutes  Subjective:   Cindy Leonard is a 27 y.o. female with history of schizoaffective disorder, bipolar type brought in IVC by [employer] following incident in which patient was reportedly being aggressive and violent toward her family.   HPI:  On evaluation, patient is sitting up at the side of the bed, asking for handcuffs to be removed. When asked why she was brought to the ED, she stated "I don't want to talk about that." Patient stated she gets haldol dec injections and did not know when she had received the last one. Denied depressed mood, anxiety symptoms, hallucinations, and substance use.   Collateral from mother, Kristeen Miss Dimaio: patient was doing well at [facility], taking medications, and had a job. Per outpatient records, patient received last haldol dec injection on 08/25/22. Mother reported that patient recently began sleeping all [place], was using ETOH and (unknown) drugs, and was not adherent to medications. Although [facility] was working with her to find housing, mother said patient refused to pack and ready herself to move out. Prior to the current admission, mother stated she had blocked the patient from contacting her on her phone, which she believes "set her off," and she "trashed the house." Mother plans to get a no contact order from the court tomorrow and said patient is not allowed back in her home.   Past Psychiatric History: schizoaffective, bipolar disorder  Risk to Self:  yes Risk to Others:  none Prior Inpatient Therapy:  multiple  Prior Outpatient Therapy:  [facility]  Past Medical History:  Past Medical History:  Diagnosis Date    Bipolar 1 disorder ([place])    Depression    Diabetes mellitus without complication ([address])    Schizophrenia ([place])     Past Surgical History:  Procedure Laterality Date   INCISION AND DRAINAGE ABSCESS Left 12/08/2020   Procedure: INCISION AND DRAINAGE POSTERIOR NECK ABSCESS;  Surgeon: Greer Pickerel, MD;  Location: [facility];  Service: General;  Laterality: Left;   NO PAST SURGERIES     Family History:  Family History  Problem Relation Age of Onset   Stroke Mother    Diabetes Other    Hypertension Other    Asthma Father    Asthma Brother    Family Psychiatric  History: none Social History:  Social History   Substance and Sexual Activity  Alcohol Use No     Social History   Substance and Sexual Activity  Drug Use Yes   Types: Marijuana   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 Daily Marijuana use   Sexual activity: Yes    Birth control/protection: None  Other Topics Concern   Not on file  Social History Narrative   Not on file   Social Determinants of Health   Financial Resource Strain: Not on file  Food Insecurity: No Food Insecurity (08/04/2022)   Hunger Vital Sign    Worried About Running Out of Food in the Last Year: Never true    Ran Out of Food in the Last Year: Never true  Transportation Needs: Unknown (08/04/2022)   PRAPARE - Administrator, Civil Service (Medical): Patient refused    Lack of Transportation (Non-Medical): Patient  refused  Physical Activity: Not on file  Stress: Not on file  Social Connections: Not on file   Additional Social History:    Allergies:   Allergies  Allergen Reactions   Blueberry [Vaccinium Angustifolium] Anaphylaxis    Labs:  Results for orders placed or performed during the hospital encounter of 11/22/22 (from the past 48 hour(s))  CBG monitoring, ED     Status: Abnormal   Collection Time: 11/22/22  2:22 PM  Result Value Ref Range   Glucose-Capillary 308 (H) 70 - 99 mg/dL    Comment: Glucose reference range applies only to samples taken after fasting for at least 8 hours.  cbc     Status: None   Collection Time: 11/22/22  2:30 PM  Result Value Ref Range   WBC 7.7 4.0 - 10.5 K/uL   RBC 5.10 3.87 - 5.11 MIL/uL   Hemoglobin 14.3 12.0 - 15.0 g/dL   HCT 78.2 95.6 - 21.3 %   MCV 84.3 80.0 - 100.0 fL   MCH 28.0 26.0 - 34.0 pg   MCHC 33.3 30.0 - 36.0 g/dL   RDW 08.6 57.8 - 46.9 %   Platelets 275 150 - 400 K/uL   nRBC 0.0 0.0 - 0.2 %    Comment: Performed at [facility], [address], [place], [place] [address]    Current Facility-Administered Medications  Medication Dose Route Frequency Provider Last Rate Last Admin   benztropine (COGENTIN) tablet 1 mg  1 mg Oral Daily Sebron Mcmahill Y, NP       haloperidol (HALDOL) tablet 5 mg  5 mg Oral BID Charm Rings, NP       metFORMIN (GLUCOPHAGE-XR) 24 hr tablet 500 mg  500 mg Oral BID WC Charm Rings, NP       traZODone (DESYREL) tablet 50 mg  50 mg Oral QHS PRN Charm Rings, NP       Current Outpatient Medications  Medication Sig Dispense Refill   Blood Glucose Monitoring Suppl (ACCU-CHEK AVIVA PLUS) w/Device KIT Use as directed 1 kit 0   Continuous Blood Gluc Receiver (FREESTYLE LIBRE 2 READER) DEVI Use to check blood sugaar three times daily. E11.69 1 each 0   Continuous Blood Gluc Sensor (FREESTYLE LIBRE 2 SENSOR) MISC Use to check blood sugaar three times daily. Change sensors once every 14 days. E11.69 2  each 3   Dulaglutide (TRULICITY) 0.75 MG/0.5ML SOPN Inject 0.75 mg into the skin once a week. 2 mL 3   FLUoxetine (PROZAC) 40 MG capsule Take 1 capsule (40 mg total) by mouth daily. 30 capsule 0   glucose blood (ACCU-CHEK AVIVA PLUS) test strip Use as instructed 100 each 12   haloperidol decanoate (HALDOL DECANOATE) 100 MG/ML injection Inject 1 mL (100 mg total) into the muscle every 28 (twenty-eight)  days. 1 mL 11   hydrOXYzine (ATARAX) 10 MG tablet Take 1 tablet (10 mg total) by mouth 3 (three) times daily as needed. 90 tablet 3   insulin glargine (LANTUS) 100 UNIT/ML Solostar Pen Inject 60 Units into the skin daily. 15 mL 3   Insulin Pen Needle 31G X 8 MM MISC Use as directed 100 each 6   Lancets (ACCU-CHEK SOFT TOUCH) lancets Use as instructed 100 each 12   metFORMIN (GLUCOPHAGE-XR) 500 MG 24 hr tablet Take 1 tablet (500 mg total) by mouth 2 (two) times daily with a meal. 180 tablet 1   traZODone (DESYREL) 50 MG tablet Take 1 tablet (50 mg total) by mouth at bedtime as needed for sleep. 30 tablet 3    Musculoskeletal: Strength & Muscle Tone: within normal limits Gait & Station: normal Patient leans: N/A  Psychiatric Specialty Exam: Physical Exam Vitals and nursing note reviewed.  Constitutional:      Appearance: Normal appearance.  HENT:     Head: Normocephalic.     Nose: Nose normal.  Pulmonary:     Effort: Pulmonary effort is normal.  Musculoskeletal:        General: Normal range of motion.     Cervical back: Normal range of motion.  Neurological:     General: No focal deficit present.     Mental Status: She is alert and oriented to person, place, and time.  Psychiatric:        Attention and Perception: Attention and perception normal.        Mood and Affect: Affect is blunt.        Speech: Speech normal.        Behavior: Behavior is aggressive.        Thought Content: Thought content is paranoid and delusional.        Cognition and Memory: Cognition and memory normal.         Judgment: Judgment is impulsive.     Review of Systems  Psychiatric/Behavioral:  The patient is nervous/anxious.   All other systems reviewed and are negative.   There were no vitals taken for this visit.There is no height or weight on file to calculate BMI.  General Appearance: Casual and Disheveled  Eye Contact:  Fair  Speech:  Clear and Coherent  Volume:  Normal  Mood:  Anxious, irritated  Affect:  Congruent  Thought Process:  Coherent  Orientation:  Full (Time, Place, and Person)  Thought Content:  Negative  Suicidal Thoughts:  No  Homicidal Thoughts:  No  Memory:  Immediate;   Good  Judgement:  Fair  Insight:  Fair  Psychomotor Activity:  Normal  Concentration:  Concentration: Good and Attention Span: Good  Recall:  [employer] of Knowledge:  Fair  Language:  Good  Akathisia:  Negative  Handed:  Right  AIMS (if indicated):     Assets:  Communication Skills  ADL's:  Intact  Cognition:  WNL  Sleep:        Physical Exam: Physical Exam Vitals and nursing note reviewed.  Constitutional:      Appearance: Normal appearance.  HENT:     Head: Normocephalic.     Nose: Nose normal.  Pulmonary:     Effort: Pulmonary effort is normal.  Musculoskeletal:        General: Normal range of motion.     Cervical back: Normal range of motion.  Neurological:     General: No focal deficit present.     Mental  Status: She is alert and oriented to person, place, and time.  Psychiatric:        Attention and Perception: Attention and perception normal.        Mood and Affect: Affect is blunt.        Speech: Speech normal.        Behavior: Behavior is aggressive.        Thought Content: Thought content is paranoid and delusional.        Cognition and Memory: Cognition and memory normal.        Judgment: Judgment is impulsive.    Review of Systems  Psychiatric/Behavioral:  The patient is nervous/anxious.   All other systems reviewed and are negative.  There were no vitals  taken for this visit. There is no height or weight on file to calculate BMI.  Treatment Plan Summary: Daily contact with patient to assess and evaluate symptoms and progress in treatment, Medication management, and Plan : Schizoaffective disorder, bipolar type: Haldol 5 mg BID  EPS: Cogentin 1 mg daily  Disposition: Recommend psychiatric Inpatient admission when medically cleared.  Nanine Means, NP 11/22/2022 2:47 PM

## 2022-11-22 NOTE — ED Notes (Signed)
Pt. To BHU from ED ambulatory without difficulty, to room  BHU 4. Report from [facility]. Pt. Is alert and oriented, warm and dry in no distress. Pt. Denies SI, HI, and AVH. Pt. Calm and cooperative. Pt. Made aware of security cameras and Q15 minute rounds. Pt. Encouraged to let Nursing staff know of any concerns or needs.    ENVIRONMENTAL ASSESSMENT Potentially harmful objects out of patient reach: Yes.   Personal belongings secured: Yes.   Patient dressed in hospital provided attire only: Yes.   Plastic bags out of patient reach: Yes.   Patient care equipment (cords, cables, call bells, lines, and drains) shortened, removed, or accounted for: Yes.   Equipment and supplies removed from bottom of stretcher: Yes.   Potentially toxic materials out of patient reach: Yes.   Sharps container removed or out of patient reach: Yes.

## 2022-11-22 NOTE — ED Notes (Signed)
IVC, pend psych consult

## 2022-11-22 NOTE — ED Notes (Signed)
Pt. Moved to BHU-4
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
## 2022-11-22 NOTE — ED Notes (Signed)
Pt given dinner tray and drink at this time. 
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
## 2022-11-22 NOTE — ED Notes (Signed)
Pt agreeable to blood work and EKG , pt cooperative with changing clothes and blood work if she can eat afterwards. Pt dressed out and blood drawn without altercation, pt given water and crackers per request. Pt given warm blanket.
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
## 2022-11-23 LAB — CBG MONITORING, ED: Glucose-Capillary: 184 mg/dL — ABNORMAL HIGH (ref 70–99)

## 2022-11-23 NOTE — BH Assessment (Addendum)
Patient has been accepted to [facility] today 11/23/22.  Patient assigned to [address] Unit Accepting physician is Dr. Duffy Bruce.  Call report to [phone].  Representative was [employer]   ER Staff is aware of it:  Apolonio Schneiders, ER Secretary  Dr. Jori Moll, ER MD  Earlie Server, Patient's Nurse

## 2022-11-23 NOTE — ED Notes (Signed)
EMTALA reviewed by this RN and all required documents are up to date. Pt is ready for transfer.  

## 2022-11-23 NOTE — ED Notes (Signed)
Report called in to [facility], RN.

## 2022-11-23 NOTE — ED Notes (Signed)
[place]  [employer]  [employer]  called  for  transport  to  [facility] 

## 2022-11-23 NOTE — ED Provider Notes (Addendum)
Emergency Medicine Observation Re-evaluation Note  Cindy Leonard is a 27 y.o. female, seen on rounds today.  Pt initially presented to the ED for complaints of Aggressive Behavior  Currently, the patient is is no acute distress. Denies any concerns at this time.  Physical Exam  Blood pressure 138/81, pulse 61, temperature 98.2 F (36.8 C), temperature source Oral, resp. rate 16, SpO2 95 %.  Physical Exam: General: No apparent distress Pulm: Normal WOB Neuro: Moving all extremities Psych: Resting comfortably     ED Course / MDM     I have reviewed the labs performed to date as well as medications administered while in observation.  Recent changes in the last 24 hours include: No acute events overnight.  Plan   Current plan: Patient awaiting psychiatric disposition. Patient is under full IVC at this time.  Patient was accepted to an inpatient psychiatric facility.  Plan to be transferred to [facility] today.    Nathaniel Man, MD 11/23/22 4010    Nathaniel Man, MD 11/23/22 [phone]

## 2022-11-23 NOTE — ED Notes (Signed)
Pt transferring to [facility]. Advised pt of admission there and she verbalized understanding of this. Given all her personal belongings to be transported with pt. Stable, ambulatory and in NAD. Pt asked how she will get to [facility] and she was informed that the [employer] will transport her.

## 2022-11-23 NOTE — ED Notes (Signed)
ivc/psych consult ordered/pending.Marland Kitchen

## 2022-11-25 ENCOUNTER — Encounter (HOSPITAL_COMMUNITY): Payer: Medicaid Other

## 2023-01-09 IMAGING — CT CT CERVICAL SPINE W/O CM
3 of 4 series · 13 of 33 positions shown, 16 images · non-contrast
Comparison: None.

CLINICAL DATA: Neck pain beginning yesterday. Decreased range of
motion. Soft tissue swelling.

EXAM:
CT CERVICAL SPINE WITHOUT CONTRAST
TECHNIQUE: Multidetector CT imaging of the cervical spine was performed without
intravenous contrast. Multiplanar CT image reconstructions were also
generated.

[Series 4: c_spine 2.0 st · axial · 0.36mm/px · z∈[-160,-20]mm · 5 of 106 slices shown, 7 images]
[im 18/106  soft-tissue]
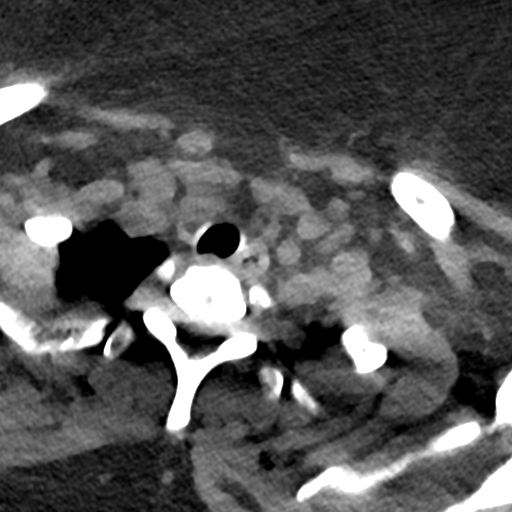
[im 18/106  bone]
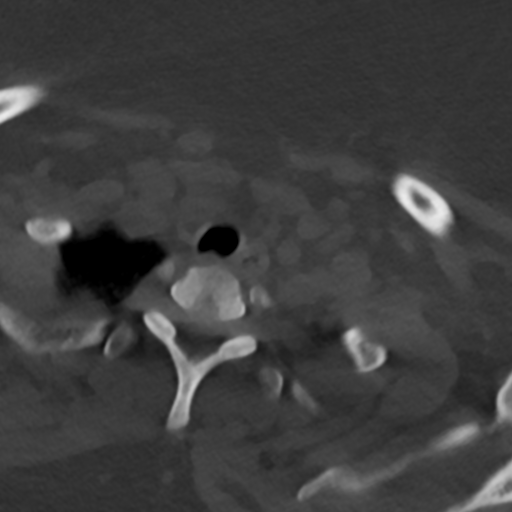
[im 36/106  bone]
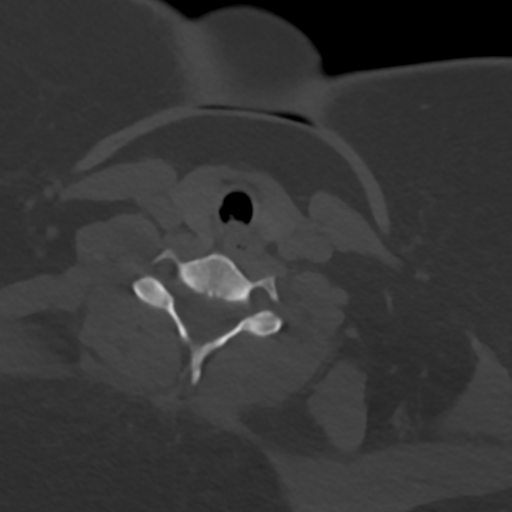
[im 53/106  bone]
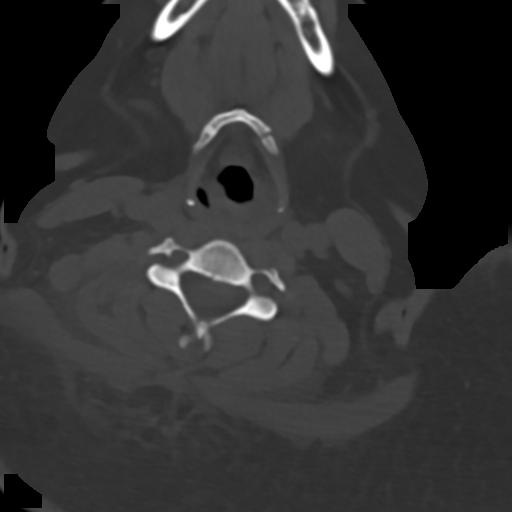
[im 71/106  bone]
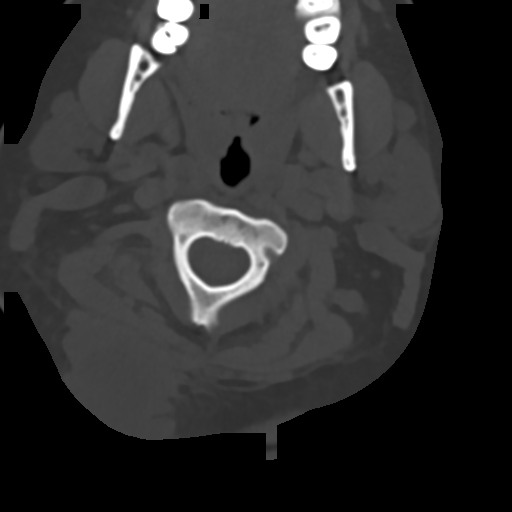
[im 88/106  soft-tissue]
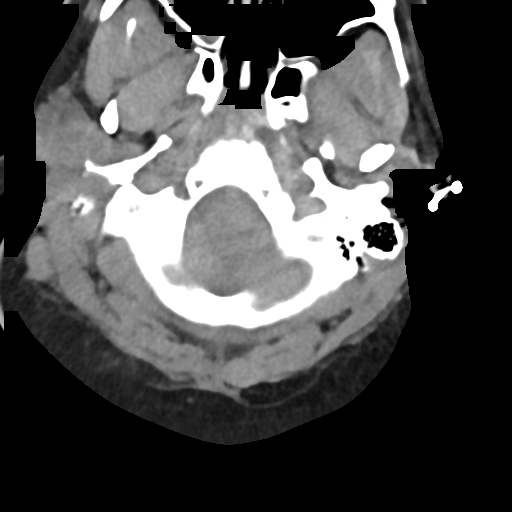
[im 88/106  bone]
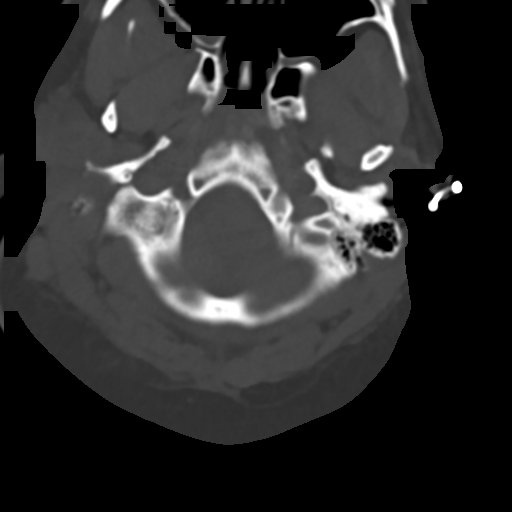

[Series 6: c_spine 2.0 sag bone · sagittal · 0.31mm/px · 5 of 61 slices shown, 6 images]
[im 21/61  bone]
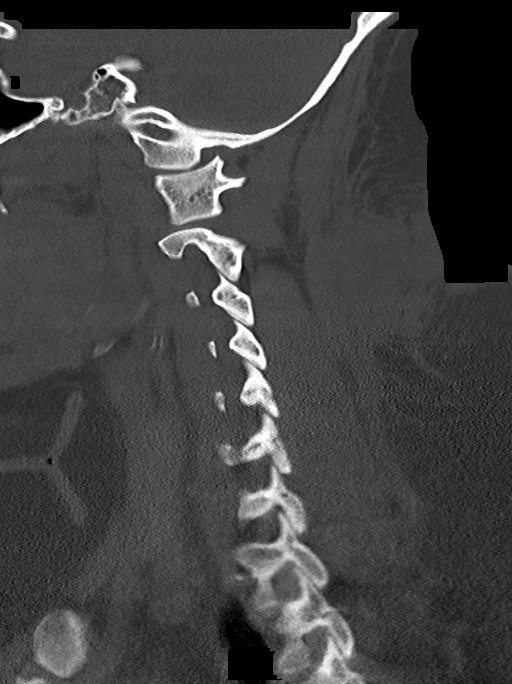
[im 26/61  bone]
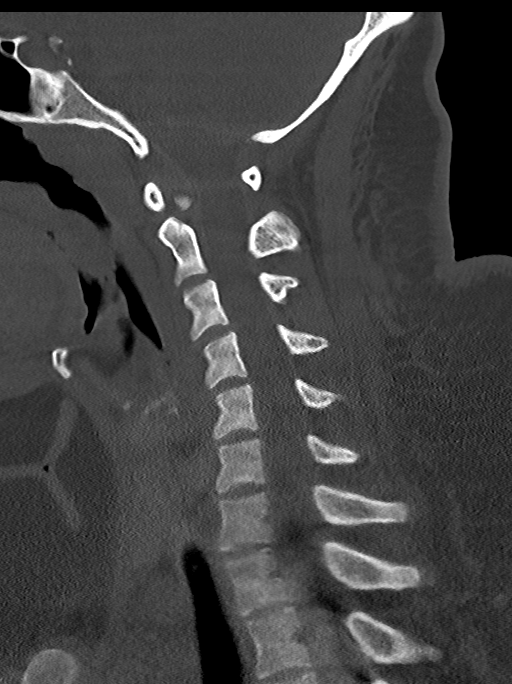
[im 31/61  soft-tissue]
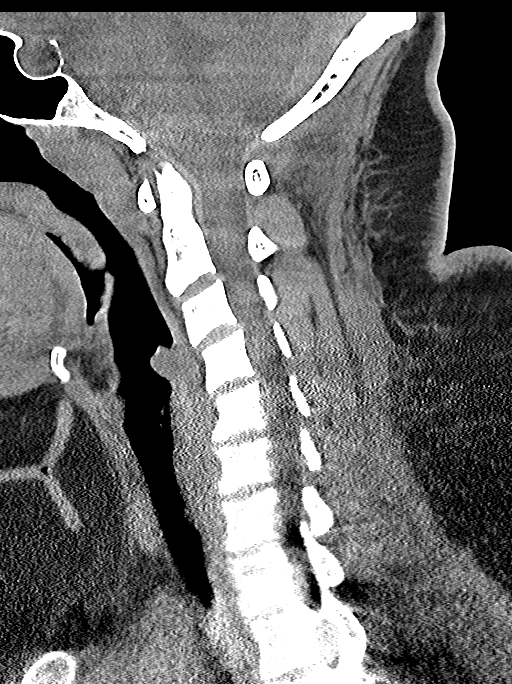
[im 31/61  bone]
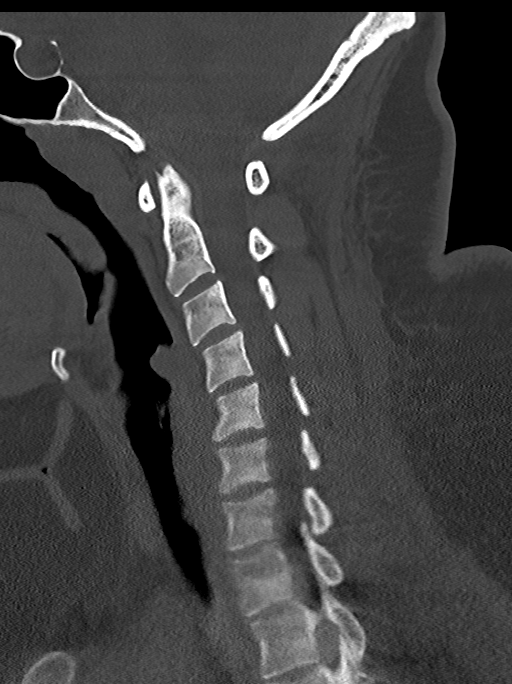
[im 36/61  bone]
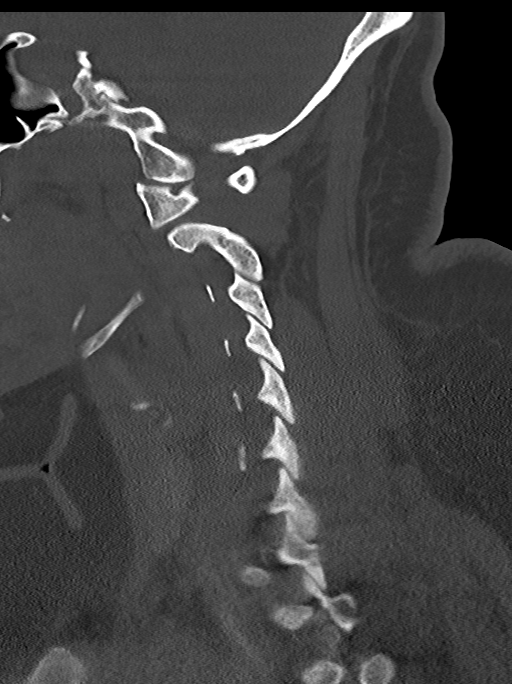
[im 41/61  bone]
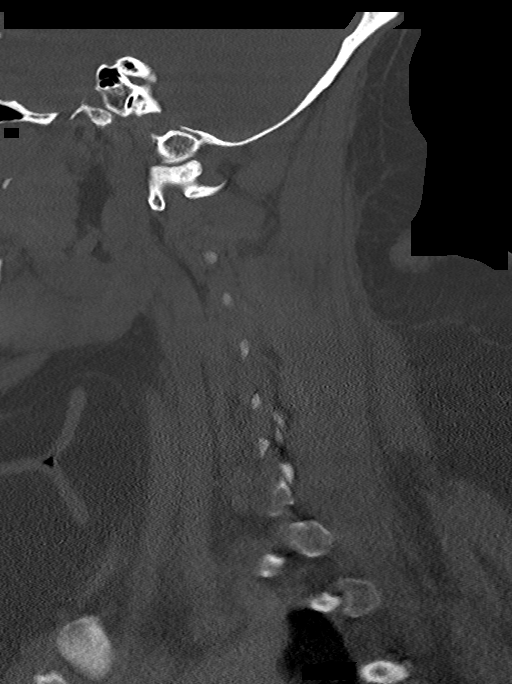

[Series 7: c_spine 2.0 cor bone · coronal · 0.31mm/px · 3 of 61 slices shown]
[im 13/61  bone]
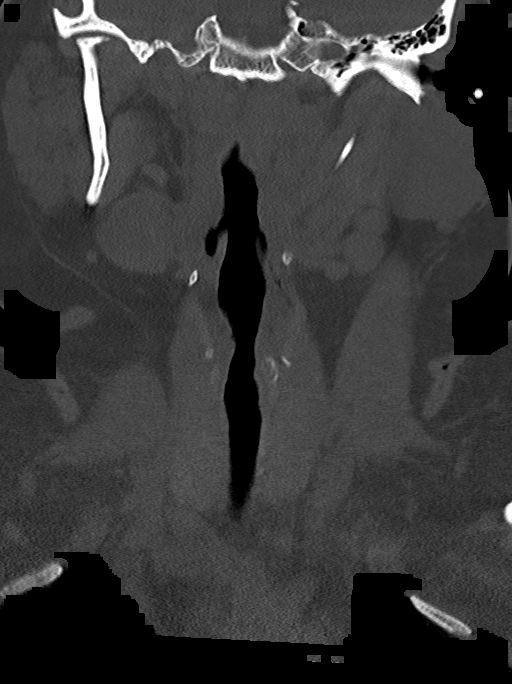
[im 25/61  bone]
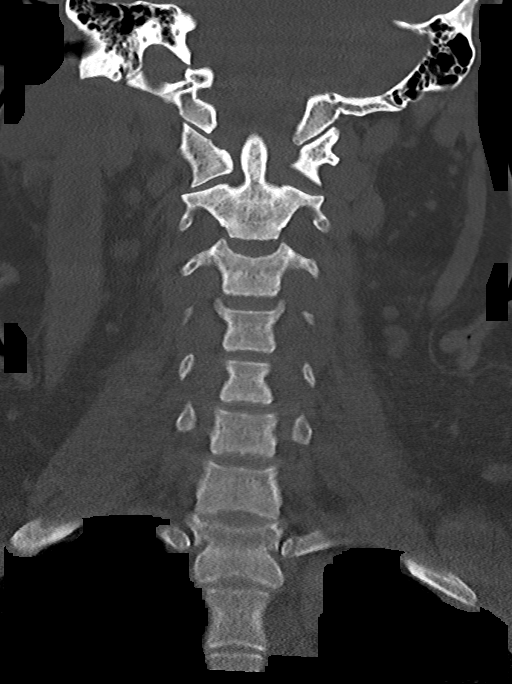
[im 37/61  bone]
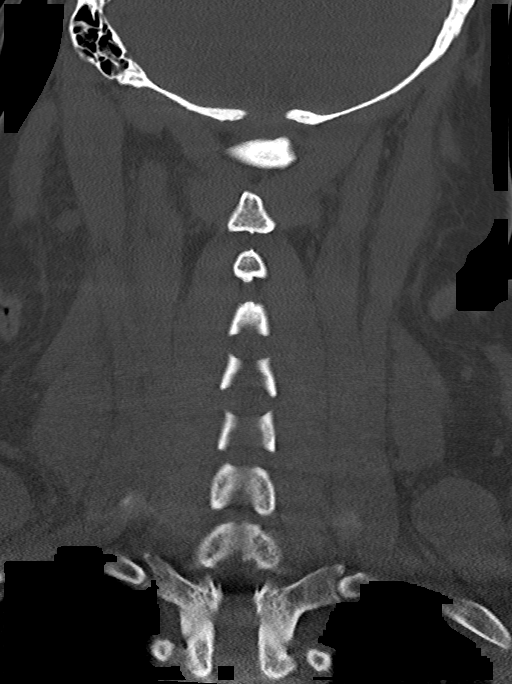

[13 of 33 positions shown; findings below may reference images not displayed]

FINDINGS: Alignment: Straightening of the normal cervical lordosis.

Skull base and vertebrae: Normal

Soft tissues and spinal canal: Abnormal fluid/soft tissue density
region in the posterior subcutaneous fat to the right of midline of
the upper cervical region from about the level of C2 to C5. This
collection extends from the skin surface down in contact the
posterior paraspinal musculature. No evidence of deep
space/intramuscular extension. There appears to be overlying skin
thickening. In a person of this age, most likely diagnosis is soft
tissue abscess. Tumor not excluded but not favored. There are some
reactive regional enlarged lymph nodes.

Disc levels: No suspicion of disc space pathology. Lower cervical
and thoracic region detail is limited because of patient's size.

Upper chest: Negative

Other: None
IMPRESSION: Abnormal fluid/soft tissue density region in the posterior
subcutaneous fat to the right of midline from about the level of C2
to C5. This extends from the skin surface down in contact the
posterior paraspinal musculature. No evidence of deep
space/intramuscular extension. In a person of this age, most likely
diagnosis is soft tissue abscess. Tumor not excluded but not
favored. Some reactive regional nodal prominence.

## 2023-06-01 ENCOUNTER — Other Ambulatory Visit: Payer: Self-pay | Admitting: Pharmacist

## 2023-06-01 ENCOUNTER — Encounter: Payer: Self-pay | Admitting: Internal Medicine

## 2023-06-01 ENCOUNTER — Telehealth: Payer: Self-pay

## 2023-06-01 ENCOUNTER — Ambulatory Visit: Payer: MEDICAID | Attending: Internal Medicine | Admitting: Internal Medicine

## 2023-06-01 DIAGNOSIS — Z794 Long term (current) use of insulin: Secondary | ICD-10-CM

## 2023-06-01 DIAGNOSIS — E1169 Type 2 diabetes mellitus with other specified complication: Secondary | ICD-10-CM

## 2023-06-01 DIAGNOSIS — E119 Type 2 diabetes mellitus without complications: Secondary | ICD-10-CM | POA: Diagnosis not present

## 2023-06-01 DIAGNOSIS — Z7985 Long-term (current) use of injectable non-insulin antidiabetic drugs: Secondary | ICD-10-CM | POA: Diagnosis not present

## 2023-06-01 DIAGNOSIS — R030 Elevated blood-pressure reading, without diagnosis of hypertension: Secondary | ICD-10-CM

## 2023-06-01 DIAGNOSIS — F250 Schizoaffective disorder, bipolar type: Secondary | ICD-10-CM

## 2023-06-01 DIAGNOSIS — Z7984 Long term (current) use of oral hypoglycemic drugs: Secondary | ICD-10-CM

## 2023-06-01 LAB — POCT GLYCOSYLATED HEMOGLOBIN (HGB A1C): HbA1c, POC (controlled diabetic range): 14.8 % — AB (ref 0.0–7.0)

## 2023-06-01 LAB — GLUCOSE, POCT (MANUAL RESULT ENTRY): POC Glucose: 259 mg/dl — AB (ref 70–99)

## 2023-06-01 MED ORDER — INSULIN PEN NEEDLE 31G X 8 MM MISC: 6 refills | Status: DC

## 2023-06-01 MED ORDER — ACCU-CHEK GUIDE VI STRP: ORAL_STRIP | 12 refills | Status: AC

## 2023-06-01 MED ORDER — METFORMIN HCL ER 500 MG PO TB24: 500.0000 mg | ORAL_TABLET | Freq: Every day | ORAL | 1 refills | Status: DC

## 2023-06-01 MED ORDER — ACCU-CHEK AVIVA PLUS VI STRP: ORAL_STRIP | 12 refills | Status: DC

## 2023-06-01 MED ORDER — ACCU-CHEK SOFTCLIX LANCETS MISC: 12 refills | Status: AC

## 2023-06-01 MED ORDER — ACCU-CHEK AVIVA PLUS W/DEVICE KIT: PACK | 0 refills | Status: DC

## 2023-06-01 MED ORDER — TRULICITY 0.75 MG/0.5ML ~~LOC~~ SOAJ: 0.7500 mg | SUBCUTANEOUS | 6 refills | Status: DC

## 2023-06-01 MED ORDER — ACCU-CHEK GUIDE W/DEVICE KIT: PACK | 0 refills | Status: DC

## 2023-06-01 MED ORDER — INSULIN GLARGINE 100 UNIT/ML SOLOSTAR PEN: 60.0000 [IU] | PEN_INJECTOR | Freq: Every day | SUBCUTANEOUS | 6 refills | Status: DC

## 2023-06-01 NOTE — Telephone Encounter (Signed)
Copied from CRM [id number]. Topic: General - Other >> Jun 01, 2023  3:19 PM Ja-Kwan M wrote: Reason for CRM: Marchelle Folks with [employer] stated Accu Chek Aviva is no longer manufactured. Marchelle Folks requests that a new Rx for Accu Chek Guide Me meter and test strips be sent to them.

## 2023-06-01 NOTE — Progress Notes (Signed)
Patient ID: Bridgit Klima Altergott, female    DOB: [birth year]  MRN: [id number]  CC: Diabetes (DM f/u. Med refill - requesting rx for BS meter/Yes to pap for another appt. )   Subjective: Jeniya Anand is a 27 y.o. female who presents for chronic ds management Her concerns today include:  Patient with history of  DM type II, morbid obesity,  bipolar 1/schizophrenia/depression,   DM/obesity:   Results for orders placed or performed in visit on 06/01/23  POCT glucose (manual entry)  Result Value Ref Range   POC Glucose 259 (A) 70 - 99 mg/dl  POCT glycosylated hemoglobin (Hb A1C)  Result Value Ref Range   Hemoglobin A1C     HbA1c POC (<> result, manual entry)     HbA1c, POC (prediabetic range)     HbA1c, POC (controlled diabetic range) 14.8 (A) 0.0 - 7.0 %  Should be on Lantus 60 units daily, Trulicity and Metformin XR 500 mg 2 tabs daily.  Reports taking consistently Lantus 60 units and Metformin.  Metformin causes diarrhea. Took Trulicity 2x and never got RF.  No S.E -Meter no longer works.  Prefers to stick with meter than to get CGM Reports doing better with eating habits - has decreased fried foods, more steam veggies, less noodles/patatoes/mac and cheese Walking daily for 1 hr started 1 wk ago Has appt with [facility] 06/10/2023. No numbness in feet.  Diastolic blood pressure elevated on last visit and elevated again today. Uses a lot of salt in foods  Bipolar1/schizophrenia:  Missed last appt with psychiatry.  Plans to do a walk in at [facility].  Denies any suicidal ideation, auditory or visual hallucinations.  Patient Active Problem List   Diagnosis Date Noted   Schizoaffective disorder, bipolar type (HCC) 08/04/2022   Generalized anxiety disorder 08/04/2022   Insomnia 08/04/2022   Diabetes (HCC) 04/04/2020     Current Outpatient Medications on File Prior to Visit  Medication Sig Dispense Refill   haloperidol decanoate (HALDOL DECANOATE) 100 MG/ML injection Inject 1 mL (100  mg total) into the muscle every 28 (twenty-eight) days. 1 mL 11   hydrOXYzine (ATARAX) 10 MG tablet Take 1 tablet (10 mg total) by mouth 3 (three) times daily as needed. 90 tablet 3   Lancets (ACCU-CHEK SOFT TOUCH) lancets Use as instructed 100 each 12   traZODone (DESYREL) 50 MG tablet Take 1 tablet (50 mg total) by mouth at bedtime as needed for sleep. 30 tablet 3   FLUoxetine (PROZAC) 40 MG capsule Take 1 capsule (40 mg total) by mouth daily. 30 capsule 0   No current facility-administered medications on file prior to visit.    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   Sexual activity: Yes    Birth control/protection: None  Other Topics Concern   Not on file  Social History Narrative   Not on file   Social Determinants of Health   Financial Resource Strain: Not on file  Food Insecurity: No Food Insecurity (08/04/2022)   Hunger Vital Sign    Worried About Running Out of Food in the Last Year: Never true    Ran Out of Food in the Last Year: Never true  Transportation Needs: Patient Declined (08/04/2022)   PRAPARE - Administrator, Civil Service (Medical): Patient declined    Lack of Transportation (Non-Medical): Patient declined  Physical Activity: Not on file  Stress: Not on file  Social Connections: Not on file  Intimate Partner Violence: Patient Declined (08/04/2022)   Humiliation, Afraid,  Rape, and Kick questionnaire    Fear of Current or Ex-Partner: Patient declined    Emotionally Abused: Patient declined    Physically Abused: Patient declined    Sexually Abused: Patient declined    Family History  Problem Relation Age of Onset   Stroke Mother    Diabetes Other    Hypertension Other    Asthma Father    Asthma Brother     Past Surgical History:  Procedure Laterality Date   INCISION AND DRAINAGE ABSCESS Left 12/08/2020   Procedure: INCISION AND DRAINAGE POSTERIOR NECK ABSCESS;  Surgeon: Gaynelle Adu, MD;  Location: [facility] OR;  Service: General;  Laterality: Left;   NO PAST SURGERIES      ROS: Review of Systems Negative except as stated above  PHYSICAL EXAM: BP 119/88   Pulse 70   Ht 5\' 7"  (1.702 m)   Wt 283 lb (128.4 kg)   SpO2 98%   BMI 44.32 kg/m   Wt Readings from Last 3 Encounters:  06/01/23 283 lb (128.4 kg)  09/03/22 (!) 314 lb 3.2 oz (142.5 kg)  07/20/22 (!) 320 lb (145.2 kg)    Physical Exam  General appearance - alert, well appearing, and in no distress Mental status - normal mood, behavior, speech, dress, motor activity, and thought processes Neck - supple, no significant adenopathy Chest - clear to auscultation, no wheezes, rales or rhonchi, symmetric air entry Heart - normal rate, regular rhythm, normal S1, S2, no murmurs, rubs, clicks or gallops Extremities - peripheral pulses normal, no pedal edema, no clubbing or cyanosis Diabetic Foot Exam - Simple   Simple Foot Form Diabetic Foot exam was performed with the following findings: Yes 06/01/2023  2:36 PM  Visual Inspection No deformities, no ulcerations, no other skin breakdown bilaterally: Yes Sensation Testing Intact to touch and monofilament testing bilaterally: Yes Pulse Check Posterior Tibialis and Dorsalis pulse intact bilaterally: Yes Comments        06/01/2023    2:19 PM 09/03/2022    2:47 PM 08/25/2022   11:36 AM  Depression screen PHQ 2/9  Decreased Interest 0 0   Down,  Depressed, Hopeless 0 0   PHQ - 2 Score 0 0   Altered sleeping  1   Tired, decreased energy  1   Change in appetite  1   Feeling bad or failure about yourself   0   Trouble concentrating  0   Moving slowly or fidgety/restless  0   Suicidal thoughts  0   PHQ-9 Score  3   Difficult doing work/chores        Information is confidential and restricted. Go to Review Flowsheets to unlock data.       Latest Ref Rng & Units 11/22/2022  2:30 PM 08/06/2022    6:40 PM 08/02/2022    1:02 PM  CMP  Glucose 70 - 99 mg/dL 643  329  518   BUN 6 - 20 mg/dL 11  14  11    Creatinine 0.44 - 1.00 mg/dL 8.41  6.60  6.30   Sodium 135 - 145 mmol/L 135  138  131   Potassium 3.5 - 5.1 mmol/L 4.1  3.8  3.6   Chloride 98 - 111 mmol/L 102  106  102   CO2 22 - 32 mmol/L 24  27  24    Calcium 8.9 - 10.3 mg/dL 9.0  9.2  8.8   Total Protein 6.5 - 8.1 g/dL 7.6  7.1  7.2   Total Bilirubin 0.3 - 1.2 mg/dL 0.6  0.3  0.5   Alkaline Phos 38 - 126 U/L 52  63  55   AST 15 - 41 U/L 16  15  16    ALT 0 - 44 U/L 19  21  19     Lipid Panel     Component Value Date/Time   CHOL 136 08/06/2022 1840   TRIG 266 (H) 08/06/2022 1840   HDL 23 (L) 08/06/2022 1840   CHOLHDL 5.9 08/06/2022 1840   VLDL 53 (H) 08/06/2022 1840   LDLCALC 60 08/06/2022 1840   LDLDIRECT 74.6 02/11/2022 1325    CBC    Component Value Date/Time   WBC 7.7 11/22/2022 1430   RBC 5.10 11/22/2022 1430   HGB 14.3 11/22/2022 1430   HCT 43.0 11/22/2022 1430   PLT 275 11/22/2022 1430   MCV 84.3 11/22/2022 1430   MCH 28.0 11/22/2022 1430   MCHC 33.3 11/22/2022 1430   RDW 13.3 11/22/2022 1430   LYMPHSABS 4.3 (H) 08/06/2022 1840   MONOABS 0.7 08/06/2022 1840   EOSABS 0.3 08/06/2022 1840   BASOSABS 0.1 08/06/2022 1840    ASSESSMENT AND PLAN:  1. Type 2 diabetes mellitus with morbid obesity (HCC) Not at goal. Discussed the importance of healthy eating habits, regular aerobic exercise (at least 150 minutes a week as tolerated) and medication  compliance to achieve or maintain control of diabetes. Decrease metformin to 500 mg 1 tablet a [place] to see if diarrhea stops. Continue Lantus insulin 60 units daily. Discussed restarting Trulicity.  Went over with her how the medication works and possible side effects.  Side effects discussed includes nausea, vomiting, diarrhea/constipation, pancreatitis, bowel blockage, palpitations.  Advised to stop the medicine if she develops any vomiting or pain in the abdomen or severe diarrhea.  She is willing to get back on the medicine. - POCT glucose (manual entry) - POCT glycosylated hemoglobin (Hb A1C) - insulin glargine (LANTUS) 100 UNIT/ML Solostar Pen; Inject 60 Units into the skin daily.  Dispense: 15 mL; Refill: 6 - Insulin Pen Needle 31G X 8 MM MISC; Use as directed  Dispense: 100 each; Refill: 6 - Comprehensive metabolic panel - Dulaglutide (TRULICITY) 0.75 MG/0.5ML SOPN; Inject 0.75 mg into the skin once a week.  Dispense: 2 mL; Refill: 6 - metFORMIN (GLUCOPHAGE-XR) 500 MG 24 hr tablet; Take 1 tablet (500 mg total) by mouth daily with breakfast.  Dispense: 90 tablet; Refill: 1 - glucose blood (ACCU-CHEK AVIVA PLUS) test strip; Check BS 3x/[place] with meals  Dispense: 100 each; Refill: 12 - Blood Glucose Monitoring Suppl (ACCU-CHEK AVIVA PLUS) w/Device KIT; Check Blood sugars 3x/[place]cker with meals.  Dispense: 1 kit; Refill: 0 - Accu-Chek Softclix Lancets lancets; Check BS 3x/[place] with meals  Dispense: 100 each;  Refill: 12  2. Insulin long-term use (HCC) See #1 above. - glucose blood (ACCU-CHEK AVIVA PLUS) test strip; Check BS 3x/[place] with meals  Dispense: 100 each; Refill: 12 - Blood Glucose Monitoring Suppl (ACCU-CHEK AVIVA PLUS) w/Device KIT; Check Blood sugars 3x/Brandenberger with meals.  Dispense: 1 kit; Refill: 0 - Accu-Chek Softclix Lancets lancets; Check BS 3x/[place] with meals  Dispense: 100 each; Refill: 12  3. Long-term (current) use of injectable non-insulin antidiabetic drugs See #1 above.  4.  Diabetes mellitus treated with oral medication (HCC) See #1 above.  5. Elevated blood pressure reading in office without diagnosis of hypertension DASH diet discussed and encouraged.  6. Schizoaffective disorder, bipolar type [facility]) Patient plans to do a walk-in at [facility] behavioral health to get back on her medications.  I have encouraged her to do so as soon as possible.    Patient was given the opportunity to ask questions.  Patient verbalized understanding of the plan and was able to repeat key elements of the plan.   This documentation was completed using Paediatric nurse.  Any transcriptional errors are unintentional.  Orders Placed This Encounter  Procedures   Comprehensive metabolic panel   POCT glucose (manual entry)   POCT glycosylated hemoglobin (Hb A1C)     Requested Prescriptions   Signed Prescriptions Disp Refills   insulin glargine (LANTUS) 100 UNIT/ML Solostar Pen 15 mL 6    Sig: Inject 60 Units into the skin daily.   Dulaglutide (TRULICITY) 0.75 MG/0.5ML SOPN 2 mL 6    Sig: Inject 0.75 mg into the skin once a week.   metFORMIN (GLUCOPHAGE-XR) 500 MG 24 hr tablet 90 tablet 1    Sig: Take 1 tablet (500 mg total) by mouth daily with breakfast.   glucose blood (ACCU-CHEK AVIVA PLUS) test strip 100 each 12    Sig: Check BS 3x/[place] with meals   Blood Glucose Monitoring Suppl (ACCU-CHEK AVIVA PLUS) w/Device KIT 1 kit 0    Sig: Check Blood sugars 3x/[place] with meals.   Accu-Chek Softclix Lancets lancets 100 each 12    Sig: Check BS 3x/[place] with meals    Return in about 6 weeks (around 07/13/2023) for PAP/Re-check DM.  Jonah Blue, MD, FACP

## 2023-06-01 NOTE — Addendum Note (Signed)
Addended by: Jonah Blue B on: 06/01/2023 06:32 PM   Modules accepted: Orders

## 2023-06-01 NOTE — Telephone Encounter (Signed)
[place], Technician at [employer] called and says the insulin pen needles sent in today by Dr. Laural Benes needs a frequency per the patient's insurance. Advised I will send this back to her so a new Rx will be sent.   [facility] [address], [place] - [address] Phone: [phone]  Fax: [phone]

## 2023-06-01 NOTE — Telephone Encounter (Signed)
Rxn sent with frequency defined.

## 2023-06-10 LAB — HM DIABETES EYE EXAM

## 2023-07-13 ENCOUNTER — Ambulatory Visit: Payer: MEDICAID | Admitting: Internal Medicine

## 2023-08-05 ENCOUNTER — Telehealth: Payer: Self-pay

## 2023-08-05 ENCOUNTER — Other Ambulatory Visit: Payer: Self-pay

## 2023-08-05 NOTE — Telephone Encounter (Signed)
Pharmacy Patient Advocate Encounter  Received notification from [facility] that Prior Authorization for TRULICITY has been APPROVED from 08/04/2023 to 08/03/2024   PA #/Case ID/Reference #: [id number]

## 2023-08-10 ENCOUNTER — Other Ambulatory Visit: Payer: Self-pay | Admitting: Internal Medicine

## 2023-08-10 DIAGNOSIS — E1169 Type 2 diabetes mellitus with other specified complication: Secondary | ICD-10-CM

## 2023-08-10 NOTE — Telephone Encounter (Unsigned)
Copied from CRM [id number]. Topic: General - Other >> Aug 10, 2023  4:27 PM Everette C wrote: Reason for CRM: Medication Refill - Medication: Dulaglutide (TRULICITY) 0.75 MG/0.5ML SOPN [[id number]]  Has the patient contacted their pharmacy? Yes.   (Agent: If no, request that the patient contact the pharmacy for the refill. If patient does not wish to contact the pharmacy document the reason why and proceed with request.) (Agent: If yes, when and what did the pharmacy advise?)  Preferred Pharmacy (with phone number or street name): [facility] [address] [place] - [address] [place] [address] Phone: [phone] Fax: [phone] Hours: Not open 24 hours   Has the patient been seen for an appointment in the last year OR does the patient have an upcoming appointment? Yes.    Agent: Please be advised that RX refills may take up to 3 business days. We ask that you follow-up with your pharmacy.

## 2023-08-11 MED ORDER — TRULICITY 0.75 MG/0.5ML ~~LOC~~ SOAJ: 0.7500 mg | SUBCUTANEOUS | 1 refills | Status: DC

## 2023-08-11 NOTE — Telephone Encounter (Signed)
Requested Prescriptions  Pending Prescriptions Disp Refills   Dulaglutide (TRULICITY) 0.75 MG/0.5ML SOPN 2 mL 1    Sig: Inject 0.75 mg into the skin once a week.     Endocrinology:  Diabetes - GLP-1 Receptor Agonists Failed - 08/10/2023  5:39 PM      Failed - HBA1C is between 0 and 7.9 and within 180 days    HbA1c, POC (controlled diabetic range)  Date Value Ref Range Status  06/01/2023 14.8 (A) 0.0 - 7.0 % Final         Passed - Valid encounter within last 6 months    Recent Outpatient Visits           2 months ago Type 2 diabetes mellitus with morbid obesity [facility])   [place] [facility] Jonah Blue B, MD   11 months ago Type 2 diabetes mellitus with morbid obesity [facility])   [place] [facility] Marcine Matar, MD   1 year ago No-show for appointment   [facility] Marcine Matar, MD   2 years ago Encounter for medication review   [facility] [place], [place] L, RPH-CPP   2 years ago Type 2 diabetes mellitus with morbid obesity [facility])   [place] [facility] Marcine Matar, MD       Future Appointments             In 4 weeks Marcine Matar, MD [facility]

## 2023-09-02 ENCOUNTER — Telehealth: Payer: Self-pay | Admitting: Internal Medicine

## 2023-09-02 DIAGNOSIS — E1169 Type 2 diabetes mellitus with other specified complication: Secondary | ICD-10-CM

## 2023-09-02 NOTE — Telephone Encounter (Signed)
Medication Refill - Medication: Dulaglutide (TRULICITY) 0.75 MG/0.5ML SOPN   Also requesting more needles Insulin Pen Needle 31G X 8 MM MISC   Has the patient contacted their pharmacy? Yes.   (Agent: If no, request that the patient contact the pharmacy for the refill. If patient does not wish to contact the pharmacy document the reason why and proceed with request.) (Agent: If yes, when and what did the pharmacy advise?)  Preferred Pharmacy (with phone number or street name):  [facility] [address], [place] - [address]  [address] [place] [address]  Phone: [phone] Fax: [phone]   Has the patient been seen for an appointment in the last year OR does the patient have an upcoming appointment? Yes.    Agent: Please be advised that RX refills may take up to 3 business days. We ask that you follow-up with your pharmacy.

## 2023-09-02 NOTE — Telephone Encounter (Signed)
[facility] called and spoke to [employer], Pensions consultant about the refill(s) trulicity requested. Advised it was sent on 08/11/23 #2 ml/1 refill(s), insulin pen needle sent on 06/01/23 #100/6 refills. She says they can get both of those ready for pickup today.

## 2023-09-09 ENCOUNTER — Ambulatory Visit (HOSPITAL_BASED_OUTPATIENT_CLINIC_OR_DEPARTMENT_OTHER): Payer: MEDICAID | Admitting: Internal Medicine

## 2023-09-15 NOTE — Progress Notes (Signed)
Opened in error

## 2023-09-28 ENCOUNTER — Ambulatory Visit: Payer: MEDICAID | Attending: Internal Medicine | Admitting: Internal Medicine

## 2023-09-28 ENCOUNTER — Encounter: Payer: Self-pay | Admitting: Internal Medicine

## 2023-09-28 DIAGNOSIS — E1169 Type 2 diabetes mellitus with other specified complication: Secondary | ICD-10-CM

## 2023-09-28 DIAGNOSIS — Z7985 Long-term (current) use of injectable non-insulin antidiabetic drugs: Secondary | ICD-10-CM

## 2023-09-28 DIAGNOSIS — Z7984 Long term (current) use of oral hypoglycemic drugs: Secondary | ICD-10-CM

## 2023-09-28 DIAGNOSIS — E119 Type 2 diabetes mellitus without complications: Secondary | ICD-10-CM

## 2023-09-28 DIAGNOSIS — Z794 Long term (current) use of insulin: Secondary | ICD-10-CM

## 2023-09-28 DIAGNOSIS — F250 Schizoaffective disorder, bipolar type: Secondary | ICD-10-CM | POA: Diagnosis not present

## 2023-09-28 DIAGNOSIS — I152 Hypertension secondary to endocrine disorders: Secondary | ICD-10-CM | POA: Diagnosis not present

## 2023-09-28 DIAGNOSIS — E1159 Type 2 diabetes mellitus with other circulatory complications: Secondary | ICD-10-CM | POA: Diagnosis not present

## 2023-09-28 DIAGNOSIS — Z2821 Immunization not carried out because of patient refusal: Secondary | ICD-10-CM

## 2023-09-28 LAB — POCT GLYCOSYLATED HEMOGLOBIN (HGB A1C): HbA1c, POC (controlled diabetic range): 7.8 % — AB (ref 0.0–7.0)

## 2023-09-28 LAB — GLUCOSE, POCT (MANUAL RESULT ENTRY): POC Glucose: 141 mg/dL — AB (ref 70–99)

## 2023-09-28 MED ORDER — METFORMIN HCL ER 500 MG PO TB24: 500.0000 mg | ORAL_TABLET | Freq: Every day | ORAL | 1 refills | Status: DC

## 2023-09-28 MED ORDER — TRULICITY 1.5 MG/0.5ML ~~LOC~~ SOAJ: 1.5000 mg | SUBCUTANEOUS | 3 refills | Status: DC

## 2023-09-28 MED ORDER — VALSARTAN 40 MG PO TABS: 40.0000 mg | ORAL_TABLET | Freq: Every day | ORAL | 3 refills | Status: AC

## 2023-09-28 NOTE — Patient Instructions (Signed)
Increase Trulicity to 1.5 mg once a week. Decrease your insulin to 58 units daily. We have started a new blood pressure medication called valsartan 40 mg daily.

## 2023-09-28 NOTE — Progress Notes (Signed)
Patient ID: Cindy Leonard, female    DOB: 1996/04/13  MRN: [id number]  CC: Diabetes (DM f/u. Med refill. Declined pap./Concern about unintentional weight gain - walks to work daily & walking during working hours, reports not eating much/Reports increased irritability / bipolar episodes - requesting meds to help with emotions  /No to flu vax. )   Subjective: Cindy Leonard is a 27 y.o. female who presents for chronic ds management. Her concerns today include:  Patient with history of  DM type II, morbid obesity,  bipolar 1/schizophrenia/depression,    Discussed the use of AI scribe software for clinical note transcription with the patient, who gave verbal consent to proceed.  History of Present Illness   The patient, with a history of diabetes and bipolar disorder, presents for a follow-up visit.   .DM: Results for orders placed or performed in visit on 09/28/23  POCT glycosylated hemoglobin (Hb A1C)  Result Value Ref Range   Hemoglobin A1C     HbA1c POC (<> result, manual entry)     HbA1c, POC (prediabetic range)     HbA1c, POC (controlled diabetic range) 7.8 (A) 0.0 - 7.0 %  POCT glucose (manual entry)  Result Value Ref Range   POC Glucose 141 (A) 70 - 99 mg/dl   She reports issues with obtaining her prescribed Trulicity (0.75mg  once a week) from her pharmacy, leading to a two-week gap in medication. However, she has since resumed the medication. She reports feeling fuller faster since starting Trulicity, but denies any adverse effects such as vomiting, abdominal pain, diarrhea, or severe constipation.  Her A1c has significantly improved from 14.8 to 7.8 since her last visit. She also reports running out of Metformin (500mg  once a Reierson) since her last appointment in July and has not been able to refill it. She has been taking 66 units of insulin daily, an increase from the prescribed 60 units. She checks her blood sugars three times a Reust, reporting readings between 140 and  160.  The patient has gained 26 pounds since her last visit, which she finds puzzling given her daily walking routine and active job. She reports significant stress and has been eating less. She denies consuming high-salt foods and has been focusing on healthier options like baked salmon, broccoli, and okra.  We have been monitoring her blood pressure.  It was elevated on last visit.  DASH diet discussed and encouraged.  She admits that she still snack on chips at times.  In terms of her mental health, the patient reports increasing frustration and agitation, but denies any harmful thoughts. She has not received her Haldol injection since last year due to difficulties with her care management team and transportation issues. She expresses a desire to get back on her medication.  She is plugged into [facility].  Her caseworker is Wilhelmenia Blase ([phone]    Patient Active Problem List   Diagnosis Date Noted   Schizoaffective disorder, bipolar type (HCC) 08/04/2022   Generalized anxiety disorder 08/04/2022   Insomnia 08/04/2022   Diabetes (HCC) 04/04/2020     Current Outpatient Medications on File Prior to Visit  Medication Sig Dispense Refill   Accu-Chek Softclix Lancets lancets Check BS 3x/Soeder with meals 100 each 12   Accu-Chek Softclix Lancets lancets Check blood sugars 3 times a Riechers before meals. 100 each 12   Blood Glucose Monitoring Suppl (ACCU-CHEK GUIDE) w/Device KIT Used to check blood sugars 3 times a Raska 1 kit  0   Dulaglutide (TRULICITY) 0.75 MG/0.5ML SOPN Inject 0.75 mg into the skin once a week. 2 mL 1   glucose blood (ACCU-CHEK GUIDE) test strip Check blood sugars 3 times a Men before meals. 100 each 12   hydrOXYzine (ATARAX) 10 MG tablet Take 1 tablet (10 mg total) by mouth 3 (three) times daily as needed. 90 tablet 3   insulin glargine (LANTUS) 100 UNIT/ML Solostar Pen Inject 60 Units into the skin daily. 15 mL 6   Insulin Pen Needle 31G X 8  MM MISC Use to inject insulin once daily. 100 each 6   Lancets (ACCU-CHEK SOFT TOUCH) lancets Use as instructed 100 each 12   metFORMIN (GLUCOPHAGE-XR) 500 MG 24 hr tablet Take 1 tablet (500 mg total) by mouth daily with breakfast. 90 tablet 1   traZODone (DESYREL) 50 MG tablet Take 1 tablet (50 mg total) by mouth at bedtime as needed for sleep. 30 tablet 3   FLUoxetine (PROZAC) 40 MG capsule Take 1 capsule (40 mg total) by mouth daily. 30 capsule 0   haloperidol decanoate (HALDOL DECANOATE) 100 MG/ML injection Inject 1 mL (100 mg total) into the muscle every 28 (twenty-eight) days. (Patient not taking: Reported on 09/28/2023) 1 mL 11   No current facility-administered medications on file prior to visit.    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   Sexual activity: Yes    Birth control/protection: None  Other Topics Concern   Not on file  Social History Narrative   Not on file   Social Determinants of Health   Financial Resource Strain: Low Risk  (09/09/2023)   Overall Financial Resource Strain (CARDIA)    Difficulty of Paying Living Expenses: Not hard at all  Food Insecurity: No Food Insecurity (09/09/2023)   Hunger Vital Sign    Worried About Running Out of Food in the Last Year: Never true    Ran Out of Food in the Last Year: Never true  Transportation Needs: No Transportation Needs (09/09/2023)   PRAPARE  - Administrator, Civil Service (Medical): No    Lack of Transportation (Non-Medical): No  Physical Activity: Inactive (09/09/2023)   Exercise Vital Sign    Days of Exercise per Week: 0 days    Minutes of Exercise per Session: 0 min  Stress: No Stress Concern Present (09/09/2023)   [employer] of Occupational Health - Occupational Stress Questionnaire    Feeling of Stress : Not at all  Social Connections: Socially Isolated (09/09/2023)   Social Connection and Isolation Panel [NHANES]    Frequency of Communication with Friends and Family: Once a week    Frequency of Social Gatherings with Friends and Family: Never    Attends Religious Services: Never    Database administrator or  Organizations: No    Attends Banker Meetings: Never    Marital Status: Never married  Intimate Partner Violence: Not At Risk (09/09/2023)   Humiliation, Afraid, Rape, and Kick questionnaire    Fear of Current or Ex-Partner: No    Emotionally Abused: No    Physically Abused: No    Sexually Abused: No    Family History  Problem Relation Age of Onset   Stroke Mother    Diabetes Other    Hypertension Other    Asthma Father    Asthma Brother     Past Surgical History:  Procedure Laterality Date   INCISION AND DRAINAGE ABSCESS Left 12/08/2020   Procedure: INCISION AND DRAINAGE POSTERIOR NECK ABSCESS;  Surgeon: Gaynelle Adu, MD;  [facility] OR;  Service: General;  Laterality: Left;   NO PAST SURGERIES      ROS: Review of Systems Negative except as stated above  PHYSICAL EXAM: BP 135/86 (BP Location: Left Arm, Patient Position: Sitting, Cuff Size: Large)   Pulse 85   Temp 98.4 F (36.9 C) (Oral)   Ht 5\' 7"  (1.702 m)   Wt (!) 309 lb (140.2 kg)   LMP 08/31/2023 (Exact Date)   SpO2 98%   BMI 48.40 kg/m   Wt Readings from Last 3 Encounters:  09/28/23 (!) 309 lb (140.2 kg)  06/01/23 283 lb (128.4 kg)  09/03/22 (!) 314 lb 3.2 oz (142.5 kg)    Physical Exam  General  appearance - alert, well appearing, and in no distress Mental status - normal mood, behavior, speech, dress, motor activity, and thought processes Chest - clear to auscultation, no wheezes, rales or rhonchi, symmetric air entry Heart - normal rate, regular rhythm, normal S1, S2, no murmurs, rubs, clicks or gallops Extremities - peripheral pulses normal, no pedal edema, no clubbing or cyanosis      Latest Ref Rng & Units 06/01/2023    2:53 PM 11/22/2022    2:30 PM 08/06/2022    6:40 PM  CMP  Glucose 70 - 99 mg/dL 409  811  914   BUN 6 - 20 mg/dL 11  11  14    Creatinine 0.57 - 1.00 mg/dL 7.82  9.56  2.13   Sodium 134 - 144 mmol/L 140  135  138   Potassium 3.5 - 5.2 mmol/L 4.5  4.1  3.8   Chloride 96 - 106 mmol/L 102  102  106   CO2 20 - 29 mmol/L 22  24  27    Calcium 8.7 - 10.2 mg/dL 9.8  9.0  9.2   Total Protein 6.0 - 8.5 g/dL 7.0  7.6  7.1   Total Bilirubin 0.0 - 1.2 mg/dL <0.8  0.6  0.3   Alkaline Phos 44 - 121 IU/L 76  52  63   AST 0 - 40 IU/L 13  16  15    ALT 0 - 32 IU/L 19  19  21     Lipid Panel     Component Value Date/Time   CHOL 136 08/06/2022 1840   TRIG 266 (H) 08/06/2022 1840   HDL 23 (L) 08/06/2022 1840   CHOLHDL 5.9 08/06/2022 1840   VLDL 53 (H) 08/06/2022 1840   LDLCALC 60 08/06/2022 1840   LDLDIRECT 74.6 02/11/2022 1325    CBC    Component Value Date/Time   WBC 7.7 11/22/2022 1430   RBC 5.10 11/22/2022 1430   HGB 14.3 11/22/2022 1430   HCT 43.0 11/22/2022 1430   PLT 275 11/22/2022 1430  MCV 84.3 11/22/2022 1430   MCH 28.0 11/22/2022 1430   MCHC 33.3 11/22/2022 1430   RDW 13.3 11/22/2022 1430   LYMPHSABS 4.3 (H) 08/06/2022 1840   MONOABS 0.7 08/06/2022 1840   EOSABS 0.3 08/06/2022 1840   BASOSABS 0.1 08/06/2022 1840    ASSESSMENT AND PLAN: 1. Type 2 diabetes mellitus with morbid obesity (HCC) A1c is significantly improved since last visit in July.  However weight has increased.  She is tolerating Trulicity 0.75 mg.  We discussed increasing the  dose to 1.5 mg once a week.  Went over potential side effects of the medication with her again including nausea, vomiting, pancreatitis, bowel blockage.  Advised to stop the medicine and be seen if she develops any vomiting, pain in the upper abdomen, severe diarrhea or constipation. -Encouraged her to continue healthy eating habits and regular exercise -Decrease insulin to 58 units - POCT glycosylated hemoglobin (Hb A1C) - POCT glucose (manual entry) - metFORMIN (GLUCOPHAGE-XR) 500 MG 24 hr tablet; Take 1 tablet (500 mg total) by mouth daily with breakfast.  Dispense: 90 tablet; Refill: 1 - Dulaglutide (TRULICITY) 1.5 MG/0.5ML SOAJ; Inject 1.5 mg into the skin once a week.  Dispense: 2 mL; Refill: 3 - Microalbumin / creatinine urine ratio  2. Diabetes mellitus treated with oral medication (HCC) See #1 above  3. Insulin long-term use (HCC) See #1 above  4. Long-term (current) use of injectable non-insulin antidiabetic drugs See #1 above  5. Hypertension associated with type 2 diabetes mellitus (HCC) Repeat blood pressure today still above goal. DASH diet encouraged.  Advised to eat healthier snacks instead of chips. Start Diovan 40 mg daily.  Advised to stop the medication if and when she decides to become pregnant.  Patient tells me that pregnancy is not in the future at this time.  She is in a same-sex relationship. - valsartan (DIOVAN) 40 MG tablet; Take 1 tablet (40 mg total) by mouth daily.  Dispense: 90 tablet; Refill: 3  6. Schizoaffective disorder, bipolar type (HCC) I offered to refer her to behavioral health.  However patient wants to hold off on that stating that she will get back with her caseworker through [facility] for her to get her plugged in  7. Influenza vaccination declined    Patient was given the opportunity to ask questions.  Patient verbalized understanding of the plan and was able to repeat key elements of the plan.   This documentation was  completed using Paediatric nurse.  Any transcriptional errors are unintentional.  Orders Placed This Encounter  Procedures   POCT glycosylated hemoglobin (Hb A1C)   POCT glucose (manual entry)     Requested Prescriptions   Pending Prescriptions Disp Refills   Dulaglutide (TRULICITY) 0.75 MG/0.5ML SOAJ 2 mL 1    Sig: Inject 0.75 mg into the skin once a week.   metFORMIN (GLUCOPHAGE-XR) 500 MG 24 hr tablet 90 tablet 1    Sig: Take 1 tablet (500 mg total) by mouth daily with breakfast.    No follow-ups on file.  Jonah Blue, MD, FACP

## 2023-11-01 ENCOUNTER — Other Ambulatory Visit: Payer: Self-pay

## 2023-11-26 ENCOUNTER — Other Ambulatory Visit: Payer: Self-pay | Admitting: Internal Medicine

## 2023-11-26 DIAGNOSIS — E1169 Type 2 diabetes mellitus with other specified complication: Secondary | ICD-10-CM

## 2023-11-26 MED ORDER — INSULIN PEN NEEDLE 31G X 8 MM MISC: 6 refills | Status: AC

## 2023-11-26 NOTE — Telephone Encounter (Signed)
Copied from CRM [id number]. Topic: Clinical - Medication Refill >> Nov 26, 2023 12:18 PM Patsy Lager T wrote: Most Recent Primary Care Visit:  Provider: Jonah Blue B  Department: [facility]  Visit Type: OFFICE VISIT  Date: 09/28/2023  Medication:  Insulin Pen Needle 31G X 8 MM MISC    Has the patient contacted their pharmacy? No  Is this the correct pharmacy for this prescription? Yes  This is the patient's preferred pharmacy:  [facility] [address] [place] - [address] [place] [address] Phone: [phone] Fax: [phone]  Has the prescription been filled recently? Yes  Is the patient out of the medication? Yes  Has the patient been seen for an appointment in the last year OR does the patient have an upcoming appointment? Yes  Can we respond through MyChart? Yes  Agent: Please be advised that Rx refills may take up to 3 business days. We ask that you follow-up with your pharmacy.

## 2023-12-03 ENCOUNTER — Other Ambulatory Visit: Payer: Self-pay

## 2023-12-03 ENCOUNTER — Ambulatory Visit: Payer: MEDICAID | Attending: Internal Medicine | Admitting: Internal Medicine

## 2023-12-03 ENCOUNTER — Encounter: Payer: Self-pay | Admitting: Internal Medicine

## 2023-12-03 ENCOUNTER — Telehealth: Payer: Self-pay

## 2023-12-03 DIAGNOSIS — E1159 Type 2 diabetes mellitus with other circulatory complications: Secondary | ICD-10-CM | POA: Diagnosis not present

## 2023-12-03 DIAGNOSIS — E66813 Obesity, class 3: Secondary | ICD-10-CM

## 2023-12-03 DIAGNOSIS — Z794 Long term (current) use of insulin: Secondary | ICD-10-CM

## 2023-12-03 DIAGNOSIS — I152 Hypertension secondary to endocrine disorders: Secondary | ICD-10-CM

## 2023-12-03 DIAGNOSIS — E1169 Type 2 diabetes mellitus with other specified complication: Secondary | ICD-10-CM

## 2023-12-03 DIAGNOSIS — Z6841 Body Mass Index (BMI) 40.0 and over, adult: Secondary | ICD-10-CM

## 2023-12-03 DIAGNOSIS — Z7984 Long term (current) use of oral hypoglycemic drugs: Secondary | ICD-10-CM

## 2023-12-03 DIAGNOSIS — Z7985 Long-term (current) use of injectable non-insulin antidiabetic drugs: Secondary | ICD-10-CM

## 2023-12-03 DIAGNOSIS — Z2821 Immunization not carried out because of patient refusal: Secondary | ICD-10-CM

## 2023-12-03 MED ORDER — OZEMPIC (0.25 OR 0.5 MG/DOSE) 2 MG/3ML ~~LOC~~ SOPN: 0.5000 mg | PEN_INJECTOR | SUBCUTANEOUS | 1 refills | Status: DC

## 2023-12-03 NOTE — Telephone Encounter (Signed)
Pharmacy Patient Advocate Encounter   Received notification from CoverMyMeds that prior authorization for [facility] is required/requested.   Insurance verification completed.   The patient is insured through [facility] .   Per test claim: PA required; PA submitted to above mentioned insurance via CoverMyMeds Key/confirmation #/EOC [id number] Status is pending

## 2023-12-03 NOTE — Patient Instructions (Addendum)
Stop Trulicity. Start Ozempic 0.5 mg once a week.  After you have been on the medication for 1 month, if you are tolerating it, please let me know so that we can increase the dose to 1 mg.  Please stop the Ozempic and be seen if you develop any vomiting, abdominal pain, severe diarrhea/constipation.  Semaglutide Injection (Ozempic) What is this medication? SEMAGLUTIDE (SEM a GLOO tide) treats type 2 diabetes. It works by increasing insulin levels in your body, which decreases your blood sugar (glucose). It also reduces the amount of sugar released into your blood and slows down your digestion. It may also be used to lower the risk of heart attack and stroke in people with type 2 diabetes. Changes to diet and exercise are often combined with this medication. This medicine may be used for other purposes; ask your health care provider or pharmacist if you have questions. COMMON BRAND NAME(S): OZEMPIC What should I tell my care team before I take this medication? They need to know if you have any of these conditions: Eye disease caused by diabetes Gallbladder disease Have or have had pancreatitis Having surgery Kidney disease Personal or family history of MEN 2, a condition that causes endocrine gland tumors Personal or family history of thyroid cancer Stomach or intestine problems, such as problems digesting food An unusual or allergic reaction to semaglutide, other medications, foods, dyes, or preservatives Pregnant or trying to get pregnant Breastfeeding How should I use this medication? This medication is for injection under the skin of your upper leg (thigh), stomach area, or upper arm. It is given once every week (every 7 days). You will be taught how to prepare and give this medication. Use exactly as directed. Take your medication at regular intervals. Do not take it more often than directed. If you use this medication with insulin, you should inject this medication and the insulin  separately. Do not mix them together. Do not give the injections right next to each other. Change (rotate) injection sites with each injection. It is important that you put your used needles and syringes in a special sharps container. Do not put them in a trash can. If you do not have a sharps container, call your pharmacist or care team to get one. A special MedGuide will be given to you by the pharmacist with each prescription and refill. Be sure to read this information carefully each time. This medication comes with INSTRUCTIONS FOR USE. Ask your pharmacist for directions on how to use this medication. Read the information carefully. Talk to your pharmacist or care team if you have questions. Talk to your care team about the use of this medication in children. Special care may be needed. Overdosage: If you think you have taken too much of this medicine contact a poison control center or emergency room at once. NOTE: This medicine is only for you. Do not share this medicine with others. What if I miss a dose? If you miss a dose, take it as soon as you can within 5 days after the missed dose. Then take your next dose at your regular weekly time. If it has been longer than 5 days after the missed dose, do not take the missed dose. Take the next dose at your regular time. Do not take double or extra doses. If you have questions about a missed dose, contact your care team for advice. What may interact with this medication? Some medications may affect your blood sugar levels or hide the  symptoms of low blood sugar (hypoglycemia). Talk with your care team about all the medications you take. They may suggest changes to your insulin dose or checking your blood sugar levels more often. Medications that may affect your blood sugar levels include: Alcohol Certain antibiotics, such as ciprofloxacin, levofloxacin, sulfamethoxazole; trimethoprim Certain medications for blood pressure or heart disease, such as  benazepril, enalapril, lisinopril, losartan, valsartan Certain medications for mental health conditions, such as fluoxetine or olanzapine Diuretics, such as hydrochlorothiazide (HCTZ) Estrogen and progestin hormones Other medications for diabetes Steroid medications, such as prednisone or cortisone Testosterone Thyroid hormones Medications that may mask symptoms of low blood sugar include: Beta blockers, such as atenolol, metoprolol, propranolol Clonidine Guanethidine Reserpine This list may not describe all possible interactions. Give your health care provider a list of all the medicines, herbs, non-prescription drugs, or dietary supplements you use. Also tell them if you smoke, drink alcohol, or use illegal drugs. Some items may interact with your medicine. What should I watch for while using this medication? Visit your care team for regular checks on your progress. Tell your care team if your symptoms do not start to get better or if they get worse. You may need blood work done while you are taking this medication. Your care team will monitor your HbA1C (A1C). This test shows what your average blood sugar (glucose) level was over the past 2 to 3 months. Know the symptoms of low blood sugar and know how to treat it. Always carry a source of quick sugar with you. Examples include hard sugar candy or glucose tablets. Make sure others know that you can choke if you eat or drink if your blood sugar is too low and you are unable to care for yourself. Get medical help at once. Tell your care team if you have high blood sugar. Your medication dose may change if your body is under stress. Some types of stress that may affect your blood sugar include fever, infection, and surgery. Do not share pens or cartridges with anyone, even if the needle is changed. Each pen should only be used by one person. Sharing could cause an infection. Wear a medical ID bracelet or chain. Carry a card that describes your  condition. List the medications and doses you take on the card. Talk to your care team about your risk of cancer. You may be more at risk for certain types of cancer if you take this medication. Talk to your care team right away if you have a lump or swelling in your neck, hoarseness that does not go away, trouble swallowing, shortness of breath, or trouble breathing. Make sure you stay hydrated while taking this medication. Drink water often. Eat fruits and veggies that have a high water content. Drink more water when it is hot or you are active. Talk to your care team right away if you have fever, infection, vomiting, diarrhea, or if you sweat a lot while taking this medication. The loss of too much body fluid may make it dangerous for you to take this medication. If you are going to need surgery or a procedure, tell your care team that you are taking this medication. What side effects may I notice from receiving this medication? Side effects that you should report to your care team as soon as possible: Allergic reactions--skin rash, itching, hives, swelling of the face, lips, tongue, or throat Change in vision Dehydration--increased thirst, dry mouth, feeling faint or lightheaded, headache, dark yellow or brown urine Gallbladder  problems--severe stomach pain, nausea, vomiting, fever Heart palpitations--rapid, pounding, or irregular heartbeat Kidney injury--decrease in the amount of urine, swelling of the ankles, hands, or feet Pancreatitis--severe stomach pain that spreads to your back or gets worse after eating or when touched, fever, nausea, vomiting Thoughts of suicide or self-harm, worsening mood, feelings of depression Thyroid cancer--new mass or lump in the neck, pain or trouble swallowing, trouble breathing, hoarseness Side effects that usually do not require medical attention (report these to your care team if they continue or are bothersome): Diarrhea Loss of appetite Nausea Upset  stomach This list may not describe all possible side effects. Call your doctor for medical advice about side effects. You may report side effects to FDA at 1-800-FDA-1088. Where should I keep my medication? Keep out of the reach of children. Store unopened pens in a refrigerator between 2 and 8 degrees C (36 and 46 degrees F). Do not freeze. Protect from light and heat. After you first use the pen, it can be stored for 56 days at room temperature between 15 and 30 degrees C (59 and 86 degrees F) or in a refrigerator. Throw away your used pen after 56 days or after the expiration date, whichever comes first. Do not store your pen with the needle attached. If the needle is left on, medication may leak from the pen. NOTE: This sheet is a summary. It may not cover all possible information. If you have questions about this medicine, talk to your doctor, pharmacist, or health care provider.  2024 Elsevier/Gold Standard (2023-10-01 00:00:00)

## 2023-12-03 NOTE — Progress Notes (Signed)
Patient ID: Cindy Leonard, female    DOB: 1996-03-26  MRN: [id number]  CC: Diabetes (DM f/u./Requesting referral to nutrition /Yes to pap appt. No to pneumonia vax. )   Subjective: Cindy Leonard is a 28 y.o. female who presents for 2 mth f/u for chronic ds management. Her concerns today include:  Patient with history of  DM type II, morbid obesity,  bipolar 1/schizophrenia/depression,   DM: Results for orders placed or performed in visit on 09/28/23  POCT glucose (manual entry)   Collection Time: 09/28/23 11:14 AM  Result Value Ref Range   POC Glucose 141 (A) 70 - 99 mg/dl  POCT glycosylated hemoglobin (Hb A1C)   Collection Time: 09/28/23 11:20 AM  Result Value Ref Range   Hemoglobin A1C     HbA1c POC (<> result, manual entry)     HbA1c, POC (prediabetic range)     HbA1c, POC (controlled diabetic range) 7.8 (A) 0.0 - 7.0 %  On last visit, we increase Trulicity to 1.5 mg once a week to help with getting A1C better and bring about wgh loss. -tolerating med.  Also taking the Lantus 60 units daily and Metformin XR 500 mg daily. Has a loose BM every morning after taking Metformin. Feels she is eating less but wgh continues to increase.  Drinking more water, not eating chips any more.  Eating more salads and wheat bread.  -now ready to meet with a nutritionist. Did get Diovan 40 mg and taking it consistently.  Did not take the med as yet this morning.  Limits salt in foods.   Patient Active Problem List   Diagnosis Date Noted   Schizoaffective disorder, bipolar type (HCC) 08/04/2022   Generalized anxiety disorder 08/04/2022   Insomnia 08/04/2022   Diabetes (HCC) 04/04/2020     Current Outpatient Medications on File Prior to Visit  Medication Sig Dispense Refill   Accu-Chek Softclix Lancets lancets Check BS 3x/[place] with meals 100 each 12   Accu-Chek Softclix Lancets lancets Check blood sugars 3 times a [place] before meals. 100 each 12   Blood Glucose Monitoring Suppl (ACCU-CHEK  GUIDE) w/Device KIT Used to check blood sugars 3 times a Fillingim 1 kit 0   glucose blood (ACCU-CHEK GUIDE) test strip Check blood sugars 3 times a [place] before meals. 100 each 12   haloperidol decanoate (HALDOL DECANOATE) 100 MG/ML injection Inject 1 mL (100 mg total) into the muscle every 28 (twenty-eight) days. 1 mL 11   hydrOXYzine (ATARAX) 10 MG tablet Take 1 tablet (10 mg total) by mouth 3 (three) times daily as needed. 90 tablet 3   insulin glargine (LANTUS) 100 UNIT/ML Solostar Pen Inject 60 Units into the skin daily. 15 mL 6   Insulin Pen Needle 31G X 8 MM MISC Use to inject insulin once daily. 100 each 6   Lancets (ACCU-CHEK SOFT TOUCH) lancets Use as instructed 100 each 12   metFORMIN (GLUCOPHAGE-XR) 500 MG 24 hr tablet Take 1 tablet (500 mg total) by mouth daily with breakfast. 90 tablet 1   traZODone (DESYREL) 50 MG tablet Take 1 tablet (50 mg total) by mouth at bedtime as needed for sleep. 30 tablet 3   valsartan (DIOVAN) 40 MG tablet Take 1 tablet (40 mg total) by mouth daily. 90 tablet 3   FLUoxetine (PROZAC) 40 MG capsule Take 1 capsule (40 mg total) by mouth daily. 30 capsule 0   No current facility-administered medications on file prior to visit.    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   Sexual activity: Yes    Birth control/protection: None  Other Topics Concern   Not on file  Social History  Narrative   Not on file   Social Drivers of Health   Financial Resource Strain: Low Risk  (09/09/2023)   Overall Financial Resource Strain (CARDIA)    Difficulty of Paying Living Expenses: Not hard at all  Food Insecurity: No Food Insecurity (09/09/2023)   Hunger Vital Sign    Worried About Running Out of Food in the Last Year: Never true    Ran Out of Food in the Last Year: Never true  Transportation Needs: No Transportation Needs (09/09/2023)   PRAPARE - Administrator, Civil Service (Medical): No    Lack of Transportation (Non-Medical): No  Physical Activity: Inactive (09/09/2023)   Exercise Vital Sign    Days of Exercise per Week: 0 days    Minutes of Exercise per Session: 0 min  Stress: No Stress Concern Present (09/09/2023)   [employer] of Occupational Health - Occupational Stress Questionnaire    Feeling of Stress : Not at all  Social Connections: Socially Isolated (09/09/2023)   Social Connection and Isolation Panel [NHANES]    Frequency of Communication with Friends and Family: Once a week    Frequency of Social Gatherings with Friends and Family: Never    Attends Religious Services: Never    Database administrator or Organizations: No    Attends Banker Meetings: Never    Marital Status: Never married  Intimate Partner Violence: Not At Risk (09/09/2023)   Humiliation, Afraid, Rape, and Kick questionnaire    Fear of Current or Ex-Partner: No    Emotionally Abused: No    Physically Abused: No    Sexually Abused: No    Family History  Problem Relation Age of Onset   Stroke Mother    Diabetes Other    Hypertension Other    Asthma Father    Asthma Brother     Past Surgical History:  Procedure Laterality Date   INCISION AND DRAINAGE ABSCESS Left 12/08/2020   Procedure: INCISION AND DRAINAGE POSTERIOR NECK ABSCESS;  Surgeon: Gaynelle Adu, MD;  Location: [facility] OR;  Service: General;  Laterality: Left;   NO PAST SURGERIES      ROS: Review of  Systems Negative except as stated above  PHYSICAL EXAM: BP 121/83 (BP Location: Left Arm, Patient Position: Sitting, Cuff Size: Large)   Pulse 87   Temp 98.1 F (36.7 C) (Oral)   Ht 5\' 7"  (1.702 m)   Wt (!) 314 lb (142.4 kg)   SpO2 97%   BMI 49.18 kg/m   Wt Readings from Last 3 Encounters:  12/03/23 (!) 314 lb (142.4 kg)  09/28/23 (!) 309  lb (140.2 kg)  06/01/23 283 lb (128.4 kg)    Physical Exam  General appearance - alert, well appearing, obese young African-American female and in no distress Mental status - normal mood, behavior, speech, dress, motor activity, and thought processes Chest - clear to auscultation, no wheezes, rales or rhonchi, symmetric air entry Heart - normal rate, regular rhythm, normal S1, S2, no murmurs, rubs, clicks or gallops Extremities - peripheral pulses normal, no pedal edema, no clubbing or cyanosis      Latest Ref Rng & Units 06/01/2023    2:53 PM 11/22/2022    2:30 PM 08/06/2022    6:40 PM  CMP  Glucose 70 - 99 mg/dL 914  782  956   BUN 6 - 20 mg/dL 11  11  14    Creatinine 0.57 - 1.00 mg/dL 2.13  0.86  5.78   Sodium 134 - 144 mmol/L 140  135  138   Potassium 3.5 - 5.2 mmol/L 4.5  4.1  3.8   Chloride 96 - 106 mmol/L 102  102  106   CO2 20 - 29 mmol/L 22  24  27    Calcium 8.7 - 10.2 mg/dL 9.8  9.0  9.2   Total Protein 6.0 - 8.5 g/dL 7.0  7.6  7.1   Total Bilirubin 0.0 - 1.2 mg/dL <4.6  0.6  0.3   Alkaline Phos 44 - 121 IU/L 76  52  63   AST 0 - 40 IU/L 13  16  15    ALT 0 - 32 IU/L 19  19  21     Lipid Panel     Component Value Date/Time   CHOL 136 08/06/2022 1840   TRIG 266 (H) 08/06/2022 1840   HDL 23 (L) 08/06/2022 1840   CHOLHDL 5.9 08/06/2022 1840   VLDL 53 (H) 08/06/2022 1840   LDLCALC 60 08/06/2022 1840   LDLDIRECT 74.6 02/11/2022 1325    CBC    Component Value Date/Time   WBC 7.7 11/22/2022 1430   RBC 5.10 11/22/2022 1430   HGB 14.3 11/22/2022 1430   HCT 43.0 11/22/2022 1430   PLT 275 11/22/2022 1430   MCV 84.3  11/22/2022 1430   MCH 28.0 11/22/2022 1430   MCHC 33.3 11/22/2022 1430   RDW 13.3 11/22/2022 1430   LYMPHSABS 4.3 (H) 08/06/2022 1840   MONOABS 0.7 08/06/2022 1840   EOSABS 0.3 08/06/2022 1840   BASOSABS 0.1 08/06/2022 1840    ASSESSMENT AND PLAN: 1. Type 2 diabetes mellitus with morbid obesity (HCC) (Primary) Wgh continues to increase despite taking Trulicity. Dietary counseling given; patient now agreeable to seeing a nutritionist.  Referral submitted. Encouraged her to continue moving as much as she can. We discussed changing from Trulicity to Ozempic to see if we would have a greater response in terms of weight loss.  Patient is agreeable to this.  We will start her on the 0.5 mg dose of Ozempic.  I went over possible side effects of the medication including nausea, vomiting, pancreatitis, bowel blockage, severe diarrhea/constipation.  Advised patient that if she develops any vomiting, abdominal pain or severe diarrhea/constipation, she should stop the medicine and come in to be seen.  After being on the medication for 1 month, if she is tolerating it, she will send me a MyChart message so that we can increase the dose to 1 mg.  She will follow-up with me in 2 months for recheck and to have Pap smear done. - Semaglutide,0.25 or 0.5MG /DOS, (OZEMPIC, 0.25 OR  0.5 MG/DOSE,) 2 MG/3ML SOPN; Inject 0.5 mg into the skin once a week.  Dispense: 3 mL; Refill: 1 - Microalbumin / creatinine urine ratio  2. Hypertension associated with type 2 diabetes mellitus (HCC) Close to goal.  She has not taken Diovan as yet for the morning.  She will take it as soon as she returns home.  Continue Diovan 40 mg daily  3. Pneumococcal vaccination declined  Patient was given the opportunity to ask questions.  Patient verbalized understanding of the plan and was able to repeat key elements of the plan.   This documentation was completed using Paediatric nurse.  Any transcriptional errors are  unintentional.  Orders Placed This Encounter  Procedures   Microalbumin / creatinine urine ratio     Requested Prescriptions   Signed Prescriptions Disp Refills   Semaglutide,0.25 or 0.5MG /DOS, (OZEMPIC, 0.25 OR 0.5 MG/DOSE,) 2 MG/3ML SOPN 3 mL 1    Sig: Inject 0.5 mg into the skin once a week.    Return in about 2 months (around 01/31/2024) for for pap and f/o obesity.  Jonah Blue, MD, FACP

## 2023-12-04 LAB — MICROALBUMIN / CREATININE URINE RATIO
Creatinine, Urine: 134 mg/dL
Microalb/Creat Ratio: 8 mg/g{creat} (ref 0–29)
Microalbumin, Urine: 10.6 ug/mL

## 2023-12-05 ENCOUNTER — Encounter: Payer: Self-pay | Admitting: Internal Medicine

## 2023-12-06 ENCOUNTER — Other Ambulatory Visit: Payer: Self-pay

## 2024-01-04 ENCOUNTER — Encounter: Payer: Self-pay | Admitting: Internal Medicine

## 2024-01-04 ENCOUNTER — Other Ambulatory Visit: Payer: Self-pay | Admitting: Internal Medicine

## 2024-01-04 ENCOUNTER — Telehealth: Payer: Self-pay

## 2024-01-04 DIAGNOSIS — E1169 Type 2 diabetes mellitus with other specified complication: Secondary | ICD-10-CM

## 2024-01-04 MED ORDER — INSULIN GLARGINE 100 UNIT/ML SOLOSTAR PEN: 60.0000 [IU] | PEN_INJECTOR | Freq: Every day | SUBCUTANEOUS | 6 refills | Status: DC

## 2024-01-04 NOTE — Telephone Encounter (Signed)
 Patient is wanting to know if she still is to take her Lantus insulin, she states that it was not filled when she picked up her medication. Patient is using [facility] on [address].   Copied from CRM [id number]. Topic: Clinical - Medication Question >> Jan 04, 2024  3:32 PM Higinio Roger wrote: Reason for CRM: Patient went to pharmacy to pickup medications and was unsure if she needed to continue Lancets (ACCU-CHEK SOFT TOUCH) lancets. There were no medication available for pickup. Patient just started Semaglutide,0.25 or 0.5MG /DOS, (OZEMPIC, 0.25 OR 0.5 MG/DOSE,) 2 MG/3ML SOPN. Callback #: [phone]

## 2024-01-24 ENCOUNTER — Other Ambulatory Visit: Payer: Self-pay | Admitting: Internal Medicine

## 2024-01-24 DIAGNOSIS — E1169 Type 2 diabetes mellitus with other specified complication: Secondary | ICD-10-CM

## 2024-01-24 NOTE — Telephone Encounter (Signed)
 Copied from CRM [id number]. Topic: Clinical - Medication Refill >> Jan 24, 2024 11:54 AM Fuller Mandril wrote: Most Recent Primary Care Visit:  Provider: Jonah Blue B  Department: [facility]  Visit Type: OFFICE VISIT  Date: 12/03/2023  Medication: Semaglutide,0.25 or 0.5MG /DOS, (OZEMPIC, 0.25 OR 0.5 MG/DOSE,) 2 MG/3ML SOPN  Has the patient contacted their pharmacy? Yes (Agent: If no, request that the patient contact the pharmacy for the refill. If patient does not wish to contact the pharmacy document the reason why and proceed with request.) (Agent: If yes, when and what did the pharmacy advise?)  Is this the correct pharmacy for this prescription? Yes If no, delete pharmacy and type the correct one.  This is the patient's preferred pharmacy:  [facility] [address], [place] - [address] [place] [address] Phone: [phone] Fax: [phone]  Has the prescription been filled recently? No  Is the patient out of the medication? N/A - said almost about there   Has the patient been seen for an appointment in the last year OR does the patient have an upcoming appointment? Yes  Can we respond through MyChart? Yes  Agent: Please be advised that Rx refills may take up to 3 business days. We ask that you follow-up with your pharmacy.

## 2024-01-25 MED ORDER — OZEMPIC (0.25 OR 0.5 MG/DOSE) 2 MG/3ML ~~LOC~~ SOPN: 0.5000 mg | PEN_INJECTOR | SUBCUTANEOUS | 1 refills | Status: DC

## 2024-01-25 NOTE — Telephone Encounter (Signed)
 Requested Prescriptions  Pending Prescriptions Disp Refills   Semaglutide,0.25 or 0.5MG /DOS, (OZEMPIC, 0.25 OR 0.5 MG/DOSE,) 2 MG/3ML SOPN 3 mL 1    Sig: Inject 0.5 mg into the skin once a week.     Endocrinology:  Diabetes - GLP-1 Receptor Agonists - semaglutide Failed - 01/25/2024  5:24 PM      Failed - HBA1C in normal range and within 180 days    HbA1c, POC (controlled diabetic range)  Date Value Ref Range Status  09/28/2023 7.8 (A) 0.0 - 7.0 % Final         Passed - Cr in normal range and within 360 days    Creatinine, Ser  Date Value Ref Range Status  06/01/2023 0.74 0.57 - 1.00 mg/dL Final         Passed - Valid encounter within last 6 months    Recent Outpatient Visits           1 month ago Type 2 diabetes mellitus with morbid obesity (HCC)   [facility] [place]. [facility] Jonah Blue B, MD   3 months ago Type 2 diabetes mellitus with morbid obesity [facility])   [facility] [place]. [facility] Jonah Blue B, MD   7 months ago Type 2 diabetes mellitus with morbid obesity [facility])   [place] [facility] - [facility] Dept Of [place]. [facility] Jonah Blue B, MD   1 year ago Type 2 diabetes mellitus with morbid obesity [facility])   [facility] - [facility] Of [place] Marcine Matar, MD   2 years ago No-show for appointment   [facility] - A Dept Of [place]. [facility] Marcine Matar, MD       Future Appointments             In 2 weeks Marcine Matar, MD [facility] - A Dept Of [place]igha [facility]. [facility]

## 2024-01-27 ENCOUNTER — Ambulatory Visit: Payer: Self-pay

## 2024-01-27 NOTE — Telephone Encounter (Signed)
 Noted.

## 2024-01-27 NOTE — Telephone Encounter (Signed)
 Copied from CRM [id number]. Topic: Clinical - Red Word Triage >> Jan 27, 2024  9:41 AM Gildardo Pounds wrote: Red Word that prompted transfer to Nurse Triage: Patient is diabetic and sugar has been in the 70s for almost 2 weeks. Callback number is [phone] Reason for Disposition  Nursing judgment or information in reference  Answer Assessment - Initial Assessment Questions 1. REASON FOR CALL: "What is your main concern right now?"     Questions regarding blood glucose level in the 70's This nurse was able to answer questions.  Patient was given list of hypoglycemia symptoms to watch out for.  Protocols used: No Guideline Available-A-AH

## 2024-01-31 ENCOUNTER — Ambulatory Visit: Payer: MEDICAID | Admitting: Internal Medicine

## 2024-02-07 ENCOUNTER — Ambulatory Visit: Payer: MEDICAID | Admitting: Internal Medicine

## 2024-02-14 ENCOUNTER — Other Ambulatory Visit (HOSPITAL_COMMUNITY)
Admission: RE | Admit: 2024-02-14 | Discharge: 2024-02-14 | Disposition: A | Discharge status: Home / Self Care | Payer: MEDICAID | Source: Ambulatory Visit | Attending: Internal Medicine | Admitting: Internal Medicine

## 2024-02-14 ENCOUNTER — Ambulatory Visit: Payer: MEDICAID | Attending: Internal Medicine | Admitting: Internal Medicine

## 2024-02-14 ENCOUNTER — Encounter: Payer: Self-pay | Admitting: Internal Medicine

## 2024-02-14 DIAGNOSIS — Z124 Encounter for screening for malignant neoplasm of cervix: Secondary | ICD-10-CM | POA: Insufficient documentation

## 2024-02-14 DIAGNOSIS — Z6841 Body Mass Index (BMI) 40.0 and over, adult: Secondary | ICD-10-CM

## 2024-02-14 DIAGNOSIS — Z7984 Long term (current) use of oral hypoglycemic drugs: Secondary | ICD-10-CM | POA: Diagnosis not present

## 2024-02-14 DIAGNOSIS — Z7985 Long-term (current) use of injectable non-insulin antidiabetic drugs: Secondary | ICD-10-CM | POA: Diagnosis not present

## 2024-02-14 DIAGNOSIS — E119 Type 2 diabetes mellitus without complications: Secondary | ICD-10-CM

## 2024-02-14 DIAGNOSIS — Z794 Long term (current) use of insulin: Secondary | ICD-10-CM

## 2024-02-14 DIAGNOSIS — E1169 Type 2 diabetes mellitus with other specified complication: Secondary | ICD-10-CM

## 2024-02-14 LAB — GLUCOSE, POCT (MANUAL RESULT ENTRY): POC Glucose: 172 mg/dL — AB (ref 70–99)

## 2024-02-14 LAB — POCT GLYCOSYLATED HEMOGLOBIN (HGB A1C): HbA1c, POC (controlled diabetic range): 8.4 % — AB (ref 0.0–7.0)

## 2024-02-14 MED ORDER — SEMAGLUTIDE (1 MG/DOSE) 4 MG/3ML ~~LOC~~ SOPN: 1.0000 mg | PEN_INJECTOR | SUBCUTANEOUS | 3 refills | Status: DC

## 2024-02-14 NOTE — Progress Notes (Signed)
 Patient ID: Cindy Leonard, female    DOB: 1996-01-19  MRN: [id number]  CC: Abnormal Pap Smear (Pap. Franchot Erichsen weight loss)   Subjective: Cindy Leonard is a 28 y.o. female who presents for pap and f/u obesity Her concerns today include:  Patient with history of  DM type II, morbid obesity,  bipolar 1/schizophrenia/depression,   GYN History:  Pt is G0P0 Any hx of abn paps?: no Menses regular or irregular?: regular How long does menses last? 4-5 days Menstrual flow light or heavy?: heavy Method of birth control?:  no, pt has female partners only Any vaginal dischg at this time?: no Dysuria?: no Any hx of STI?: Chlamydia; treated Sexually active with how many partners: one female partner Desires STI screen: no Last MMG: NA Family hx of uterine, cervical or breast cancer?:  no  Obesity/DM: Results for orders placed or performed in visit on 02/14/24  POCT glucose (manual entry)   Collection Time: 02/14/24 10:12 AM  Result Value Ref Range   POC Glucose 172 (A) 70 - 99 mg/dl  POCT glycosylated hemoglobin (Hb A1C)   Collection Time: 02/14/24 10:39 AM  Result Value Ref Range   Hemoglobin A1C     HbA1c POC (<> result, manual entry)     HbA1c, POC (prediabetic range)     HbA1c, POC (controlled diabetic range) 8.4 (A) 0.0 - 7.0 %  A1C increased from 7.8 in Nov 2024. Walking 2 and from work daily which total is 40 mins She has cut out bread. No sodas.  We changed from Trulicity to Ozempic on last visit; she is currently on 0.5 mg once a week.  It has decreased her appetite. Has decreased 2 clothes sizes.  She is discouraged that she has gained 6 lbs since last visit in January.  She also continues to take metformin 500 mg once a Sinyard and Lantus insulin 50 units (not 60 units) daily. Tolerating Ozempic without S.E  Patient Active Problem List   Diagnosis Date Noted   Schizoaffective disorder, bipolar type (HCC) 08/04/2022   Generalized anxiety disorder 08/04/2022   Insomnia  08/04/2022   Diabetes (HCC) 04/04/2020     Current Outpatient Medications on File Prior to Visit  Medication Sig Dispense Refill   Accu-Chek Softclix Lancets lancets Check BS 3x/[place]ffsinger with meals 100 each 12   Accu-Chek Softclix Lancets lancets Check blood sugars 3 times a Ferrie before meals. 100 each 12   Blood Glucose Monitoring Suppl (ACCU-CHEK GUIDE) w/Device KIT Used to check blood sugars 3 times a [place]ler 1 kit 0   glucose blood (ACCU-CHEK GUIDE) test strip Check blood sugars 3 times a Hannold before meals. 100 each 12   haloperidol decanoate (HALDOL DECANOATE) 100 MG/ML injection Inject 1 mL (100 mg total) into the muscle every 28 (twenty-eight) days. 1 mL 11   hydrOXYzine (ATARAX) 10 MG tablet Take 1 tablet (10 mg total) by mouth 3 (three) times daily as needed. 90 tablet 3   insulin glargine (LANTUS) 100 UNIT/ML Solostar Pen Inject 60 Units into the skin daily. 15 mL 6   Insulin Pen Needle 31G X 8 MM MISC Use to inject insulin once daily. 100 each 6   Lancets (ACCU-CHEK SOFT TOUCH) lancets Use as instructed 100 each 12   metFORMIN (GLUCOPHAGE-XR) 500 MG 24 hr tablet Take 1 tablet (500 mg total) by mouth daily with breakfast. 90 tablet 1   traZODone (DESYREL) 50 MG tablet Take 1 tablet (50 mg total) by mouth at bedtime as  needed for sleep. 30 tablet 3   valsartan (DIOVAN) 40 MG tablet Take 1 tablet (40 mg total) by mouth daily. 90 tablet 3   FLUoxetine (PROZAC) 40 MG capsule Take 1 capsule (40 mg total) by mouth daily. 30 capsule 0   No current facility-administered medications on file prior to visit.    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   Sexual activity: Yes    Birth control/protection: None  Other Topics Concern   Not on file  Social History Narrative   Not on file   Social Drivers of Health   Financial Resource Strain: Low Risk  (09/09/2023)   Overall Financial Resource Strain (CARDIA)    Difficulty of Paying Living Expenses: Not hard at all  Food Insecurity: No Food Insecurity (09/09/2023)   Hunger Vital Sign    Worried About Running Out of Food in the Last Year: Never true    Ran Out of Food in the Last Year: Never true  Transportation Needs: No Transportation Needs (09/09/2023)   PRAPARE - Administrator, Civil Service (Medical): No    Lack of Transportation (Non-Medical): No  Physical Activity: Inactive (09/09/2023)   Exercise Vital Sign    Days of Exercise per Week: 0 days    Minutes of Exercise per Session: 0 min  Stress: No Stress Concern Present (09/09/2023)   [employer] of Occupational Health - Occupational Stress Questionnaire    Feeling of Stress : Not at all  Social Connections: Socially Isolated (09/09/2023)   Social Connection and Isolation Panel [NHANES]    Frequency of Communication with Friends and Family: Once a week    Frequency of Social Gatherings with Friends and Family: Never    Attends Religious Services: Never    Database administrator or Organizations: No    Attends Banker Meetings: Never    Marital Status: Never married  Intimate Partner Violence: Not At Risk (09/09/2023)   Humiliation, Afraid, Rape, and Kick questionnaire    Fear of Current or Ex-Partner: No    Emotionally Abused: No    Physically Abused: No    Sexually Abused: No    Family History  Problem Relation Age of Onset   Stroke Mother    Diabetes Other     Hypertension Other    Asthma Father    Asthma Brother     Past Surgical History:  Procedure Laterality Date   INCISION AND DRAINAGE ABSCESS Left 12/08/2020   Procedure: INCISION AND DRAINAGE POSTERIOR NECK ABSCESS;  Surgeon: Gaynelle Adu, MD;  Location: [facility] OR;  Service: General;  Laterality: Left;   NO PAST SURGERIES      ROS: Review of Systems Negative except as stated above  PHYSICAL EXAM: BP 114/80 (BP Location: Left Arm, Patient  Position: Sitting, Cuff Size: Normal)   Pulse 82   Temp 98 F (36.7 C) (Oral)   Ht 5\' 7"  (1.702 m)   Wt (!) 320 lb (145.2 kg)   SpO2 97%   BMI 50.12 kg/m   Wt Readings from Last 3 Encounters:  02/14/24 (!) 320 lb (145.2 kg)  12/03/23 (!) 314 lb (142.4 kg)  09/28/23 (!) 309 lb (140.2 kg)    Physical Exam  General appearance - alert, well appearing, obese young African-American female and in no distress Mental status - normal mood, behavior, speech, dress, motor activity, and thought processes Pelvic -CMA Clarisa present:  normal external genitalia, vulva, vagina, cervix, uterus and adnexa      Latest Ref Rng & Units 06/01/2023    2:53 PM 11/22/2022    2:30 PM 08/06/2022    6:40 PM  CMP  Glucose 70 - 99 mg/dL 161  096  045   BUN 6 - 20 mg/dL 11  11  14    Creatinine 0.57 - 1.00 mg/dL 4.09  8.11  9.14   Sodium 134 - 144 mmol/L 140  135  138   Potassium 3.5 - 5.2 mmol/L 4.5  4.1  3.8   Chloride 96 - 106 mmol/L 102  102  106   CO2 20 - 29 mmol/L 22  24  27    Calcium 8.7 - 10.2 mg/dL 9.8  9.0  9.2   Total Protein 6.0 - 8.5 g/dL 7.0  7.6  7.1   Total Bilirubin 0.0 - 1.2 mg/dL <7.8  0.6  0.3   Alkaline Phos 44 - 121 IU/L 76  52  63   AST 0 - 40 IU/L 13  16  15    ALT 0 - 32 IU/L 19  19  21     Lipid Panel     Component Value Date/Time   CHOL 136 08/06/2022 1840   TRIG 266 (H) 08/06/2022 1840   HDL 23 (L) 08/06/2022 1840   CHOLHDL 5.9 08/06/2022 1840   VLDL 53 (H) 08/06/2022 1840   LDLCALC 60 08/06/2022 1840   LDLDIRECT 74.6 02/11/2022  1325    CBC    Component Value Date/Time   WBC 7.7 11/22/2022 1430   RBC 5.10 11/22/2022 1430   HGB 14.3 11/22/2022 1430   HCT 43.0 11/22/2022 1430   PLT 275 11/22/2022 1430   MCV 84.3 11/22/2022 1430   MCH 28.0 11/22/2022 1430   MCHC 33.3 11/22/2022 1430   RDW 13.3 11/22/2022 1430   LYMPHSABS 4.3 (H) 08/06/2022 1840   MONOABS 0.7 08/06/2022 1840   EOSABS 0.3 08/06/2022 1840   BASOSABS 0.1 08/06/2022 1840    ASSESSMENT AND PLAN: 1. Type 2 diabetes mellitus with morbid obesity (HCC) (Primary) A1c not at goal.  Weight has increased despite being on the Ozempic.  Continue to encourage healthy eating habits.  Recommend increasing the Ozempic from the 0.5 mg once a week to 1 mg once a week.  Advised to monitor for side effects and let me know if she develops any vomiting, or abdominal pain, severe diarrhea or constipation with the increased dose.  Continue Lantus insulin 50 units daily and metformin 500 mg daily. - POCT glycosylated hemoglobin (Hb A1C) - POCT glucose (manual entry) - Semaglutide, 1 MG/DOSE, 4 MG/3ML SOPN; Inject 1 mg as directed once a week.  Dispense: 3 mL; Refill: 3 - Amb ref to Medical Nutrition Therapy-MNT  2. Insulin long-term use (HCC) 3. Long-term (current) use of injectable non-insulin antidiabetic drugs 4.  Diabetes mellitus treated with oral medication (HCC) See #1 above.  5. Pap smear for cervical cancer screening - Cytology - PAP - Amb ref to Medical Nutrition Therapy-MNT   Patient was given the opportunity to ask questions.  Patient verbalized understanding of the plan and was able to repeat key elements of the plan.   This documentation was completed using Paediatric nurse.  Any transcriptional errors are unintentional.  Orders Placed This Encounter  Procedures   Amb ref to Medical Nutrition Therapy-MNT   POCT glycosylated hemoglobin (Hb A1C)   POCT glucose (manual entry)     Requested Prescriptions   Signed  Prescriptions Disp Refills   Semaglutide, 1 MG/DOSE, 4 MG/3ML SOPN 3 mL 3    Sig: Inject 1 mg as directed once a week.    Return in about 3 months (around 05/15/2024).  Concetta Dee, MD, FACP

## 2024-02-14 NOTE — Patient Instructions (Signed)
 Increase Ozempic to 1 mg once a week. We have referred you to a nutritionist.

## 2024-02-17 LAB — CYTOLOGY - PAP
Comment: NEGATIVE
Diagnosis: NEGATIVE
High risk HPV: NEGATIVE

## 2024-02-18 ENCOUNTER — Other Ambulatory Visit: Payer: Self-pay | Admitting: Internal Medicine

## 2024-02-18 ENCOUNTER — Encounter: Payer: Self-pay | Admitting: Internal Medicine

## 2024-02-18 ENCOUNTER — Ambulatory Visit: Payer: Self-pay | Admitting: Internal Medicine

## 2024-02-18 MED ORDER — METRONIDAZOLE 500 MG PO TABS: 500.0000 mg | ORAL_TABLET | Freq: Two times a day (BID) | ORAL | 0 refills | Status: DC

## 2024-02-18 NOTE — Telephone Encounter (Signed)
 Reached out to pt to see what concerns she is having. Pt didn't answer lvm letting pt know that office is closed but she can send a MyChart message in regards to the concerns that she is having or she can speak with someone at the call center when she calls.

## 2024-02-18 NOTE — Telephone Encounter (Signed)
 Patient called back and wanted to let the Nurse Angie that she spoke with earlier know that she greatly appreciated everything and patient states that she will follow up with her provider with any further questions. Patient calm and kind during this phone call and just wanted to express her appreciation to the care Nurse Angie gave during their phone call.

## 2024-02-18 NOTE — Telephone Encounter (Signed)
 FYI

## 2024-02-18 NOTE — Telephone Encounter (Signed)
  Chief Complaint: requesting clarification regarding recent dx trichomoniasis and threatening violence against partner after her workday is over. Symptoms: upset angry, cursing due to recent dx for sti. Patient asking how trichomoniasis is transmitted. Denies sx of STI at this time.  Frequency: today  Pertinent Negatives: Patient denies na Disposition: [] ED /[] Urgent Care (no appt availability in office) / [] Appointment(In office/virtual)/ []  [facility]/ [] Home Care/ [] Refused Recommended Disposition /[] [facility] [facility]/ [x]  Follow-up with PCP Additional Notes:     Reviewed with patient trichomoniasis is Almost always transmitted sexually, caused by parasite that can be spread form person to person through sexual contact, vaginal , anal or oral sex . Sx may not appear for a while . Provided patient with emotional support and recommended patient not to choose violence due to recent dx. Patient calm and did not verbalize any angry threats at this time.       Copied from CRM [id number]. Topic: Clinical - Red Word Triage >> Feb 18, 2024  9:17 AM Baldemar Lev wrote: Red Word that prompted transfer to Nurse Triage: STD, threatening violence Reason for Disposition  [1] Follow-up call to recent contact AND [2] information only call, no triage required  Answer Assessment - Initial Assessment Questions 1. REASON FOR CALL or QUESTION: "What is your reason for calling today?" or "How can I best help you?" or "What question do you have that I r help answer?"     Requesting clarification regarding recent diagnosis for trichomoniasis.  Protocols used: Information Only Call - No Triage-A-AH

## 2024-02-28 ENCOUNTER — Ambulatory Visit: Payer: MEDICAID | Admitting: Skilled Nursing Facility1

## 2024-03-29 ENCOUNTER — Telehealth: Payer: Self-pay | Admitting: Internal Medicine

## 2024-03-30 ENCOUNTER — Other Ambulatory Visit: Payer: Self-pay | Admitting: Internal Medicine

## 2024-03-30 DIAGNOSIS — Z794 Long term (current) use of insulin: Secondary | ICD-10-CM

## 2024-03-30 DIAGNOSIS — E1169 Type 2 diabetes mellitus with other specified complication: Secondary | ICD-10-CM

## 2024-03-30 NOTE — Telephone Encounter (Unsigned)
 Copied from CRM [id number]. Topic: Clinical - Medication Refill >> Mar 29, 2024  3:02 PM Essie A wrote: Medication: Blood Glucose Monitoring Suppl (ACCU-CHEK GUIDE) w/Device KIT  Has the patient contacted their pharmacy? Yes (Agent: If no, request that the patient contact the pharmacy for the refill. If patient does not wish to contact the pharmacy document the reason why and proceed with request.) (Agent: If yes, when and what did the pharmacy advise?)  This is the patient's preferred pharmacy:  [facility] [address] [place] - [address] [place] [address] Phone: [phone] Fax: [phone]  Phone: [phone] Fax: [phone]  Is this the correct pharmacy for this prescription? Yes If no, delete pharmacy and type the correct one.   Has the prescription been filled recently? No. Lost her previous kit  Is the patient out of the medication? Yes  Has the patient been seen for an appointment in the last year OR does the patient have an upcoming appointment? Yes  Can we respond through MyChart? Yes  Agent: Please be advised that Rx refills may take up to 3 business days. We ask that you follow-up with your pharmacy.

## 2024-03-31 MED ORDER — ACCU-CHEK GUIDE W/DEVICE KIT: PACK | 0 refills | Status: AC

## 2024-03-31 NOTE — Telephone Encounter (Signed)
 Requested medication (s) are due for refill today: na  Requested medication (s) are on the active medication list: yes   Last refill:  06/01/23 1 kit 0 refills  Future visit scheduled: no  Notes to clinic:  requesting accucheck kit. Do you want to order ?     Requested Prescriptions  Pending Prescriptions Disp Refills   Blood Glucose Monitoring Suppl (ACCU-CHEK GUIDE) w/Device KIT 1 kit 0    Sig: Used to check blood sugars 3 times a Tew     Endocrinology: Diabetes - Testing Supplies Passed - 03/31/2024  2:48 PM      Passed - Valid encounter within last 12 months    Recent Outpatient Visits           1 month ago Type 2 diabetes mellitus with morbid obesity (HCC)   [facility] Of [place]. [facility] Concetta Dee B, MD   3 months ago Type 2 diabetes mellitus with morbid obesity [facility])   [place] [facility] - A Dept Of [place]. [facility] Concetta Dee B, MD   6 months ago Type 2 diabetes mellitus with morbid obesity [facility])   [place] [facility] - A Dept Of [place]. [facility] Concetta Dee B, MD   10 months ago Type 2 diabetes mellitus with morbid obesity [facility])   [place] [facility] - A Dept Of [place]. [facility] Concetta Dee B, MD   1 year ago Type 2 diabetes mellitus with morbid obesity [facility])    [facility] - A Dept Of [place]. [facility] [place] Presume, MD

## 2024-05-15 ENCOUNTER — Ambulatory Visit: Payer: MEDICAID | Admitting: Internal Medicine

## 2024-06-09 ENCOUNTER — Other Ambulatory Visit: Payer: Self-pay | Admitting: Internal Medicine

## 2024-06-09 DIAGNOSIS — E1169 Type 2 diabetes mellitus with other specified complication: Secondary | ICD-10-CM

## 2024-06-16 ENCOUNTER — Telehealth: Payer: Self-pay | Admitting: Internal Medicine

## 2024-06-16 NOTE — Telephone Encounter (Signed)
 Confirmed appt for 8/18

## 2024-06-19 ENCOUNTER — Ambulatory Visit: Payer: MEDICAID | Admitting: Internal Medicine

## 2024-07-06 ENCOUNTER — Other Ambulatory Visit: Payer: Self-pay | Admitting: Internal Medicine

## 2024-07-06 DIAGNOSIS — E1169 Type 2 diabetes mellitus with other specified complication: Secondary | ICD-10-CM

## 2024-07-26 ENCOUNTER — Telehealth: Payer: Self-pay | Admitting: Internal Medicine

## 2024-07-26 NOTE — Telephone Encounter (Signed)
 Unbale to lvm to confirm appt for 9/25

## 2024-07-27 ENCOUNTER — Encounter: Payer: Self-pay | Admitting: Internal Medicine

## 2024-07-27 ENCOUNTER — Ambulatory Visit: Payer: MEDICAID | Attending: Internal Medicine | Admitting: Internal Medicine

## 2024-07-27 DIAGNOSIS — E1159 Type 2 diabetes mellitus with other circulatory complications: Secondary | ICD-10-CM

## 2024-07-27 DIAGNOSIS — E119 Type 2 diabetes mellitus without complications: Secondary | ICD-10-CM | POA: Diagnosis not present

## 2024-07-27 DIAGNOSIS — Z2821 Immunization not carried out because of patient refusal: Secondary | ICD-10-CM | POA: Diagnosis not present

## 2024-07-27 DIAGNOSIS — Z7984 Long term (current) use of oral hypoglycemic drugs: Secondary | ICD-10-CM

## 2024-07-27 DIAGNOSIS — Z7985 Long-term (current) use of injectable non-insulin antidiabetic drugs: Secondary | ICD-10-CM

## 2024-07-27 DIAGNOSIS — Z794 Long term (current) use of insulin: Secondary | ICD-10-CM

## 2024-07-27 DIAGNOSIS — E1169 Type 2 diabetes mellitus with other specified complication: Secondary | ICD-10-CM | POA: Diagnosis not present

## 2024-07-27 DIAGNOSIS — I152 Hypertension secondary to endocrine disorders: Secondary | ICD-10-CM

## 2024-07-27 LAB — POCT GLYCOSYLATED HEMOGLOBIN (HGB A1C): HbA1c, POC (controlled diabetic range): 7.2 % — AB (ref 0.0–7.0)

## 2024-07-27 LAB — GLUCOSE, POCT (MANUAL RESULT ENTRY): POC Glucose: 117 mg/dL — AB (ref 70–99)

## 2024-07-27 MED ORDER — SEMAGLUTIDE (2 MG/DOSE) 8 MG/3ML ~~LOC~~ SOPN: 2.0000 mg | PEN_INJECTOR | SUBCUTANEOUS | 4 refills | Status: AC

## 2024-07-27 MED ORDER — BLOOD PRESSURE CUFF MISC: 0 refills | Status: AC

## 2024-07-27 NOTE — Progress Notes (Signed)
 Patient ID: Cindy Leonard, female    DOB: Dec 31, 1995  MRN: [id number]  CC: Diabetes (DM f/u./Concern about weight loss inability /No to flu vax)   Subjective: Cindy Leonard is a 28 y.o. female who presents for chronic ds management. Her concerns today include:  DM type II, morbid obesity, HTN, bipolar 1/schizophrenia/depression  DM/Obesity: 312lbs today. Down 8 pounds since increasing Ozempic  to 1mg  in 02/2024. BMI 48.  A1C today: 7.2. 02/2024 A1c was 8.4. POCT glucose: 117 She is taking 60 U lantus  daily, metformin  500 once daily, and ozempic  1 mg once weekly. She is tolerating Ozempic  w/o SE. Blood sugars at home are 90-117 before meals. Denies hypoglycemic episodes.  She still has a decreased appetite. She eats a lot of cabbage, squash, beans, peas, baked chicken, and is trying to stay away from pork, beef, and fried foods. Tries to only eat wheat bread. Still cutting out all sodas and trying lemon water. Limits portion sizes. She has ramped up exercising to 5 hours a week. 3x a week she will go walking in various locations.  Also due for eye exam and wants a referral.  Results for orders placed or performed in visit on 07/27/24  POCT glycosylated hemoglobin (Hb A1C)   Collection Time: 07/27/24  3:00 PM  Result Value Ref Range   Hemoglobin A1C     HbA1c POC (<> result, manual entry)     HbA1c, POC (prediabetic range)     HbA1c, POC (controlled diabetic range) 7.2 (A) 0.0 - 7.0 %  POCT glucose (manual entry)   Collection Time: 07/27/24  3:00 PM  Result Value Ref Range   POC Glucose 117 (A) 70 - 99 mg/dl    HTN: BP 877/14 initial; 128/88 on recheck. Bottom number in office is often above 80. The patient takes valsartan  40 mg every night w/ great compliance. She does not have a BP cuff at home. She is limiting salt. Denies SOB, CP, leg swelling, HA, dizziness.  Schizoaffective, bipolar type: Still receives haldol  1mL monthly injections, which is quite beneficial. Denies adverse  affects.  HM Declined Hepatitis B Vaccines Declined PCV  Declined Flu vaccine   Patient Active Problem List   Diagnosis Date Noted   Schizoaffective disorder, bipolar type (HCC) 08/04/2022   Generalized anxiety disorder 08/04/2022   Insomnia 08/04/2022   Diabetes (HCC) 04/04/2020     Current Outpatient Medications on File Prior to Visit  Medication Sig Dispense Refill   Accu-Chek Softclix Lancets lancets Check BS 3x/[place] with meals 100 each 12   Accu-Chek Softclix Lancets lancets Check blood sugars 3 times a [place] before meals. 100 each 12   Blood Glucose Monitoring Suppl (ACCU-CHEK GUIDE) w/Device KIT Used to check blood sugars 3 times a [place] 1 kit 0   glucose blood (ACCU-CHEK GUIDE) test strip Check blood sugars 3 times a [place] before meals. 100 each 12   haloperidol  decanoate (HALDOL  DECANOATE) 100 MG/ML injection Inject 1 mL (100 mg total) into the muscle every 28 (twenty-eight) days. 1 mL 11   hydrOXYzine  (ATARAX ) 10 MG tablet Take 1 tablet (10 mg total) by mouth 3 (three) times daily as needed. 90 tablet 3   insulin  glargine (LANTUS ) 100 UNIT/ML Solostar Pen Inject 60 Units into the skin daily. 15 mL 6   Insulin  Pen Needle 31G X 8 MM MISC Use to inject insulin  once daily. 100 each 6   Lancets (ACCU-CHEK SOFT TOUCH) lancets Use as instructed 100 each 12   metFORMIN  (GLUCOPHAGE -XR)  500 MG 24 hr tablet Take 1 tablet (500 mg total) by mouth daily with breakfast. 90 tablet 1   traZODone  (DESYREL ) 50 MG tablet Take 1 tablet (50 mg total) by mouth at bedtime as needed for sleep. 30 tablet 3   valsartan  (DIOVAN ) 40 MG tablet Take 1 tablet (40 mg total) by mouth daily. 90 tablet 3   FLUoxetine  (PROZAC ) 40 MG capsule Take 1 capsule (40 mg total) by mouth daily. 30 capsule 0   No current facility-administered medications on file prior to visit.    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   Sexual activity: Yes    Birth control/protection: None  Other Topics Concern   Not on file  Social History Narrative   Not on file   Social Drivers of Health   Financial Resource Strain: Low Risk  (09/09/2023)   Overall Financial Resource Strain (CARDIA)    Difficulty of Paying Living Expenses: Not hard at all  Food Insecurity: No Food Insecurity (09/09/2023)   Hunger Vital Sign    Worried About Running Out of Food in the Last Year: Never true    Ran Out of Food in the Last Year: Never true  Transportation Needs: No Transportation Needs (09/09/2023)   PRAPARE - Administrator, Civil Service (Medical): No    Lack of Transportation (Non-Medical): No  Physical Activity: Inactive (09/09/2023)   Exercise Vital Sign    Days of Exercise per Week: 0 days    Minutes of Exercise per Session: 0 min  Stress: No Stress Concern Present (09/09/2023)   [employer] of Occupational Health - Occupational Stress Questionnaire    Feeling of Stress : Not at all  Social Connections: Socially Isolated (09/09/2023)   Social Connection and Isolation Panel    Frequency of Communication with Friends and Family: Once a week    Frequency of Social Gatherings with Friends and Family: Never    Attends Religious Services: Never    Database administrator or Organizations: No    Attends Banker Meetings: Never    Marital Status: Never married  Intimate Partner Violence: Not At Risk (09/09/2023)   Humiliation,  Afraid, Rape, and Kick questionnaire    Fear of Current or Ex-Partner: No    Emotionally Abused: No    Physically Abused: No    Sexually Abused: No    Family History  Problem Relation Age of Onset   Stroke Mother    Diabetes Other    Hypertension Other    Asthma Father    Asthma Brother     Past Surgical History:  Procedure Laterality Date   INCISION AND DRAINAGE ABSCESS Left 12/08/2020   Procedure: INCISION AND DRAINAGE POSTERIOR NECK ABSCESS;  Surgeon: Tanda Locus, MD;  Location: [facility]  OR;  Service: General;  Laterality: Left;   NO PAST SURGERIES      ROS: Review of Systems Negative except as stated above  PHYSICAL EXAM: BP 128/88   Pulse 81   Temp 97.8 F (36.6 C) (Oral)   Ht 5' 7 (1.702 m)   Wt (!) 312 lb (141.5 kg)   SpO2 98%   BMI 48.87 kg/m   Physical Exam  General appearance - alert, well appearing, morbidly obese AAF in no distress Mental status - normal mood, behavior, speech, dress, motor activity, and thought processes Chest - Distant lung sounds due to body habitus, but equal bilaterally Heart - Distant due to body habitus. Normal rate, regular rhythm, no murmurs, rubs, clicks or gallops Extremities - no pedal edema     Latest Ref Rng & Units 06/01/2023    2:53 PM 11/22/2022    2:30 PM 08/06/2022    6:40 PM  CMP  Glucose 70 - 99 mg/dL 758  691  840   BUN 6 - 20 mg/dL 11  11  14    Creatinine 0.57 - 1.00 mg/dL 9.25  9.11  8.89   Sodium 134 - 144 mmol/L 140  135  138   Potassium 3.5 - 5.2 mmol/L 4.5  4.1  3.8   Chloride 96 - 106 mmol/L 102  102  106   CO2 20 - 29 mmol/L 22  24  27    Calcium 8.7 - 10.2 mg/dL 9.8  9.0  9.2   Total Protein 6.0 - 8.5 g/dL 7.0  7.6  7.1   Total Bilirubin 0.0 - 1.2 mg/dL <9.7  0.6  0.3   Alkaline Phos 44 - 121 IU/L 76  52  63   AST 0 - 40 IU/L 13  16  15    ALT 0 - 32 IU/L 19  19  21     Lipid Panel     Component Value Date/Time   CHOL 136 08/06/2022 1840   TRIG 266 (H) 08/06/2022 1840   HDL 23 (L) 08/06/2022 1840    CHOLHDL 5.9 08/06/2022 1840   VLDL 53 (H) 08/06/2022 1840   LDLCALC 60 08/06/2022 1840   LDLDIRECT 74.6 02/11/2022 1325    CBC    Component Value Date/Time   WBC 7.7 11/22/2022 1430   RBC 5.10 11/22/2022 1430   HGB 14.3 11/22/2022 1430   HCT 43.0 11/22/2022 1430   PLT 275 11/22/2022 1430   MCV 84.3 11/22/2022 1430   MCH 28.0 11/22/2022 1430   MCHC 33.3 11/22/2022 1430   RDW 13.3 11/22/2022 1430   LYMPHSABS 4.3 (H) 08/06/2022 1840   MONOABS 0.7 08/06/2022 1840   EOSABS 0.3 08/06/2022 1840   BASOSABS 0.1 08/06/2022 1840    ASSESSMENT AND PLAN:  Assessment and Plan  1. Type 2 diabetes mellitus with morbid obesity (HCC) (Primary) Not at goal. A1c has significantly improved since 02/2024. We agreed to increase Ozempic  2mg  to help in getting A1c below 7.0 and assist with further weight loss. Commended her on her healthier eating habits and increase in exercise. She is due for labs but is scared of needles and would prefer to come back and do them on 07/31/2024. Submitted a referral for her diabetic eye exam. - Continue Lantus  insulin  60 units daily and metformin  500 mg daily.  - Increase Ozempic  to 2mg  once weekly, told the patient to call if she develops any side effects like vomiting, abdominal pain or severe diarrhea with the increased dose. - POCT glycosylated hemoglobin (  Hb A1C) - POCT glucose (manual entry) - Ambulatory referral to Ophthalmology - Semaglutide , 2 MG/DOSE, 8 MG/3ML SOPN; Inject 2 mg as directed once a week.  Dispense: 3 mL; Refill: 4 - Comprehensive metabolic panel; Future - Lipid Panel; Future  2. Type 2 diabetes mellitus treated with insulin  (HCC) 3. Diabetes mellitus treated with injections of non-insulin  medication (HCC) 4. Diabetes mellitus treated with oral medication (HCC) #1 as above.   5. Hypertension associated with type 2 diabetes mellitus (HCC) Not at goal. Pt has medicaid so will send her a prescription for a BP cuff so she can check her BP 2x  weekly and bring the readings to the next visit. Told her if the diastolic readings remain consistently above 80, she should call me and we will increase valsartan  dose prior to her next visit -- which she agrees to. Provided her with this address: [address] [place], [place] [address] and Phone # [phone] to pick up the cuff. - Continue Valsartan  40 mg daily - Blood Pressure Monitoring (BLOOD PRESSURE CUFF) MISC; Check you blood pressure at least twice a week  Dispense: 1 each; Refill: 0 - CBC; Future  6. Influenza vaccination declined  7. Pneumococcal vaccination declined  8. Declined hepatitis B immunization   Patient was given the opportunity to ask questions.  Patient verbalized understanding of the plan and was able to repeat key elements of the plan.    Orders Placed This Encounter  Procedures   Comprehensive metabolic panel   CBC   Lipid Panel   Ambulatory referral to Ophthalmology   POCT glycosylated hemoglobin (Hb A1C)   POCT glucose (manual entry)     Requested Prescriptions   Signed Prescriptions Disp Refills   Semaglutide , 2 MG/DOSE, 8 MG/3ML SOPN 3 mL 4    Sig: Inject 2 mg as directed once a week.   Blood Pressure Monitoring (BLOOD PRESSURE CUFF) MISC 1 each 0    Sig: Check you blood pressure at least twice a week    Return in about 4 months (around 11/26/2024) for lab appt for monday.  This is a Psychologist, occupational Note.  The care of the patient was discussed with Dr. Vicci and the assessment and plan formulated with her assistance.  Please see her attestation of this encounter.  Duwaine Amber, MS3 [facility]  Evaluation and management procedures were performed by me with Medical Student in attendance, note written by Medical Student under my supervision and collaboration. I have reviewed the note and I agree with the management and plan.  Barnie Vicci, MD, FAAFP. [facility] [place], [place] [phone]   07/27/2024, 5:23  PM

## 2024-07-27 NOTE — Patient Instructions (Addendum)
-   Please pick up your blood pressure cuff at [facility] Address: [address], [place], [place] [address] Phone: [phone]  - Check you blood pressure at least twice a week. Bring your readings with you to your follow up visit. We want that bottom number to be below 80. If your blood pressures are running higher than 80s, you can call me and we will increase the Valsartan  dose.   - We will increase the Ozempic  to 2 mg once weekly subcutaneously   - We have submitted a referral for your eye exam

## 2024-07-31 ENCOUNTER — Other Ambulatory Visit: Payer: MEDICAID

## 2024-08-01 ENCOUNTER — Telehealth: Payer: Self-pay

## 2024-08-01 NOTE — Telephone Encounter (Signed)
 Copied from CRM #[id number]. Topic: Clinical - Medication Question >> Aug 01, 2024  9:06 AM Mia F wrote: Reason for CRM: Pt was checking to see if she was supposed to start the Semaglutide , 2 MG/DOSE, 8 MG/3ML SOPN  this week. It was confirmed that rx was sent to the pharmacy. Pt was not aware, She wants to confirm the dosage she is to take. Please advise

## 2024-08-01 NOTE — Telephone Encounter (Signed)
 Yes.  The goal was to increase the Ozempic  from 1 mg once a week to 2 mg once a week.

## 2024-08-02 NOTE — Telephone Encounter (Signed)
 Called & spoke to the patient. Verified name & DOB. Informed that it is ok to increase Ozempic  to 2 mg per Dr.Johnson. Patient is inquiring what to do with the previous pen now that she has been increased to 2mg . Please advise.

## 2024-08-02 NOTE — Telephone Encounter (Signed)
 Called but no answer. LVM to call back.

## 2024-08-08 NOTE — Telephone Encounter (Signed)
 Called but no answer. LVM to call back.

## 2024-10-12 ENCOUNTER — Other Ambulatory Visit: Payer: Self-pay | Admitting: Internal Medicine

## 2024-10-12 DIAGNOSIS — E1169 Type 2 diabetes mellitus with other specified complication: Secondary | ICD-10-CM

## 2024-10-12 NOTE — Telephone Encounter (Unsigned)
 Copied from CRM #[id number]. Topic: Clinical - Medication Refill >> Oct 12, 2024  4:28 PM Nathanel BROCKS wrote: Medication: metFORMIN  (GLUCOPHAGE -XR) 500 MG 24 hr tablet [ insulin  glargine (LANTUS ) 100 UNIT/ML Solostar Pen   Has the patient contacted their pharmacy? Yes   This is the patient's preferred pharmacy:  [facility] [address], [place] - [address] [place] [address] Phone: [phone] Fax: [phone]  Is this the correct pharmacy for this prescription? Yes If no, delete pharmacy and type the correct one.   Has the prescription been filled recently? Yes  Is the patient out of the medication? Yes  Has the patient been seen for an appointment in the last year OR does the patient have an upcoming appointment? Yes  Can we respond through MyChart? Yes  Agent: Please be advised that Rx refills may take up to 3 business days. We ask that you follow-up with your pharmacy.

## 2024-10-13 MED ORDER — METFORMIN HCL ER 500 MG PO TB24: 500.0000 mg | ORAL_TABLET | Freq: Every day | ORAL | 1 refills | Status: AC

## 2024-10-13 MED ORDER — INSULIN GLARGINE 100 UNIT/ML SOLOSTAR PEN: 60.0000 [IU] | PEN_INJECTOR | Freq: Every day | SUBCUTANEOUS | 1 refills | Status: AC

## 2024-11-21 ENCOUNTER — Other Ambulatory Visit: Payer: Self-pay

## 2024-11-23 ENCOUNTER — Ambulatory Visit: Payer: Self-pay

## 2024-11-23 NOTE — Telephone Encounter (Signed)
 3rd call back attempt. LVM to call office back.    Copied from CRM #[id number]. Topic: Clinical - Medical Advice >> Nov 23, 2024 12:14 PM Donna BRAVO wrote: Reason for CRM: Patient not eat normally due to being sick, glucose number is down. Reading 97 at 11:30am  Patient would like to known if this is an okay number.  Patient has no other symptoms

## 2024-11-23 NOTE — Telephone Encounter (Signed)
 Copied from CRM #[id number]. Topic: Clinical - Medical Advice >> Nov 23, 2024 12:14 PM Donna BRAVO wrote: Reason for CRM: Patient not eat normally due to being sick, glucose number is down. Reading 97 at 11:30am  Patient would like to known if this is an okay number.  Patient has no other symptoms

## 2024-11-23 NOTE — Telephone Encounter (Signed)
 Call to patient not answer. Message left

## 2024-11-24 ENCOUNTER — Other Ambulatory Visit: Payer: Self-pay

## 2024-11-24 ENCOUNTER — Telehealth: Payer: Self-pay

## 2024-11-24 NOTE — Telephone Encounter (Signed)
 Pharmacy Patient Advocate Encounter  Received notification from [facility] MEDICAID that Prior Authorization for OZEMPIC  has been APPROVED from 11/21/2024 to 11/21/2025

## 2024-11-27 ENCOUNTER — Ambulatory Visit: Payer: MEDICAID | Admitting: Internal Medicine

## 2024-12-28 ENCOUNTER — Ambulatory Visit: Payer: MEDICAID | Admitting: Internal Medicine
